# Patient Record
Sex: Female | Born: 1935 | Race: White | Hispanic: No | Marital: Married | Smoking: Never smoker
Health system: Southern US, Community
[De-identification: ages and names within clinical notes are randomized; demographics above are authoritative.]

## PROBLEM LIST (undated history)

## (undated) DIAGNOSIS — Z950 Presence of cardiac pacemaker: Secondary | ICD-10-CM

## (undated) DIAGNOSIS — R011 Cardiac murmur, unspecified: Secondary | ICD-10-CM

## (undated) DIAGNOSIS — I442 Atrioventricular block, complete: Secondary | ICD-10-CM

## (undated) DIAGNOSIS — I10 Essential (primary) hypertension: Secondary | ICD-10-CM

## (undated) DIAGNOSIS — M1990 Unspecified osteoarthritis, unspecified site: Secondary | ICD-10-CM

## (undated) HISTORY — DX: Presence of cardiac pacemaker: Z95.0

## (undated) HISTORY — DX: Atrioventricular block, complete: I44.2

## (undated) HISTORY — PX: ROTATOR CUFF REPAIR: SHX139

## (undated) HISTORY — PX: THYROID SURGERY: SHX805

## (undated) HISTORY — PX: CATARACT EXTRACTION, BILATERAL: SHX1313

## (undated) HISTORY — PX: APPENDECTOMY: SHX54

---

## 2006-10-25 ENCOUNTER — Telehealth: Payer: Self-pay | Admitting: *Deleted

## 2006-10-25 ENCOUNTER — Telehealth: Payer: Self-pay | Admitting: Family Medicine

## 2006-10-31 ENCOUNTER — Ambulatory Visit: Payer: Self-pay | Admitting: Family Medicine

## 2006-10-31 DIAGNOSIS — I10 Essential (primary) hypertension: Secondary | ICD-10-CM | POA: Insufficient documentation

## 2006-10-31 DIAGNOSIS — D126 Benign neoplasm of colon, unspecified: Secondary | ICD-10-CM

## 2006-10-31 DIAGNOSIS — E785 Hyperlipidemia, unspecified: Secondary | ICD-10-CM | POA: Insufficient documentation

## 2006-10-31 LAB — CONVERTED CEMR LAB
ALT: 20 units/L (ref 0–35)
AST: 17 units/L (ref 0–37)
Albumin: 4.4 g/dL (ref 3.5–5.2)
Alkaline Phosphatase: 52 units/L (ref 39–117)
Calcium: 10.2 mg/dL (ref 8.4–10.5)
Creatinine, Ser: 0.95 mg/dL (ref 0.40–1.20)
Glucose, Bld: 101 mg/dL — ABNORMAL HIGH (ref 70–99)
Sodium: 139 meq/L (ref 135–145)

## 2006-11-01 ENCOUNTER — Encounter: Payer: Self-pay | Admitting: Family Medicine

## 2006-11-09 ENCOUNTER — Telehealth: Payer: Self-pay | Admitting: *Deleted

## 2006-11-15 ENCOUNTER — Encounter: Payer: Self-pay | Admitting: Family Medicine

## 2007-01-10 ENCOUNTER — Telehealth: Payer: Self-pay | Admitting: Family Medicine

## 2007-02-15 ENCOUNTER — Encounter: Payer: Self-pay | Admitting: Family Medicine

## 2007-02-15 ENCOUNTER — Ambulatory Visit: Payer: Self-pay | Admitting: Family Medicine

## 2007-02-15 LAB — CONVERTED CEMR LAB
ALT: 17 units/L (ref 0–35)
Alkaline Phosphatase: 54 units/L (ref 39–117)
Cholesterol: 202 mg/dL — ABNORMAL HIGH (ref 0–200)
Total Protein: 7.1 g/dL (ref 6.0–8.3)
Triglycerides: 242 mg/dL — ABNORMAL HIGH (ref <150)

## 2007-02-16 ENCOUNTER — Encounter: Payer: Self-pay | Admitting: Family Medicine

## 2007-03-22 ENCOUNTER — Ambulatory Visit: Payer: Self-pay | Admitting: Family Medicine

## 2007-03-22 DIAGNOSIS — IMO0002 Reserved for concepts with insufficient information to code with codable children: Secondary | ICD-10-CM | POA: Insufficient documentation

## 2007-03-22 DIAGNOSIS — M1710 Unilateral primary osteoarthritis, unspecified knee: Secondary | ICD-10-CM

## 2007-06-21 ENCOUNTER — Ambulatory Visit: Payer: Self-pay | Admitting: Family Medicine

## 2007-06-21 DIAGNOSIS — I6529 Occlusion and stenosis of unspecified carotid artery: Secondary | ICD-10-CM | POA: Insufficient documentation

## 2007-06-21 LAB — CONVERTED CEMR LAB
ALT: 16 units/L (ref 0–35)
AST: 15 units/L (ref 0–37)
Alkaline Phosphatase: 52 units/L (ref 39–117)
CO2: 25 meq/L (ref 19–32)
Chloride: 102 meq/L (ref 96–112)
Creatinine, Ser: 0.91 mg/dL (ref 0.40–1.20)
Total Bilirubin: 0.3 mg/dL (ref 0.3–1.2)

## 2007-06-22 ENCOUNTER — Encounter: Payer: Self-pay | Admitting: Family Medicine

## 2007-06-23 ENCOUNTER — Encounter: Payer: Self-pay | Admitting: Family Medicine

## 2007-06-26 ENCOUNTER — Encounter: Admission: RE | Admit: 2007-06-26 | Discharge: 2007-06-26 | Payer: Self-pay | Admitting: Family Medicine

## 2007-07-04 ENCOUNTER — Encounter: Payer: Self-pay | Admitting: Family Medicine

## 2007-07-20 ENCOUNTER — Telehealth: Payer: Self-pay | Admitting: Family Medicine

## 2007-07-21 ENCOUNTER — Ambulatory Visit: Payer: Self-pay | Admitting: Family Medicine

## 2007-07-21 ENCOUNTER — Encounter: Payer: Self-pay | Admitting: Family Medicine

## 2007-07-24 ENCOUNTER — Telehealth (INDEPENDENT_AMBULATORY_CARE_PROVIDER_SITE_OTHER): Payer: Self-pay | Admitting: Family Medicine

## 2007-08-21 ENCOUNTER — Ambulatory Visit: Payer: Self-pay | Admitting: Family Medicine

## 2007-08-21 ENCOUNTER — Telehealth: Payer: Self-pay | Admitting: *Deleted

## 2007-08-21 DIAGNOSIS — E049 Nontoxic goiter, unspecified: Secondary | ICD-10-CM | POA: Insufficient documentation

## 2007-08-22 ENCOUNTER — Encounter: Admission: RE | Admit: 2007-08-22 | Discharge: 2007-08-22 | Payer: Self-pay | Admitting: Family Medicine

## 2007-10-23 ENCOUNTER — Encounter: Payer: Self-pay | Admitting: Family Medicine

## 2007-10-23 ENCOUNTER — Ambulatory Visit: Payer: Self-pay | Admitting: Family Medicine

## 2007-10-23 LAB — CONVERTED CEMR LAB
ALT: 20 units/L (ref 0–35)
Albumin: 4 g/dL (ref 3.5–5.2)
Bilirubin, Direct: 0.1 mg/dL (ref 0.0–0.3)
Total Bilirubin: 0.4 mg/dL (ref 0.3–1.2)
Total Protein: 6.8 g/dL (ref 6.0–8.3)

## 2007-10-30 ENCOUNTER — Telehealth: Payer: Self-pay | Admitting: Family Medicine

## 2007-10-30 ENCOUNTER — Encounter: Payer: Self-pay | Admitting: Family Medicine

## 2007-12-04 ENCOUNTER — Encounter: Payer: Self-pay | Admitting: Family Medicine

## 2008-02-01 ENCOUNTER — Telehealth: Payer: Self-pay | Admitting: *Deleted

## 2008-02-14 ENCOUNTER — Ambulatory Visit: Payer: Self-pay | Admitting: Family Medicine

## 2008-02-15 ENCOUNTER — Encounter: Payer: Self-pay | Admitting: Family Medicine

## 2008-06-05 ENCOUNTER — Ambulatory Visit: Payer: Self-pay | Admitting: Family Medicine

## 2008-06-05 LAB — CONVERTED CEMR LAB
ALT: 26 units/L (ref 0–35)
AST: 19 units/L (ref 0–37)
CO2: 22 meq/L (ref 19–32)
Chloride: 102 meq/L (ref 96–112)
Creatinine, Ser: 0.89 mg/dL (ref 0.40–1.20)
LDL Cholesterol: 123 mg/dL — ABNORMAL HIGH (ref 0–99)
Potassium: 4.5 meq/L (ref 3.5–5.3)
Sodium: 141 meq/L (ref 135–145)
Total CHOL/HDL Ratio: 4.6
Total Protein: 7.9 g/dL (ref 6.0–8.3)
Triglycerides: 269 mg/dL — ABNORMAL HIGH (ref <150)
VLDL: 54 mg/dL — ABNORMAL HIGH (ref 0–40)

## 2008-06-07 ENCOUNTER — Encounter: Payer: Self-pay | Admitting: Family Medicine

## 2008-06-13 ENCOUNTER — Encounter: Payer: Self-pay | Admitting: Family Medicine

## 2008-06-13 ENCOUNTER — Encounter: Admission: RE | Admit: 2008-06-13 | Discharge: 2008-06-13 | Payer: Self-pay | Admitting: Family Medicine

## 2008-06-18 ENCOUNTER — Encounter: Payer: Self-pay | Admitting: Family Medicine

## 2008-07-09 ENCOUNTER — Other Ambulatory Visit: Admission: RE | Admit: 2008-07-09 | Discharge: 2008-07-09 | Payer: Self-pay | Admitting: Interventional Radiology

## 2008-07-09 ENCOUNTER — Encounter (INDEPENDENT_AMBULATORY_CARE_PROVIDER_SITE_OTHER): Payer: Self-pay | Admitting: Interventional Radiology

## 2008-07-09 ENCOUNTER — Encounter: Admission: RE | Admit: 2008-07-09 | Discharge: 2008-07-09 | Payer: Self-pay | Admitting: Otolaryngology

## 2008-08-09 ENCOUNTER — Telehealth: Payer: Self-pay | Admitting: *Deleted

## 2008-08-12 ENCOUNTER — Ambulatory Visit: Payer: Self-pay | Admitting: Family Medicine

## 2008-10-04 ENCOUNTER — Telehealth: Payer: Self-pay | Admitting: *Deleted

## 2008-10-07 ENCOUNTER — Ambulatory Visit: Payer: Self-pay | Admitting: Family Medicine

## 2008-10-07 ENCOUNTER — Telehealth: Payer: Self-pay | Admitting: Family Medicine

## 2008-10-08 ENCOUNTER — Telehealth: Payer: Self-pay | Admitting: Family Medicine

## 2008-10-14 ENCOUNTER — Encounter: Payer: Self-pay | Admitting: Family Medicine

## 2008-11-01 ENCOUNTER — Encounter: Admission: RE | Admit: 2008-11-01 | Discharge: 2008-11-01 | Payer: Self-pay | Admitting: Family Medicine

## 2008-12-02 ENCOUNTER — Ambulatory Visit: Payer: Self-pay | Admitting: Family Medicine

## 2008-12-24 ENCOUNTER — Telehealth (INDEPENDENT_AMBULATORY_CARE_PROVIDER_SITE_OTHER): Payer: Self-pay | Admitting: *Deleted

## 2008-12-24 ENCOUNTER — Ambulatory Visit: Payer: Self-pay | Admitting: Family Medicine

## 2009-01-10 ENCOUNTER — Telehealth (INDEPENDENT_AMBULATORY_CARE_PROVIDER_SITE_OTHER): Payer: Self-pay | Admitting: *Deleted

## 2009-01-17 ENCOUNTER — Telehealth (INDEPENDENT_AMBULATORY_CARE_PROVIDER_SITE_OTHER): Payer: Self-pay | Admitting: *Deleted

## 2009-01-21 ENCOUNTER — Ambulatory Visit: Payer: Self-pay | Admitting: Family Medicine

## 2009-01-22 ENCOUNTER — Encounter: Payer: Self-pay | Admitting: Family Medicine

## 2009-01-23 ENCOUNTER — Ambulatory Visit: Payer: Self-pay | Admitting: Family Medicine

## 2009-01-24 ENCOUNTER — Encounter: Payer: Self-pay | Admitting: Family Medicine

## 2009-01-24 LAB — CONVERTED CEMR LAB
BUN: 28 mg/dL — ABNORMAL HIGH (ref 6–23)
Potassium: 4 meq/L (ref 3.5–5.3)
Sodium: 140 meq/L (ref 135–145)

## 2009-01-27 ENCOUNTER — Ambulatory Visit (HOSPITAL_COMMUNITY): Admission: RE | Admit: 2009-01-27 | Discharge: 2009-01-27 | Payer: Self-pay | Admitting: Family Medicine

## 2009-01-31 ENCOUNTER — Telehealth (INDEPENDENT_AMBULATORY_CARE_PROVIDER_SITE_OTHER): Payer: Self-pay | Admitting: *Deleted

## 2009-01-31 DIAGNOSIS — N83209 Unspecified ovarian cyst, unspecified side: Secondary | ICD-10-CM | POA: Insufficient documentation

## 2009-02-07 ENCOUNTER — Ambulatory Visit: Payer: Self-pay | Admitting: Family Medicine

## 2009-03-17 ENCOUNTER — Ambulatory Visit: Payer: Self-pay | Admitting: Family Medicine

## 2009-04-23 ENCOUNTER — Ambulatory Visit: Payer: Self-pay | Admitting: Obstetrics & Gynecology

## 2009-05-07 ENCOUNTER — Ambulatory Visit (HOSPITAL_COMMUNITY): Admission: RE | Admit: 2009-05-07 | Discharge: 2009-05-07 | Payer: Self-pay | Admitting: Obstetrics & Gynecology

## 2009-05-14 ENCOUNTER — Ambulatory Visit: Payer: Self-pay | Admitting: Obstetrics and Gynecology

## 2009-05-22 ENCOUNTER — Telehealth: Payer: Self-pay | Admitting: *Deleted

## 2009-05-23 ENCOUNTER — Ambulatory Visit: Payer: Self-pay | Admitting: Sports Medicine

## 2009-06-30 ENCOUNTER — Ambulatory Visit: Payer: Self-pay | Admitting: Family Medicine

## 2009-06-30 ENCOUNTER — Encounter: Payer: Self-pay | Admitting: Family Medicine

## 2009-06-30 LAB — CONVERTED CEMR LAB
BUN: 29 mg/dL — ABNORMAL HIGH (ref 6–23)
CO2: 31 meq/L (ref 19–32)
Cholesterol: 194 mg/dL (ref 0–200)
Creatinine, Ser: 0.89 mg/dL (ref 0.40–1.20)
Glucose, Bld: 76 mg/dL (ref 70–99)
HCT: 38.6 % (ref 36.0–46.0)
LDL Cholesterol: 103 mg/dL — ABNORMAL HIGH (ref 0–99)
MCV: 95.5 fL (ref 78.0–100.0)
Platelets: 460 10*3/uL — ABNORMAL HIGH (ref 150–400)
Total Bilirubin: 0.3 mg/dL (ref 0.3–1.2)
Total CHOL/HDL Ratio: 3.3
Triglycerides: 165 mg/dL — ABNORMAL HIGH (ref <150)
VLDL: 33 mg/dL (ref 0–40)

## 2009-07-03 ENCOUNTER — Encounter: Payer: Self-pay | Admitting: Family Medicine

## 2009-07-16 ENCOUNTER — Ambulatory Visit: Payer: Self-pay | Admitting: Family Medicine

## 2009-07-16 DIAGNOSIS — G4700 Insomnia, unspecified: Secondary | ICD-10-CM

## 2009-08-27 ENCOUNTER — Ambulatory Visit: Payer: Self-pay | Admitting: Family Medicine

## 2009-08-27 ENCOUNTER — Telehealth: Payer: Self-pay | Admitting: *Deleted

## 2009-08-27 IMAGING — US US BIOPSY
1 series · 11 of 11 positions shown · non-contrast
Comparison: none

Clinical Data/Indication: Thyroid nodules

ULTRASOUND-GUIDED BIOPSYDOMINANT RIGHT THYROID NODULE.  FINE NEEDLE
ASPIRATION.
Procedure: The procedure, risks, benefits, and alternatives were
explained to the patient. Questions regarding the procedure were
encouraged and answered. The patient understands and consents to
the procedure.
The right neck was prepped withbetadine in a sterile fasion, and a
sterile drape was applied covering the operative field. A sterile
gown and sterile gloves were used for the procedure.
Under sonographic guidance, three 25 gauge fine needle aspirates of
the dominant right thyroid nodule were obtained. Final imaging was
performed.

[Series 1: us biopsy · 0.08mm/px · 11 acquisitions, 11 frames shown]
[im 1/11]
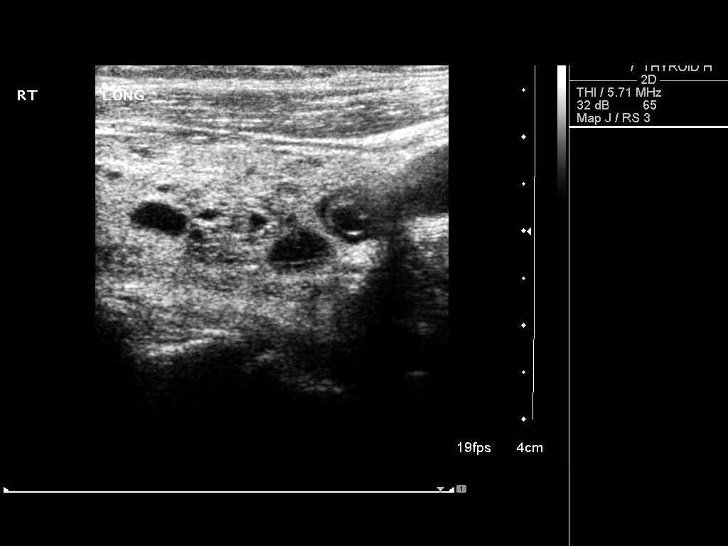
[im 2/11]
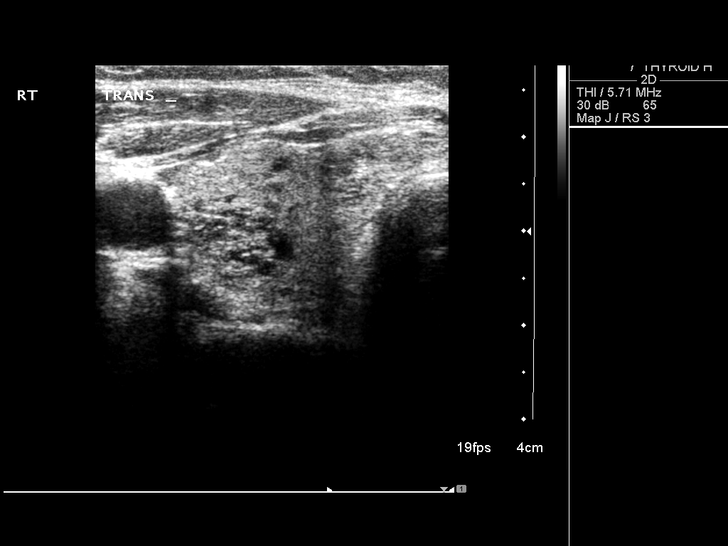
[im 3/11]
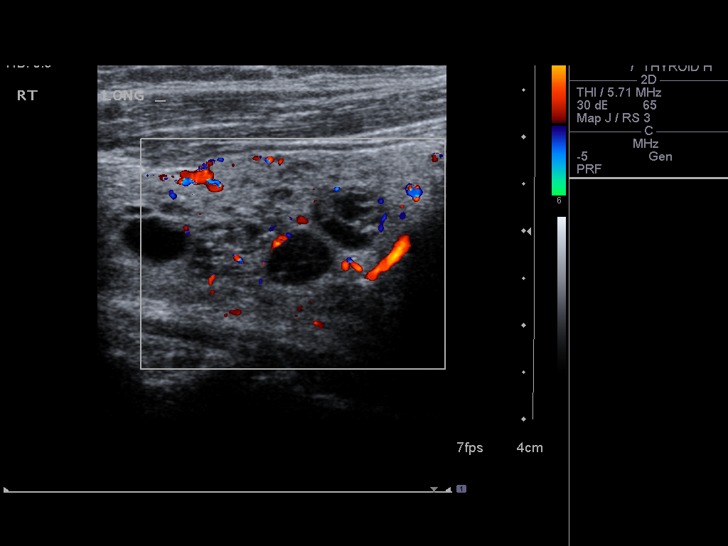
[im 4/11]
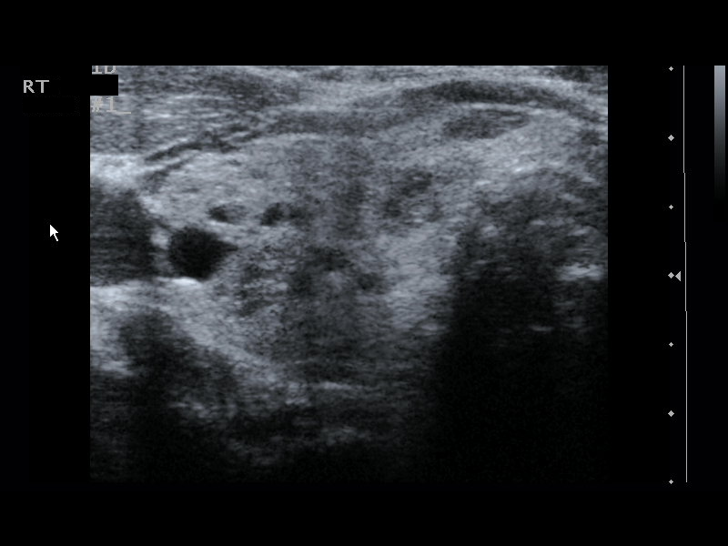
[im 5/11]
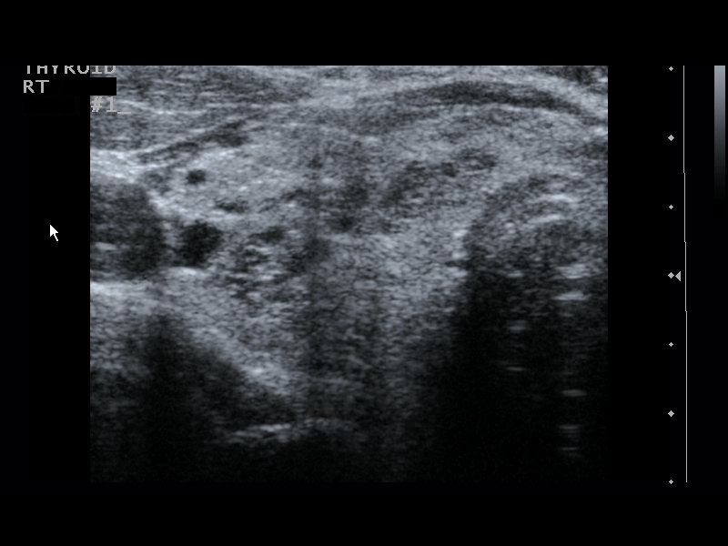
[im 6/11]
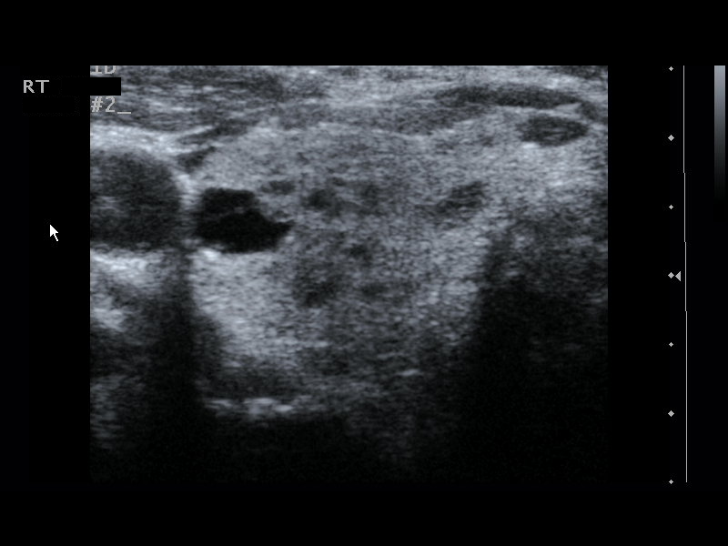
[im 7/11]
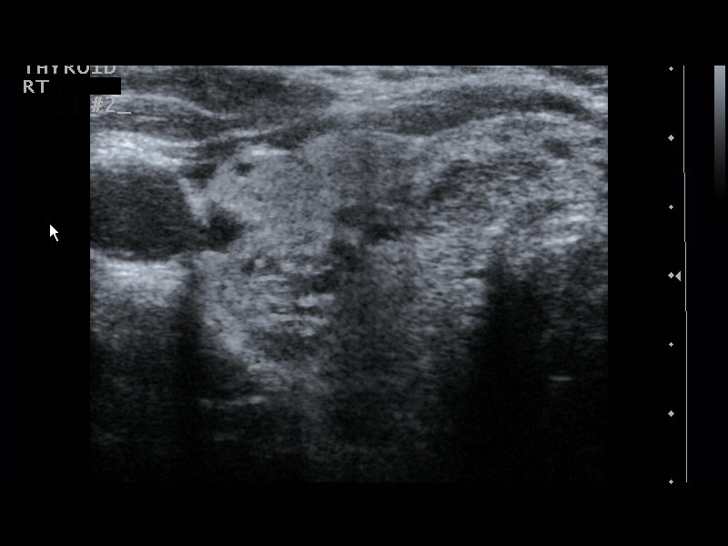
[im 8/11]
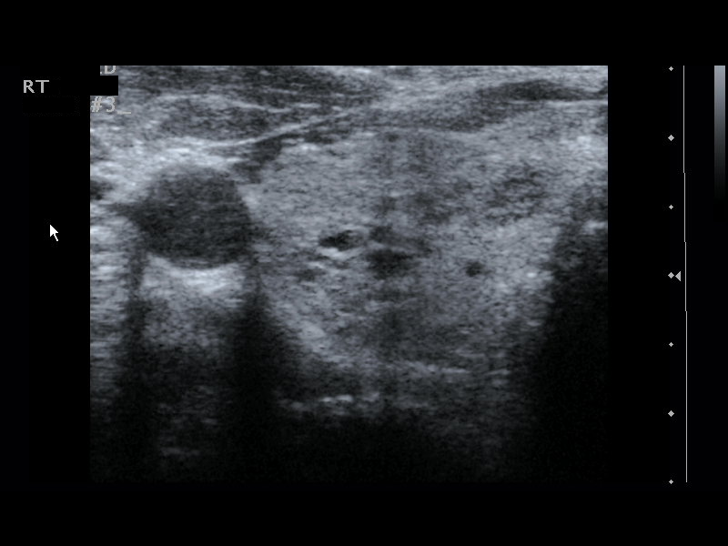
[im 9/11]
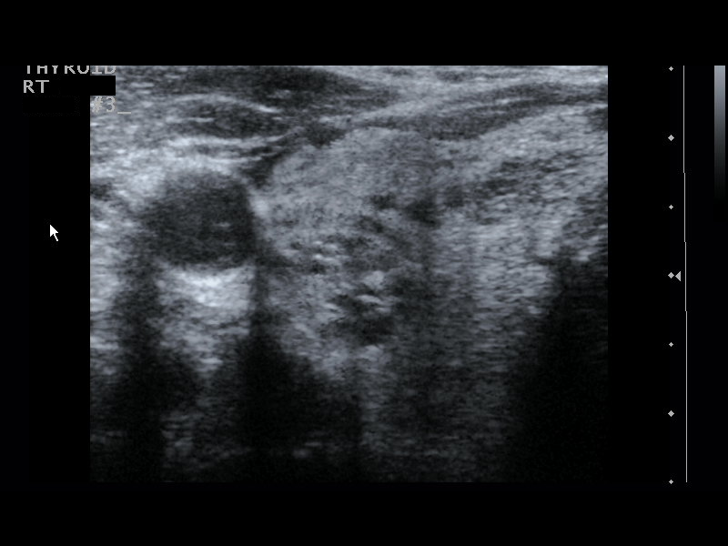
[im 10/11]
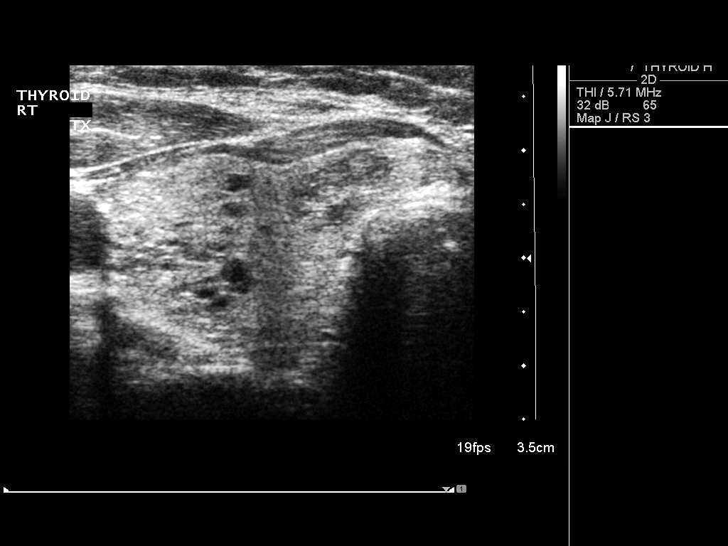
[im 11/11]
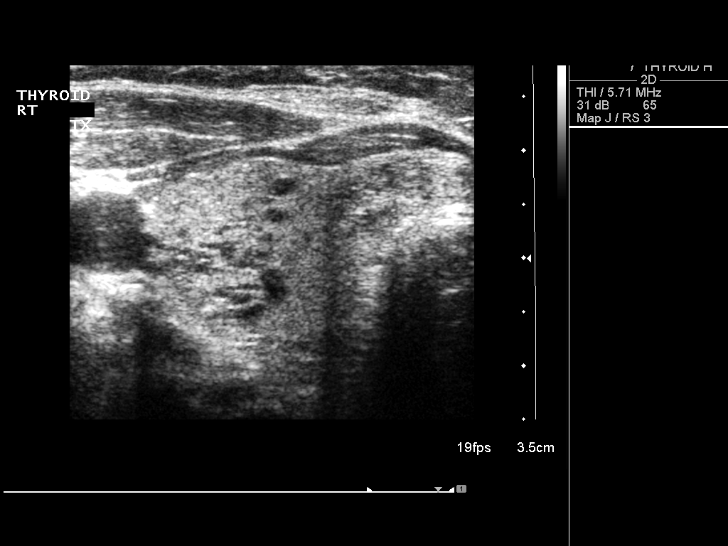

[11 of 11 positions shown; findings below may reference images not displayed]

FINDINGS: The images document guide needle placement within the
dominant right thyroid nodule. Post biopsy images demonstrate no
hemorrhage.
IMPRESSION: Successful ultrasound-guided fine needle aspiration of a dominant
right thyroid nodule.

## 2009-09-15 ENCOUNTER — Ambulatory Visit: Payer: Self-pay | Admitting: Family Medicine

## 2009-09-26 ENCOUNTER — Ambulatory Visit (HOSPITAL_COMMUNITY): Admission: RE | Admit: 2009-09-26 | Discharge: 2009-09-26 | Payer: Self-pay | Admitting: Family Medicine

## 2009-09-30 ENCOUNTER — Encounter: Payer: Self-pay | Admitting: Family Medicine

## 2009-10-06 ENCOUNTER — Telehealth: Payer: Self-pay | Admitting: *Deleted

## 2009-11-17 ENCOUNTER — Ambulatory Visit: Payer: Self-pay | Admitting: Family Medicine

## 2009-12-22 ENCOUNTER — Encounter: Payer: Self-pay | Admitting: Family Medicine

## 2009-12-26 ENCOUNTER — Inpatient Hospital Stay (HOSPITAL_COMMUNITY): Admission: RE | Admit: 2009-12-26 | Discharge: 2009-12-29 | Payer: Self-pay | Admitting: Orthopedic Surgery

## 2010-01-08 ENCOUNTER — Encounter: Payer: Self-pay | Admitting: *Deleted

## 2010-01-09 ENCOUNTER — Telehealth: Payer: Self-pay | Admitting: Family Medicine

## 2010-01-12 ENCOUNTER — Encounter: Payer: Self-pay | Admitting: Family Medicine

## 2010-02-16 ENCOUNTER — Ambulatory Visit: Payer: Self-pay | Admitting: Family Medicine

## 2010-03-04 ENCOUNTER — Telehealth (INDEPENDENT_AMBULATORY_CARE_PROVIDER_SITE_OTHER): Payer: Self-pay | Admitting: *Deleted

## 2010-03-11 ENCOUNTER — Ambulatory Visit: Payer: Self-pay | Admitting: Family Medicine

## 2010-03-11 LAB — CONVERTED CEMR LAB
BUN: 27 mg/dL — ABNORMAL HIGH (ref 6–23)
Basophils Absolute: 0 10*3/uL (ref 0.0–0.1)
CO2: 28 meq/L (ref 19–32)
Calcium: 11 mg/dL — ABNORMAL HIGH (ref 8.4–10.5)
Chloride: 103 meq/L (ref 96–112)
Creatinine, Ser: 0.89 mg/dL (ref 0.40–1.20)
HCT: 39.7 % (ref 36.0–46.0)
Hemoglobin: 11.2 g/dL — ABNORMAL LOW (ref 12.0–15.0)
Lymphocytes Relative: 34 % (ref 12–46)
Lymphs Abs: 3.1 10*3/uL (ref 0.7–4.0)
Monocytes Absolute: 0.9 10*3/uL (ref 0.1–1.0)
Potassium: 4.6 meq/L (ref 3.5–5.3)
RBC: 3.75 M/uL — ABNORMAL LOW (ref 3.87–5.11)
TSH: 2.097 microintl units/mL (ref 0.350–4.500)
WBC: 9 10*3/uL (ref 4.0–10.5)

## 2010-03-18 ENCOUNTER — Encounter: Payer: Self-pay | Admitting: Family Medicine

## 2010-03-19 ENCOUNTER — Encounter: Payer: Self-pay | Admitting: Family Medicine

## 2010-05-27 ENCOUNTER — Encounter
Admission: RE | Admit: 2010-05-27 | Discharge: 2010-05-27 | Payer: Self-pay | Source: Home / Self Care | Attending: Family Medicine | Admitting: Family Medicine

## 2010-06-03 NOTE — Assessment & Plan Note (Signed)
Summary: flu shot,df  Nurse Visit   Vital Signs:  Patient profile:   77 year old female Temp:     12 degrees F  Vitals Entered By: Theresia Lo RN (February 16, 2010 9:25 AM)  Allergies: No Known Drug Allergies  Immunizations Administered:  Influenza Vaccine # 1:    Vaccine Type: Fluvax MCR    Site: right deltoid    Mfr: [employer]    Dose: 0.5 ml    Route: IM    Given by: Theresia Lo RN    Exp. Date: 10/28/2010    Lot #: [id number]    VIS given: 11/25/09 version given February 16, 2010.  Flu Vaccine Consent Questions:    Do you have a history of severe allergic reactions to this vaccine? no    Any prior history of allergic reactions to egg and/or gelatin? no    Do you have a sensitivity to the preservative Thimersol? no    Do you have a past history of Guillan-Barre Syndrome? no    Do you currently have an acute febrile illness? no    Have you ever had a severe reaction to latex? no    Vaccine information given and explained to patient? yes    Are you currently pregnant? no  Orders Added: 1)  Influenza Vaccine MCR [00025] 2)  Administration Flu vaccine - MCR [G0008]

## 2010-06-03 NOTE — Assessment & Plan Note (Signed)
Summary: R/L KNEE COT SHOT,MC   Vital Signs:  Patient profile:   77 year old female Height:      65 inches Weight:      156 pounds BMI:     26.05 Pulse rate:   65 / minute BP sitting:   153 / 74  (left arm)  Vitals Entered By: Terese Door (May 23, 2009 10:34 AM) CC: Bilaterl knee injections Is Patient Diabetic? No Pain Assessment Patient in pain? yes     Location: knee Intensity: 5 right knee/8 left knee   CC:  Bilaterl knee injections.  History of Present Illness: B OA knee pain worse over last 1 week. Did help her grand-daughter move to a new apartmment and was up and down stairs a lot. Last 2 days have been worst--difficulty rising from chair because of excrusiating pain everything else is going well  saw the OBGYN to eval her ovarian cyst and has f/u  in one year.  Habits & Providers  Alcohol-Tobacco-Diet     Tobacco Status: never  Allergies: No Known Drug Allergies  Physical Exam  Msk:  B knees have external changes of OA--and the left seems to have larger amount of synovitis present than before. B medial joint line tenderness. no effusion. Lacks 3-5 degrees of full extension on left, full on right. Additional Exam:  Patient given informed consent for injection. Discussed possible complications of infection, bleeding or skin atrophy at site of injection. Possible side effect of avascular necrosis (focal area of bone death) due to steroid use.Appropriate verbal time out taken Are cleaned and prepped in usual sterile fashion. A ---1- cc kennalog plus --4--cc 1% lidocaine without epinephrine was injected into each knee using the anterior approach---. Patient tolerated procedure well with no complications.    Impression & Recommendations:  Problem # 1:  DEGENERATIVE JOINT DISEASE, KNEE (ICD-715.96) Assessment Deteriorated  B injection therapy today. I think she should probably take it easy on them next couple of days--her synovium seems pretty inflamed esp on  left. She agrrees  Orders: Joint Aspirate / Injection, Large (20610)  Problem # 2:  OVARIAN CYSTIC MASS (ICD-620.2) Assessment: Unchanged OBGYN said just repeat US one year  Complete Medication List: 1)  Pravachol 40 Mg Tabs (Pravastatin sodium) .Marland Kitchen.. 1 by mouth once daily 2)  Vicodin 5-500 Mg Tabs (Hydrocodone-acetaminophen) .... 2 by mouth two times a day as needed hip pain or colon spasm 3)  Anacin 81 Mg Tbec (Aspirin) .... Once daily 4)  Hydrochlorothiazide 25 Mg Tabs (Hydrochlorothiazide) .... Once daily 5)  Naprosyn 500 Mg Tabs (Naproxen) .Marland Kitchen.. 1 two times a day pc prn 6)  Altace 10 Mg Tabs (Ramipril) .... Take one tab two times a day 7)  Flexeril 10 Mg Tabs (Cyclobenzaprine hcl) .... Take one  half to one whole tablet twice as needed a day  for back pain

## 2010-06-03 NOTE — Letter (Signed)
Summary: LAB Letter  [facility]  [address]   [place], [place] [address]   Phone: [phone]  Fax: [phone]    07/03/2009  [facility] [address] [place], [place]  [address]  Dear Ms. Royce,   YOur LDL cholesterol is 103 which is PERFECT. All of the  other labs including blood sugar, kidney function, liver function, electrolytes and thyroid were normal. GREAT!       Sincerely,   Denny Levy MD  Appended Document: LAB Letter mailed.

## 2010-06-03 NOTE — Letter (Signed)
Summary: [facility]  [address]   [place], [place] [address]   Phone: [phone]  Fax: [phone]    09/30/2009  [facility] [address] [place], [place]  [address]  Dear Ms. Mccormack,  Your knee XRAYS show what we expected---very severe osteoarthritis. the left knee is much worse.   I think we do need to have you seen by the orthopedist at your convenience. I cannot remember if you were going to make that appointment or we were. If indeed it was something I  was going to do--please call and leave me that message. Or--if you have problems making the appointment---sometimes they need referral from your regular doctor. Let me know.  Hope you are well.         Sincerely,   Denny Levy MD  Appended Document: Jillyn Hidden Letter mailed

## 2010-06-03 NOTE — Assessment & Plan Note (Signed)
Summary: bp check/eo  Nurse Visit   patient in office for BP check today because she has been getting higher than usual readings off and on. she generally doesn't pay attention but her daughter was concerned today about readings she got at home earlier today , 180 systolic. she denies any symptoms , states she feels fine. BP checked manually using .regular adult cuff. LA 168/70  RA 172/68 pulse 76.   will notify   Dr. Jennette Kettle. Theresia Lo RN  August 27, 2009 11:45 AM [Vital Signs-4-CCC]   consulted with Dr. Jennette Kettle and she advises as long as no symptoms will continue on current meds. BP reading today similar to reading at last visit. follow up as previously planned. Theresia Lo RN  August 27, 2009 11:52 AM   Allergies: No Known Drug Allergies  Orders Added: 1)  No Charge Patient Arrived (NCPA0) [NCPA0]

## 2010-06-03 NOTE — Assessment & Plan Note (Signed)
Summary: persistant nightime HA/[place]/Zackarie Chason(to see Dr. Jennette Kettle @ 3:00)/bmc   Vital Signs:  Patient profile:   77 year old female Height:      65 inches Weight:      155.4 pounds BMI:     25.95 Temp:     98.2 degrees F oral Pulse rate:   75 / minute BP sitting:   169 / 69  (left arm) Cuff size:   regular  Vitals Entered By: Gladstone Pih (July 16, 2009 3:04 PM)  Serial Vital Signs/Assessments:  Time      Position  BP       Pulse  Resp  Temp     By                     145/82                         Denny Levy MD  CC: HA Is Patient Diabetic? No   CC:  HA.  History of Present Illness: insomnia: long hx of difficulty falling asleep and maintaining sleep. Much worse recently--says she just cannot shut off her mind.  New stressor in that her 59 yo grandson lived w them briefly--his Mom kicked him out--ultimately she had to ask him to leave as well--discipline issues. She feels some guilt for this even tho he appears to be doing oK. Said she would never have had to "give up" on one of herr own 6 kids.  Denies difficulty concentrating, no overt depressive thoughts, not anxious, activity and appetite normal. enjouys her life incl 5 yo grandson.  needs some refills on eiother her BP or cholesterol meds--she cannot remember which. BP up some today. she has felt well, no SOB, no chest pain no dizziness. Has had some headaches, worse on nights w insomnia. no visual changes  Habits & Providers  Alcohol-Tobacco-Diet     Tobacco Status: never  Allergies: No Known Drug Allergies  Review of Systems  The patient denies anorexia, weight loss, weight gain, and abdominal pain.         see hpi  Physical Exam  General:  alert, well-developed, and well-nourished.   Lungs:  normal breath sounds.   Heart:  normal rate and regular rhythm.   Psych:  Oriented X3, memory intact for recent and remote, normally interactive, good eye contact, not anxious appearing, not depressed appearing, and not  agitated.     Impression & Recommendations:  Problem # 1:  INSOMNIA (ICD-780.52) Assessment Deteriorated  I suspect this is related to recent stressors. Disussed options--ambien dod not work well for her--do not want trazodone as it has constipating effects in some and her hx of bowel problems, will try low dose seroquel. she will f/u by phone in 1 week  Orders: [facility]- Est  Level 4 ([address]40)  Problem # 2:  ESSENTIAL HYPERTENSION (ICD-401.9)  Her updated medication list for this problem includes:    Hydrochlorothiazide 25 Mg Tabs (Hydrochlorothiazide) ..... Once daily    Altace 10 Mg Tabs (Ramipril) .Marland Kitchen... Take one tab two times a day  Orders: [facility]- Est  Level 4 (98119) initially elevated today I think secondary to stress, some resolution on recheck no med changes  Problem # 3:  HYPERLIPIDEMIA (ICD-272.4)  Her updated medication list for this problem includes:    Pravachol 40 Mg Tabs (Pravastatin sodium) .Marland Kitchen... 1 by mouth once daily  Orders: [facility]- Est  Level 4 (99214) no med hcanges. reviewed recent labs  Complete Medication List: 1)  Pravachol 40 Mg Tabs (Pravastatin sodium) .Marland Kitchen.. 1 by mouth once daily 2)  Vicodin 5-500 Mg Tabs (Hydrocodone-acetaminophen) .... 2 by mouth two times a day as needed hip pain or colon spasm 3)  Anacin 81 Mg Tbec (Aspirin) .... Once daily 4)  Hydrochlorothiazide 25 Mg Tabs (Hydrochlorothiazide) .... Once daily 5)  Naprosyn 500 Mg Tabs (Naproxen) .Marland Kitchen.. 1 two times a day pc prn 6)  Altace 10 Mg Tabs (Ramipril) .... Take one tab two times a day 7)  Flexeril 10 Mg Tabs (Cyclobenzaprine hcl) .... Take one  half to one whole tablet twice as needed a day  for back pain 8)  Seroquel 25 Mg Tabs (Quetiapine fumarate) .... 1/2- 1 tab by mouth qhs Prescriptions: SEROQUEL 25 MG TABS (QUETIAPINE FUMARATE) 1/2- 1 tab by mouth qhs  #30 x 1   Entered and Authorized by:   Denny Levy MD   Signed by:   Denny Levy MD on 07/16/2009   Method used:   Print then Give to  Patient   RxID:   [id number] ALTACE 10 MG TABS (RAMIPRIL) take one tab two times a day  #180 Each x 3   Entered and Authorized by:   Denny Levy MD   Signed by:   Denny Levy MD on 07/16/2009   Method used:   Print then Give to Patient   RxID:   [id number] HYDROCHLOROTHIAZIDE 25 MG  TABS (HYDROCHLOROTHIAZIDE) once daily  #90 x 3   Entered and Authorized by:   Denny Levy MD   Signed by:   Denny Levy MD on 07/16/2009   Method used:   Print then Give to Patient   RxID:   [id number] PRAVACHOL 40 MG  TABS (PRAVASTATIN SODIUM) 1 by mouth once daily  #90 x 3   Entered and Authorized by:   Denny Levy MD   Signed by:   Denny Levy MD on 07/16/2009   Method used:   Print then Give to Patient   RxID:   [id number] HYDROCHLOROTHIAZIDE 25 MG  TABS (HYDROCHLOROTHIAZIDE) once daily  #90 x 3   Entered and Authorized by:   Denny Levy MD   Signed by:   Denny Levy MD on 07/16/2009   Method used:   Electronically to        [facility] [address] (retail)       [address] [place], [place]  [address]       Ph: [phone] or [phone]       Fax: [phone]   RxID:   [id number] ALTACE 10 MG TABS (RAMIPRIL) take one tab two times a day  #180 Each x 3   Entered and Authorized by:   Denny Levy MD   Signed by:   Denny Levy MD on 07/16/2009   Method used:   Electronically to        [facility] [address] (retail)       [address] [place], [place]  [address]       Ph: [phone] or [phone]       Fax: [phone]   RxID:   [id number] PRAVACHOL 40 MG  TABS (PRAVASTATIN SODIUM) 1 by mouth once daily  #90 x 3   Entered and Authorized by:   Denny Levy MD   Signed  by:   Denny Levy MD on 07/16/2009   Method used:   Electronically to        [facility] [address] (retail)       [address] [place], [place]  [address]       Ph: [phone] or [phone]       Fax: [phone]   RxID:    [id number] SEROQUEL 25 MG TABS (QUETIAPINE FUMARATE) 1/2- 1 tab by mouth qhs  #30 x 1   Entered and Authorized by:   Denny Levy MD   Signed by:   Denny Levy MD on 07/16/2009   Method used:   Electronically to        [facility] [address] (retail)       [address] [place], [place]  [address]       Ph: [phone] or [phone]       Fax: [phone]   RxID:   [id number]

## 2010-06-03 NOTE — Miscellaneous (Signed)
Summary: insommnia  Clinical Lists Changes spouse called about her insommnia. states she has been taking otc meds w/o relief. appt today at 3:30 with Dr. Lelon Perla. husband has 3pm appt with [facility].Golden Circle RN  July 16, 2009 10:32 AM

## 2010-06-03 NOTE — Consult Note (Signed)
Summary: [facility]  [facility]   Imported By: Clydell Hakim 01/21/2010 11:49:27  _____________________________________________________________________  External Attachment:    Type:   Image     Comment:   External Document

## 2010-06-03 NOTE — Assessment & Plan Note (Signed)
Summary: body aches,df   Vital Signs:  Patient profile:   77 year old female Weight:      148.7 pounds Temp:     98 degrees F oral Pulse rate:   87 / minute Pulse rhythm:   regular BP sitting:   163 / 81  (left arm) Cuff size:   regular  Vitals Entered By: Loralee Pacas CMA (March 11, 2010 8:58 AM) CC: follow-up visit Comments pt has been having body aches and has lost her appetite   CC:  follow-up visit.  History of Present Illness: 1) multiple areas of body ache---saysthe only thing that does NOT hurt is her new left knee. that is doing well.  HAs been using her vicodin which helps some.  2) appetite way down---food does not sound good or taste good. This seemed to get significantly worse after her surgery  3) insomnia is better iwth the seroquel and she is less "moody" too.  Habits & Providers  Alcohol-Tobacco-Diet     Tobacco Status: never  Exercise-Depression-Behavior     Does Patient Exercise: no     Exercise Counseling: to improve exercise regimen     Have you felt down or hopeless? no     Have you felt little pleasure in things? no     Depression Counseling: not indicated; screening negative for depression     Seat Belt Use: always  Current Medications (verified): 1)  Vicodin 5-500 Mg  Tabs (Hydrocodone-Acetaminophen) .... 2 By Mouth Three Times A Day As Needed Hip Pain or Colon Spasm 2)  Anacin 81 Mg  Tbec (Aspirin) .... Once Daily 3)  Hydrochlorothiazide 25 Mg  Tabs (Hydrochlorothiazide) .... Once Daily 4)  Naprosyn 500 Mg Tabs (Naproxen) .Marland Kitchen.. 1 Two Times A Day Pc Prn 5)  Altace 10 Mg Tabs (Ramipril) .... Take One Tab Two Times A Day 6)  Flexeril 10 Mg Tabs (Cyclobenzaprine Hcl) .... Take One  Half To One Whole Tablet Twice As Needed A Day  For Back Pain 7)  Zofran 4 Mg Tabs (Ondansetron Hcl) .Marland Kitchen.. 1 By Mouth Q 6 Hrs As Needed Nausea 8)  Remeron 15 Mg Tabs (Mirtazapine) .Marland Kitchen.. 1 By Mouth Qhs  Allergies: No Known Drug Allergies  Social History: Does  Patient Exercise:  no Seat Belt Use:  always  Physical Exam  General:  alert, well-developed, well-nourished, and well-hydrated.   Eyes:  pupils equal and pupils round.  conjunctiva nonicteric Mouth:  pharynx pink and moist.   Neck:  supple, full ROM, no masses, and no thyromegaly.   Lungs:  normal respiratory effort and normal breath sounds.   Heart:  normal rate, regular rhythm, and no murmur.   Abdomen:  soft, non-tender, and normal bowel sounds.   Msk:  normal ROM.  Left TKR site looks great Neurologic:  alert & oriented X3 and sensation intact to light touch exceopt latearl lleft kneealert & oriented X3, sensation intact to light touch, and DTRs symmetrical and normal.   Psych:  Oriented X3, memory intact for recent and remote, normally interactive, good eye contact, not anxious appearing, and not depressed appearing.     Impression & Recommendations:  Problem # 1:  WEIGHT LOSS, ABNORMAL (ICD-783.21)  given her anorexia as well, I suspect this is from not eating as she should. She did just have a major surgery--could also be her increased use of pain meds  Unclear where the myalgias are coming from--will stop her pravastatin for now. rtc 25m. I will keeop her at  current pain med dose. Did have ovarian mass of undetermined sugnificance so will just check ca 125.  Orders: [facility]- Est  Level 4 (81191)  Problem # 2:  INSOMNIA (ICD-780.52)  as she is sleeping some better I think continueing her on a psychotropic med may be useful---she is no having nearlythe problems now she was a few months ago--will stop the seroquel--try remeron--hopefulyy get some mood stabilization and appetite help as well as help her insiomnia. rtc 1 m  Orders: [facility]- Est  Level 4 (47829)  Problem # 3:  OVARIAN CYSTIC MASS (ICD-620.2)  check ca 125  Orders: [facility]- Est  Level 4 (56213)  Problem # 4:  GOITER, UNSPECIFIED (ICD-240.9)  Orders: Comp Met-[facility] ([address]657-84696) CBC w/Diff-[facility] (29528) TSH-[facility]  ([address]24-401[address]) [facility]- Est  Level 4 (72536)  Complete Medication List: 1)  Vicodin 5-500 Mg Tabs (Hydrocodone-acetaminophen) .... 2 by mouth three times a day as needed hip pain or colon spasm 2)  Anacin 81 Mg Tbec (Aspirin) .... Once daily 3)  Hydrochlorothiazide 25 Mg Tabs (Hydrochlorothiazide) .... Once daily 4)  Naprosyn 500 Mg Tabs (Naproxen) .Marland Kitchen.. 1 two times a day pc prn 5)  Altace 10 Mg Tabs (Ramipril) .... Take one tab two times a day 6)  Flexeril 10 Mg Tabs (Cyclobenzaprine hcl) .... Take one  half to one whole tablet twice as needed a day  for back pain 7)  Zofran 4 Mg Tabs (Ondansetron hcl) .Marland Kitchen.. 1 by mouth q 6 hrs as needed nausea 8)  Remeron 15 Mg Tabs (Mirtazapine) .Marland Kitchen.. 1 by mouth qhs  Other Orders: Miscellaneous Lab Charge-[facility] (64403) Prescriptions: VICODIN 5-500 MG  TABS (HYDROCODONE-ACETAMINOPHEN) 2 by mouth three times a day as needed hip pain or colon spasm  #180 x 5   Entered and Authorized by:   Denny Levy MD   Signed by:   Denny Levy MD on 03/11/2010   Method used:   Telephoned to ...       [facility] [address] (retail)       [address]       [place], [place]  [address]       Ph: [phone]       Fax: [phone]   RxID:   [id number] REMERON 15 MG TABS (MIRTAZAPINE) 1 by mouth qhs  #30 x 3   Entered and Authorized by:   Denny Levy MD   Signed by:   Denny Levy MD on 03/11/2010   Method used:   Electronically to        [facility] [address] (retail)       [address]       [place], [place]  [address]       Ph: [phone]       Fax: [phone]   RxID:   [id number]    Orders Added: 1)  Comp Met-[facility] [27035-00938] 2)  CBC w/Diff-[facility] [85025] 3)  TSH-[facility] [18299-37169] 4)  Miscellaneous Lab Charge-[facility] [99999] 5)  [facility]- Est  Level 4 [67893]    Prevention & Chronic Care Immunizations   Influenza vaccine: Fluvax MCR  (02/16/2010)   Influenza vaccine due: 02/13/2009    Tetanus booster: 05/05/1999: given   Tetanus booster due: 05/04/2009    Pneumococcal vaccine: given   (05/05/1999)   Pneumococcal vaccine due: None    H. zoster vaccine: Not documented  Colorectal Screening   Hemoccult: gets colonoscopy  (06/05/2008)   Hemoccult due: Not Indicated    Colonoscopy: due in 2011  (11/03/2004)   Colonoscopy due: 11/03/2009  Other  Screening   Pap smear: not indicated due to age  (06/05/2008)   Pap smear due: Not Indicated    Mammogram: ASSESSMENT: Negative - BI-RADS 1^MM DIGITAL SCREENING  (11/01/2008)   Mammogram due: 08/2008    DXA bone density scan: Not documented   Smoking status: never  (03/11/2010)  Lipids   Total Cholesterol: 194  (06/30/2009)   LDL: 103  (06/30/2009)   LDL Direct: 114  (06/21/2007)   HDL: 58  (06/30/2009)   Triglycerides: 165  (06/30/2009)    SGOT (AST): 15  (06/30/2009)   SGPT (ALT): 19  (06/30/2009) CMP ordered    Alkaline phosphatase: 39  (06/30/2009)   Total bilirubin: 0.3  (06/30/2009)  Hypertension   Last Blood Pressure: 163 / 81  (03/11/2010)   Serum creatinine: 0.89  (06/30/2009)   Serum potassium 4.4  (06/30/2009) CMP ordered   Self-Management Support :    Hypertension self-management support: Not documented    Lipid self-management support: Not documented

## 2010-06-03 NOTE — Miscellaneous (Signed)
Summary: call from [facility] rn  Clinical Lists Changes Shelley Black, a RN with [facility] called & asked that we call her back about this pt. [phone]. I called & LM asking her to call back.Golden Circle RN  January 08, 2010 11:21 AM  L  total knee 12/26/09. today she was lightheaded & weak. SOB. went back to bed. bp 146/56 sitting 120/50 p. 104 O287%. standing 90/50 p114 . she repeated orthostatics & got the same numbers. .vomited Tuesday. c/o nausea off & on. taking percocet for pain. Not eating well. told her I will send this to pcp & call her with response. Golden Circle RN  January 08, 2010 12:05 PM  Kennon Rounds I can call in somew Zofran--if she is not geting better and able to eat--we need ot see her. PUSH fluids--I think the percocette maybe makiingher nauseated some too. Donnetta Hail  Medications: Added new medication of ZOFRAN 4 MG TABS (ONDANSETRON HCL) 1 by mouth q 6 hrs as needed nausea - Signed Rx of ZOFRAN 4 MG TABS (ONDANSETRON HCL) 1 by mouth q 6 hrs as needed nausea;  #30 x 1;  Signed;  Entered by: Golden Circle RN;  Authorized by: Denny Levy MD;  Method used: Electronically to [id number] [address], [place], [place]  [address], Ph: [phone], Fax: [phone]  spoke with Dr. Jennette Kettle & called pt's home. she is alert & moving around home well using her walker. states nothing tastes good & her mouth is dry. explained how pain meds will make her mouth dry . urged her to take sips of water between bites. she drinks 4 lg glasses of milk per day & 4 of water. no other beverages. told her to try to eat. states she could eat some rice pudding. spouse will go get it. told her md had sent rx for zofran which should help.  told her if unable to eat or drink, will need to be seen here. states she cannot get out & she will try to eat. she has vicodin at home & is going to try that instead of the percocet. c/o dizziness & feeling like she was going to faint when standing. told her food & lots of fluids will help that.  to make sure to use walker & have spouse with her when standing or walking.  reviewed constipation issues. she will get colace or store brand of this & take daily. already taking miralax daily. to call back if any further concerns.Golden Circle RN  January 08, 2010 2:10 PM     Prescriptions: ZOFRAN 4 MG TABS (ONDANSETRON HCL) 1 by mouth q 6 hrs as needed nausea  #30 x 1   Entered by:   Golden Circle RN   Authorized by:   Denny Levy MD   Signed by:   Golden Circle RN on 01/08/2010   Method used:   Electronically to        [id number] (retail)       [address]       [place], [place]  [address]       Ph: [phone]       Fax: [phone]   RxID:   [id number]

## 2010-06-03 NOTE — Letter (Signed)
Summary: LAB Letter  [facility]  [address]   [place], [place] [address]   Phone: [phone]  Fax: [phone]    03/18/2010  [facility] [address] [place], [place]  [address]  Dear Shelley Black,  All of your labs were pretty normal. I even did a weird test for tumor since you had histroy of an ovarian mass / cyst in the past.... normal too.   Nothing here to explain your weight loss. Let's see what the remeron does. I do know that some patients lose their appetite when they have a major surgery and use pain meds for a while; something about the pain med knocks out appetite.  Will plan to see you back as we discussed. Remember to tell your husband that if he (or you) need to be seen, just call and leave me that message. We can work something out with scheduleing---especially around [employer] schedule as I know he is hopping!  I am out of town until Monday 21st.         Sincerely,   Denny Levy MD  Appended Document: LAB Letter mailed

## 2010-06-03 NOTE — Progress Notes (Signed)
Summary: high bp  Phone Note Call from Patient Call back at [facility] Phone [phone]   Caller: Patient Summary of Call: BP is still running high and is 180/? Initial call taken by: De Nurse,  August 27, 2009 9:31 AM  Follow-up for Phone Call        she checked it at a store the other night. denies symptoms. had just taken bp meds less than 1 hr before. told her bps vary & store machine may not be well calibrated. asked her to come in to get her pressure checked here Follow-up by: Golden Circle RN,  August 27, 2009 9:44 AM     TRIAGE--agree w BP check here Thanks!  Denny Levy MD  August 27, 2009 11:17 AM

## 2010-06-03 NOTE — Assessment & Plan Note (Signed)
Summary: KNEE PAIN/INJECTION/MJD   Vital Signs:  Patient profile:   77 year old female BP sitting:   147 / 78  Vitals Entered By: Lillia Pauls CMA (November 17, 2009 4:01 PM)  History of Present Illness: f/u B L>R knee pain. Says she "waited too long" this time to come in for a shot. Pain is substantial (7-8/10)--constant. Both knees, left worse. Climbing stairs is the most problematic activity but pain with walkiing and some night pain as well. using pain ipills at night--moderate relief--does not like having to take them--does not really want anything stronger.  Saw Dr Luiz Blare and has been considering the shots (viscosupplementation) vs TKR. Said she has just about decided on TKR as her understanding of "shots" would be at best 18 months of improvement and then she would be a year and a half older and looking at TKR--and " I am not a spring chicken!"    Current Medications (verified): 1)  Pravachol 40 Mg  Tabs (Pravastatin Sodium) .Marland Kitchen.. 1 By Mouth Once Daily 2)  Vicodin 5-500 Mg  Tabs (Hydrocodone-Acetaminophen) .... 2 By Mouth Two Times A Day As Needed Hip Pain or Colon Spasm 3)  Anacin 81 Mg  Tbec (Aspirin) .... Once Daily 4)  Hydrochlorothiazide 25 Mg  Tabs (Hydrochlorothiazide) .... Once Daily 5)  Naprosyn 500 Mg Tabs (Naproxen) .Marland Kitchen.. 1 Two Times A Day Pc Prn 6)  Altace 10 Mg Tabs (Ramipril) .... Take One Tab Two Times A Day 7)  Flexeril 10 Mg Tabs (Cyclobenzaprine Hcl) .... Take One  Half To One Whole Tablet Twice As Needed A Day  For Back Pain 8)  Seroquel 25 Mg Tabs (Quetiapine Fumarate) .... 1/2- 1 Tab By Mouth Qhs  Allergies: No Known Drug Allergies  Review of Systems       Please see HPI for additional ROS.   Physical Exam  General:  alert, well-developed, well-nourished, and well-hydrated.   Msk:  B knees external changes of OA with boggy deformed synovium. Medial and lateral jointline tenderness left, medial JLT on right.   Lacks full extension by 5 degrees on right,  10 -15 on left  Claves are soft, nontender and distally she is neurovascularly intact.    SKIN: over knees is not warm or erythematous, no lesions. Additional Exam:  Patient given informed consent for injection. Discussed possible complications of infection, bleeding or skin atrophy at site of injection. Possible side effect of avascular necrosis (focal area of bone death) due to steroid use  .Appropriate verbal time out taken for each knee. Are cleaned and prepped in usual sterile fashion. A --1-- cc kennalog plus --4--cc 1% lidocaine without epinephrine was injected into each knee using the anterior approach.---. Patient tolerated procedure well with no complications.    Impression & Recommendations:  Problem # 1:  DEGENERATIVE JOINT DISEASE, KNEE (ICD-715.96)  B knee injections Discussed her surgical vs other options. I think she is going to go ahead with TKR and she has appt with Orthopedist (Dr Luiz Blare) In August---she is trying to work around some issues wit her daughter's health needs. rtc prn  Orders: Joint Aspirate / Injection, Large (20610)  Complete Medication List: 1)  Pravachol 40 Mg Tabs (Pravastatin sodium) .Marland Kitchen.. 1 by mouth once daily 2)  Vicodin 5-500 Mg Tabs (Hydrocodone-acetaminophen) .... 2 by mouth two times a day as needed hip pain or colon spasm 3)  Anacin 81 Mg Tbec (Aspirin) .... Once daily 4)  Hydrochlorothiazide 25 Mg Tabs (Hydrochlorothiazide) .... Once daily 5)  Naprosyn 500 Mg Tabs (Naproxen) .Marland Kitchen.. 1 two times a day pc prn 6)  Altace 10 Mg Tabs (Ramipril) .... Take one tab two times a day 7)  Flexeril 10 Mg Tabs (Cyclobenzaprine hcl) .... Take one  half to one whole tablet twice as needed a day  for back pain 8)  Seroquel 25 Mg Tabs (Quetiapine fumarate) .... 1/2- 1 tab by mouth qhs

## 2010-06-03 NOTE — Progress Notes (Signed)
Summary: refill  Phone Note Refill Request Call back at [phone] Message from:  Konrad Dolores  Refills Requested: Medication #1:  VICODIN 5-500 MG  TABS 2 by mouth two times a day as needed hip pain or colon spasm Initial call taken by: De Nurse,  March 04, 2010 10:12 AM  Follow-up for Phone Call        RX called to pharmacy. Follow-up by: Theresia Lo RN,  March 04, 2010 2:51 PM    Prescriptions: VICODIN 5-500 MG  TABS (HYDROCODONE-ACETAMINOPHEN) 2 by mouth two times a day as needed hip pain or colon spasm  #120 x 5   Entered and Authorized by:   Denny Levy MD   Signed by:   Denny Levy MD on 03/04/2010   Method used:   Telephoned to ...       [id number] (retail)       [address]       [place], [place]  [address]       Ph: [phone] or [phone]       Fax: [phone]   RxID:   [id number]   DEAR WHITE TEAM or triage please call in as above Thanks!  Denny Levy MD  March 04, 2010 2:02 PM

## 2010-06-03 NOTE — Progress Notes (Signed)
  Phone Note Outgoing Call   Summary of Call: Kennon Rounds when u have time can u call and see if she is better? Thanks!  Denny Levy MD  January 09, 2010 9:55 AM   Follow-up for Phone Call        spoke with spouse. states her knee is still bothering her. ate peaches & rice pudding & 2 slices of meatloaf yesterday. drinking more. did not c/o dizziness yesterday. home PT is coming out at 10:30 today. she has been doing the home exercises & is c/o voiding every half hour!   overall he thinks she is better. told him we are closed this pm & to call our md on call for any questions or problems  Follow-up by: Golden Circle RN,  January 09, 2010 9:59 AM  Additional Follow-up for Phone Call Additional follow up Details #1::        Thanks!  Denny Levy MD  January 09, 2010 10:06 AM

## 2010-06-03 NOTE — Assessment & Plan Note (Signed)
Summary: 3:00-INJECTION IN [facility]   Vital Signs:  Patient profile:   77 year old female BP sitting:   165 / 67  Vitals Entered By: Lillia Pauls CMA (Sep 15, 2009 3:06 PM)  History of Present Illness: B knee pain, left  much worse than right she has questions about how long she can recieve shots, when she should start really considering surgery, what she can do additionally  for pain as the two times a day vicodin is no longer helping enough. Pain worse with any movement, greatly hindering her activities, worse with rainy days. No locking or giving way  Has noticed more swelling of left knee.  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    Physical Exam  General:  alert and well-developed.   Msk:  left knee has a lot of synovial swelling especially media;lly, TTP medial and lateral joint line. No effusion no erythema. ligamentously intact. fuyll extension and flexion B  knees. Right knee is TTP medial joint line, no erythema, no obvious swelling but some chronic external changes of OA. calves are soft B. distally NV intact Rises from table /chair with some difficulty secondary to knee pain and stiffness Additional Exam:  Patient given informed consent for injection. Discussed possible complications of infection, bleeding or skin atrophy at site of injection. Possible side effect of avascular necrosis (focal area of bone death) due to steroid use.Appropriate verbal time out taken Are  cleaned and prepped in usual sterile fashion. A ---1- cc kennalog plus 4----cc 1% lidocaine without epinephrine was injected into each knee using anterior approach---. Patient tolerated procedure well with no complications.    Impression & Recommendations:  Problem # 1:  DEGENERATIVE JOINT DISEASE, KNEE (ICD-715.96)  Orders: Radiology other (Radiology Other) B knee injections x ray to eval where we are with knees--I think she probably does need to consider TKR--she has been adamantly against it in past---will increase her narcotic coverage, gave her permanenet hadicap sticker. Will call her after x ray and probably rec ortho for eval  Complete Medication List: 1)  Pravachol 40 Mg Tabs (Pravastatin sodium) .Marland Kitchen.. 1 by mouth once daily 2)  Vicodin 5-500 Mg Tabs (Hydrocodone-acetaminophen) .... 2 by mouth two times a day as needed hip pain or colon spasm 3)  Anacin 81 Mg Tbec (Aspirin) .... Once daily 4)  Hydrochlorothiazide 25 Mg Tabs (Hydrochlorothiazide) .... Once daily 5)  Naprosyn 500 Mg Tabs (Naproxen) .Marland Kitchen.. 1 two times a day pc prn 6)  Altace 10 Mg Tabs (Ramipril) .... Take one tab two times a day 7)  Flexeril 10 Mg Tabs (Cyclobenzaprine hcl) .... Take one  half to one whole tablet twice as needed a day  for back pain 8)  Seroquel 25 Mg Tabs (Quetiapine fumarate) .... 1/2- 1 tab by mouth qhs  Other Orders: Joint Aspirate / Injection, Large (16109) Kenalog 10 mg inj (U0454)

## 2010-06-03 NOTE — Consult Note (Signed)
Summary: [facility]   Imported By: De Nurse 01/13/2010 12:02:21  _____________________________________________________________________  External Attachment:    Type:   Image     Comment:   External Document

## 2010-06-03 NOTE — Progress Notes (Signed)
Summary: Referral  Phone Note Call from Patient Call back at [phone]   Reason for Call: Referral Summary of Call: pt is requesting an orthopaedic surgeon referral Initial call taken by: Knox Royalty,  October 06, 2009 9:24 AM  Follow-up for Phone Call        pt notified of appt Follow-up by: Loralee Pacas CMA,  October 07, 2009 3:36 PM    forward to pcp.Loralee Pacas CMA  October 06, 2009 10:58 AM   Complete Medication List: 1)  Pravachol 40 Mg Tabs (Pravastatin sodium) .Marland Kitchen.. 1 by mouth once daily 2)  Vicodin 5-500 Mg Tabs (Hydrocodone-acetaminophen) .... 2 by mouth two times a day as needed hip pain or colon spasm 3)  Anacin 81 Mg Tbec (Aspirin) .... Once daily 4)  Hydrochlorothiazide 25 Mg Tabs (Hydrochlorothiazide) .... Once daily 5)  Naprosyn 500 Mg Tabs (Naproxen) .Marland Kitchen.. 1 two times a day pc prn 6)  Altace 10 Mg Tabs (Ramipril) .... Take one tab two times a day 7)  Flexeril 10 Mg Tabs (Cyclobenzaprine hcl) .... Take one  half to one whole tablet twice as needed a day  for back pain 8)  Seroquel 25 Mg Tabs (Quetiapine fumarate) .... 1/2- 1 tab by mouth qhs

## 2010-06-03 NOTE — Progress Notes (Signed)
Summary: Rx Req  Phone Note Refill Request Call back at Home Phone [phone] Message from:  Patient  Refills Requested: Medication #1:  VICODIN 5-500 MG  TABS 2 by mouth two times a day as needed hip pain or colon spasm WOULD LIKE TO PICK UP TOMORROW HAS APPT TOMORROW FOR BLOOD TEST.    Initial call taken by: Clydell Hakim,  May 22, 2009 10:33 AM  Follow-up for Phone Call        will forward to MD. Follow-up by: Theresia Lo RN,  May 22, 2009 11:07 AM  Additional Follow-up for Phone Call Additional follow up Details #1::        Called and informed pt that Rx will be at the front desk for her to pick up she has an appt to have labs so she will get it in the morning. Additional Follow-up by: Loralee Pacas CMA,  May 22, 2009 12:31 PM    Prescriptions: VICODIN 5-500 MG  TABS (HYDROCODONE-ACETAMINOPHEN) 2 by mouth two times a day as needed hip pain or colon spasm  #120 x 5   Entered and Authorized by:   Denny Levy MD   Signed by:   Denny Levy MD on 05/22/2009   Method used:   Print then Give to Patient   RxID:   [id number]  ready for pick up gave to white team

## 2010-06-04 NOTE — Consult Note (Signed)
Summary: [facility]   Imported By: Knox Royalty 04/15/2010 10:31:31  _____________________________________________________________________  External Attachment:    Type:   Image     Comment:   External Document

## 2010-07-04 ENCOUNTER — Other Ambulatory Visit: Payer: Self-pay | Admitting: Family Medicine

## 2010-07-06 NOTE — Telephone Encounter (Signed)
Refill request

## 2010-07-08 ENCOUNTER — Other Ambulatory Visit: Payer: Self-pay | Admitting: Family Medicine

## 2010-07-08 MED ORDER — HYDROCHLOROTHIAZIDE 25 MG PO TABS: 25.0000 mg | ORAL_TABLET | Freq: Every day | ORAL | Status: DC

## 2010-07-14 ENCOUNTER — Encounter: Payer: Self-pay | Admitting: Home Health Services

## 2010-07-17 LAB — BASIC METABOLIC PANEL
BUN: 3 mg/dL — ABNORMAL LOW (ref 6–23)
BUN: 5 mg/dL — ABNORMAL LOW (ref 6–23)
CO2: 29 mEq/L (ref 19–32)
Calcium: 7.9 mg/dL — ABNORMAL LOW (ref 8.4–10.5)
Calcium: 8.1 mg/dL — ABNORMAL LOW (ref 8.4–10.5)
Creatinine, Ser: 0.59 mg/dL (ref 0.4–1.2)
Creatinine, Ser: 0.81 mg/dL (ref 0.4–1.2)
GFR calc Af Amer: >60 mL/min (ref >60)
GFR calc non Af Amer: >60 mL/min (ref >60)
GFR calc non Af Amer: >60 mL/min (ref >60)
Glucose, Bld: 140 mg/dL — ABNORMAL HIGH (ref 70–99)

## 2010-07-17 LAB — CBC
Hemoglobin: 13.1 g/dL (ref 12.0–15.0)
MCH: 31.3 pg (ref 26.0–34.0)
MCH: 32.4 pg (ref 26.0–34.0)
MCHC: 33.4 g/dL (ref 30.0–36.0)
MCHC: 34.3 g/dL (ref 30.0–36.0)
MCV: 90.4 fL (ref 78.0–100.0)
MCV: 94.8 fL (ref 78.0–100.0)
Platelets: 339 10*3/uL (ref 150–400)
Platelets: 340 10*3/uL (ref 150–400)
Platelets: 341 10*3/uL (ref 150–400)
RBC: 3.19 MIL/uL — ABNORMAL LOW (ref 3.87–5.11)
RDW: 13.5 % (ref 11.5–15.5)
RDW: 13.6 % (ref 11.5–15.5)
WBC: 9.8 10*3/uL (ref 4.0–10.5)

## 2010-07-17 LAB — COMPREHENSIVE METABOLIC PANEL
AST: 21 U/L (ref 0–37)
BUN: 27 mg/dL — ABNORMAL HIGH (ref 6–23)
CO2: 32 mEq/L (ref 19–32)
Calcium: 10.3 mg/dL (ref 8.4–10.5)
Chloride: 100 mEq/L (ref 96–112)
Creatinine, Ser: 0.88 mg/dL (ref 0.4–1.2)
GFR calc Af Amer: >60 mL/min (ref >60)
GFR calc non Af Amer: >60 mL/min (ref >60)
Glucose, Bld: 84 mg/dL (ref 70–99)
Total Bilirubin: 0.3 mg/dL (ref 0.3–1.2)

## 2010-07-17 LAB — TYPE AND SCREEN: Antibody Screen: NEGATIVE

## 2010-07-17 LAB — URINALYSIS, ROUTINE W REFLEX MICROSCOPIC
Glucose, UA: NEGATIVE mg/dL
Protein, ur: NEGATIVE mg/dL
Specific Gravity, Urine: 1.02 (ref 1.005–1.030)
pH: 6 (ref 5.0–8.0)

## 2010-07-17 LAB — DIFFERENTIAL
Eosinophils Absolute: 0.2 10*3/uL (ref 0.0–0.7)
Lymphs Abs: 4.1 10*3/uL — ABNORMAL HIGH (ref 0.7–4.0)
Monocytes Relative: 8 % (ref 3–12)
Neutro Abs: 5 10*3/uL (ref 1.7–7.7)
Neutrophils Relative %: 49 % (ref 43–77)

## 2010-07-17 LAB — PROTIME-INR
INR: 1.41 (ref 0.00–1.49)
INR: 1.92 — ABNORMAL HIGH (ref 0.00–1.49)
Prothrombin Time: 12.7 seconds (ref 11.6–15.2)
Prothrombin Time: 13.9 seconds (ref 11.6–15.2)
Prothrombin Time: 17.5 seconds — ABNORMAL HIGH (ref 11.6–15.2)

## 2010-07-17 LAB — ABO/RH: ABO/RH(D): A POS

## 2010-07-17 LAB — SURGICAL PCR SCREEN: Staphylococcus aureus: POSITIVE — AB

## 2010-07-29 ENCOUNTER — Other Ambulatory Visit: Payer: Self-pay | Admitting: Family Medicine

## 2010-07-29 MED ORDER — HYDROCODONE-ACETAMINOPHEN 5-500 MG PO TABS: 1.0000 | ORAL_TABLET | Freq: Three times a day (TID) | ORAL | Status: DC | PRN

## 2010-08-03 ENCOUNTER — Other Ambulatory Visit: Payer: Self-pay | Admitting: Family Medicine

## 2010-08-21 ENCOUNTER — Ambulatory Visit (INDEPENDENT_AMBULATORY_CARE_PROVIDER_SITE_OTHER): Payer: Medicare Other | Admitting: Family Medicine

## 2010-08-21 ENCOUNTER — Other Ambulatory Visit: Payer: PRIVATE HEALTH INSURANCE

## 2010-08-21 DIAGNOSIS — M1710 Unilateral primary osteoarthritis, unspecified knee: Secondary | ICD-10-CM

## 2010-08-21 DIAGNOSIS — E785 Hyperlipidemia, unspecified: Secondary | ICD-10-CM

## 2010-08-21 LAB — COMPREHENSIVE METABOLIC PANEL
Alkaline Phosphatase: 62 U/L (ref 39–117)
Glucose, Bld: 77 mg/dL (ref 70–99)
Sodium: 141 mEq/L (ref 135–145)
Total Bilirubin: 0.2 mg/dL — ABNORMAL LOW (ref 0.3–1.2)
Total Protein: 7 g/dL (ref 6.0–8.3)

## 2010-08-21 LAB — LIPID PANEL
LDL Cholesterol: 67 mg/dL (ref 0–99)
Total CHOL/HDL Ratio: 3.8 Ratio
Triglycerides: 296 mg/dL — ABNORMAL HIGH (ref <150)
VLDL: 59 mg/dL — ABNORMAL HIGH (ref 0–40)

## 2010-08-21 NOTE — Progress Notes (Signed)
  Subjective:    Patient ID: Shelley Black, female    DOB: 1930-10-04, 77 y.o.   MRN: [id number]  HPI  Right knee pain. She has decided that sometime later this summer she will probably see the orthopedist about getting this knee replaced. The left knee replacement has done extremely well. She plans to go to [place] in a few weeks and wants to have decreased pain nausea stare. She decided that she would like steroid injection in the knee.  Review of Systems  no fever no weight loss. The knee has not been warm or red  . Objective:   Physical Exam    general so well developed female no acute distress Right knee external changes of osteoarthritis. No erythema or warmth. Mild crepitus. She lacks full extension by about 5 and full flexion also 5 Distally she is neurovascularly intact. INJECTION: Patient was given informed consent, signed copy in the chart. Appropriate time out was taken. Area prepped and draped in usual sterile fashion. 1 cc of kenalog plus  4 cc of lidocaine was injected into the right knee using a(n)  anterior approach. The patient tolerated the procedure well. There were no complications. Post procedure instructions were given.     Assessment & Plan:

## 2010-08-21 NOTE — Assessment & Plan Note (Signed)
Corticosteroid injection into the right knee. She plans to get TKR done later this summer. Followup when necessary.

## 2010-08-21 NOTE — Progress Notes (Signed)
CMP AND FLP DONE TODAY Shelley Black 

## 2010-08-31 ENCOUNTER — Encounter: Payer: Self-pay | Admitting: Family Medicine

## 2010-12-08 ENCOUNTER — Ambulatory Visit (INDEPENDENT_AMBULATORY_CARE_PROVIDER_SITE_OTHER): Payer: Medicare Other | Admitting: Family Medicine

## 2010-12-08 VITALS — BP 130/61 | HR 82 | Temp 98.5°F

## 2010-12-08 DIAGNOSIS — J020 Streptococcal pharyngitis: Secondary | ICD-10-CM

## 2010-12-08 DIAGNOSIS — J029 Acute pharyngitis, unspecified: Secondary | ICD-10-CM

## 2010-12-08 MED ORDER — PENICILLIN G BENZATHINE 1200000 UNIT/2ML IM SUSP
1.2000 10*6.[IU] | Freq: Once | INTRAMUSCULAR | Status: AC
  Administered 2010-12-08: 1.2 10*6.[IU] via INTRAMUSCULAR

## 2010-12-08 NOTE — Patient Instructions (Signed)
Strep Throat, Adult Strep throat is an infection of the throat caused by a germ (bacteria). A bacteria is a type of tiny living thing that may cause disease. It is most common in late winter and early spring but can happen any time of the year. For patients who have not had their tonsils removed before, this germ can cause Strep tonsillitis. If someone has had the tonsils removed, they can still get Strep throat. Strep throat is contagious and can spread through coughing or sneezing and other close contact with someone who has this problem. SYMPTOMS Common symptoms may include:  Fever.   Painful, red tonsils and/or throat.   White or yellow spots on tonsils and/or throat.   Swollen, tender lymph nodes or "glands" of the neck and/or under the jaw.   Red rash all over the body (uncommon).  DIAGNOSIS In most cases, a "rapid strep test" can help your caregiver make the diagnosis in a few minutes. If this test is not available, a light swab of infected area can be used for a throat culture to see if Strep bacteria are present. The results of a throat culture take about 2 days. TREATMENT Strep throat is generally treated with antibiotic medicine. HOME CARE INSTRUCTIONS  Gargle with 1 teaspoon of salt in 1 cup of warm water, 3 to 4 times per day or as necessary for comfort.   Family members with a sore throat or fever should have a medical examination or throat culture. If there has been a positive throat culture in the family, your caregiver may treat the rest of the family without seeing them. This depends upon his knowledge of their condition and his familiarity with you and your family.   You may return to work when you feel able.   Only take over-the-counter or prescription medicines for pain, discomfort or fever as directed by your caregiver.  SEEK MEDICAL CARE IF:  You have an oral temperature above 100.5.   You develop large glands in your neck.   You develop a rash, cough or  earache.   You cough up green, yellow-brown or bloody sputum.   You have pain or discomfort not controlled by medications or if problems seem to be getting worse rather than better.  SEEK IMMEDIATE MEDICAL CARE IF:  You develop any new symptoms such as vomiting, severe headache, stiff or painful neck, chest pain, shortness of breath, trouble breathing or swallowing.   You develop severe throat pain, drooling or changes in voice.   You develop swelling of the neck, or the skin on the neck becomes red and tender.   You have an oral temperature above 100.5, not controlled by medicine.  Document Released: 04/16/2000 Document Re-Released: 07/14/2009 Exit[employer] Patient Information 2011 [employer]. 

## 2010-12-08 NOTE — Progress Notes (Signed)
Addended by: Jennette Bill on: 12/08/2010 10:29 AM   Modules accepted: Orders

## 2010-12-08 NOTE — Progress Notes (Signed)
  Subjective:    Patient ID: Shelley Black, female    DOB: [birth year], 77 y.o.   MRN: [id number]  HPI Sore throat: + strep throat exposure in grandson who was diagnosed with this last week. Had tmax 101.8 over the week.  + pharyngitis, cervical LAD. No cough, rhinorrhea, increased WOB.    Review of Systems See HPI     Objective:   Physical Exam Gen: in bed, NAD HEENT: + L tonsillar exudate, + cervical LAD.        Assessment & Plan:

## 2010-12-08 NOTE — Assessment & Plan Note (Signed)
Centor criteria >3. Rapid strep positive. Will treat with bicillin 1.2 million units. Discussed red flags for return.

## 2011-02-04 ENCOUNTER — Telehealth: Payer: Self-pay | Admitting: *Deleted

## 2011-02-04 NOTE — Telephone Encounter (Signed)
Husband in office checking on refill for pain medication. RX for wife was sent to [facility] on [address] in [place]. Should have been sent to [address] [facility].  they have picked up rx sent in 10/02 but want to make sure next time is sent to [facility] in [place].

## 2011-02-09 ENCOUNTER — Other Ambulatory Visit: Payer: Self-pay | Admitting: Family Medicine

## 2011-02-15 ENCOUNTER — Other Ambulatory Visit: Payer: Self-pay | Admitting: Family Medicine

## 2011-02-15 DIAGNOSIS — M1990 Unspecified osteoarthritis, unspecified site: Secondary | ICD-10-CM

## 2011-02-23 ENCOUNTER — Encounter: Payer: Self-pay | Admitting: *Deleted

## 2011-02-23 NOTE — Telephone Encounter (Signed)
This encounter was created in error - please disregard.

## 2011-03-09 ENCOUNTER — Encounter: Payer: Self-pay | Admitting: Family Medicine

## 2011-03-09 ENCOUNTER — Ambulatory Visit (INDEPENDENT_AMBULATORY_CARE_PROVIDER_SITE_OTHER): Payer: Medicare Other | Admitting: Family Medicine

## 2011-03-09 VITALS — BP 138/62 | HR 66 | Temp 97.9°F | Wt 153.4 lb

## 2011-03-09 DIAGNOSIS — M1710 Unilateral primary osteoarthritis, unspecified knee: Secondary | ICD-10-CM

## 2011-03-11 NOTE — Progress Notes (Signed)
  Subjective:    Patient ID: Shelley Black, female    DOB: 01/05/1931, 77 y.o.   MRN: [id number]  HPI  Here  With her husband and wants to know if she can get L knee injection [place] and she will be doing more walking. Her right knee is great s/p tkr. Her left knee is improved for about 4-6 weeks after a shot but improvement is 50% or so. She is thinkkng about going ahead and getting TKR on teh left after the holidays, PERTINENT  PMH / PSH: Roght TKR last year with good result Review of Systems Pertinent review of systems: negative for fever or unusual weight change. No redness or swelling of knee (left)    Objective:   Physical Exam   Vital signs reviewed. GENERAL: Well developed, well nourished, no acute distress Left knee no effusion but obvious external changes c/w synovial swelling and IA deformity. Lacks full extension by 10 degrees. INJECTION: Patient was given informed consent, signed copy in the chart. Appropriate time out was taken. Area prepped and draped in usual sterile fashion. 1 cc of methylprednisolone 40 mg/ml plus4 cc of 1% lidocaine without epinephrine was injected into the left knee  using a(n) anterior approach. The patient tolerated the procedure well. There were no complications. Post procedure instructions were given.      Assessment & Plan:  OA left knee CSI today She will f/u prn We discussed probable TKR in a few months.

## 2011-04-22 ENCOUNTER — Other Ambulatory Visit: Payer: Self-pay | Admitting: Family Medicine

## 2011-04-22 MED ORDER — PRAVASTATIN SODIUM 40 MG PO TABS: 40.0000 mg | ORAL_TABLET | Freq: Every day | ORAL | Status: DC

## 2011-05-18 DIAGNOSIS — Z961 Presence of intraocular lens: Secondary | ICD-10-CM | POA: Diagnosis not present

## 2011-05-18 DIAGNOSIS — H5200 Hypermetropia, unspecified eye: Secondary | ICD-10-CM | POA: Diagnosis not present

## 2011-05-18 DIAGNOSIS — H35379 Puckering of macula, unspecified eye: Secondary | ICD-10-CM | POA: Diagnosis not present

## 2011-05-18 DIAGNOSIS — Z9849 Cataract extraction status, unspecified eye: Secondary | ICD-10-CM | POA: Diagnosis not present

## 2011-06-01 DIAGNOSIS — H5200 Hypermetropia, unspecified eye: Secondary | ICD-10-CM | POA: Diagnosis not present

## 2011-06-01 DIAGNOSIS — H353190 Nonexudative age-related macular degeneration, unspecified eye, stage unspecified: Secondary | ICD-10-CM | POA: Diagnosis not present

## 2011-06-01 DIAGNOSIS — H35379 Puckering of macula, unspecified eye: Secondary | ICD-10-CM | POA: Diagnosis not present

## 2011-06-01 DIAGNOSIS — Z961 Presence of intraocular lens: Secondary | ICD-10-CM | POA: Diagnosis not present

## 2011-06-16 DIAGNOSIS — H35379 Puckering of macula, unspecified eye: Secondary | ICD-10-CM | POA: Diagnosis not present

## 2011-06-16 DIAGNOSIS — H43819 Vitreous degeneration, unspecified eye: Secondary | ICD-10-CM | POA: Diagnosis not present

## 2011-06-16 DIAGNOSIS — H3581 Retinal edema: Secondary | ICD-10-CM | POA: Diagnosis not present

## 2011-06-16 DIAGNOSIS — H4310 Vitreous hemorrhage, unspecified eye: Secondary | ICD-10-CM | POA: Diagnosis not present

## 2011-06-22 ENCOUNTER — Other Ambulatory Visit: Payer: Self-pay | Admitting: Family Medicine

## 2011-06-22 DIAGNOSIS — Z1231 Encounter for screening mammogram for malignant neoplasm of breast: Secondary | ICD-10-CM

## 2011-07-14 ENCOUNTER — Ambulatory Visit (INDEPENDENT_AMBULATORY_CARE_PROVIDER_SITE_OTHER): Payer: Medicare Other | Admitting: Family Medicine

## 2011-07-14 ENCOUNTER — Encounter: Payer: Self-pay | Admitting: Family Medicine

## 2011-07-14 VITALS — BP 128/78 | HR 72 | Temp 97.7°F | Ht 66.0 in | Wt 152.0 lb

## 2011-07-14 DIAGNOSIS — M1990 Unspecified osteoarthritis, unspecified site: Secondary | ICD-10-CM | POA: Diagnosis not present

## 2011-07-14 DIAGNOSIS — R5381 Other malaise: Secondary | ICD-10-CM

## 2011-07-14 DIAGNOSIS — K140 Glossitis: Secondary | ICD-10-CM | POA: Diagnosis not present

## 2011-07-14 DIAGNOSIS — R5383 Other fatigue: Secondary | ICD-10-CM

## 2011-07-14 LAB — CBC WITH DIFFERENTIAL/PLATELET
Basophils Absolute: 0 10*3/uL (ref 0.0–0.1)
Basophils Relative: 1 % (ref 0–1)
Eosinophils Absolute: 0.3 10*3/uL (ref 0.0–0.7)
Eosinophils Relative: 3 % (ref 0–5)
HCT: 39.4 % (ref 36.0–46.0)
MCH: 31.1 pg (ref 26.0–34.0)
MCHC: 32.5 g/dL (ref 30.0–36.0)
MCV: 95.6 fL (ref 78.0–100.0)
Monocytes Absolute: 0.8 10*3/uL (ref 0.1–1.0)
Neutro Abs: 4.5 10*3/uL (ref 1.7–7.7)
RDW: 12.9 % (ref 11.5–15.5)

## 2011-07-14 LAB — COMPLETE METABOLIC PANEL WITH GFR
AST: 19 U/L (ref 0–37)
Alkaline Phosphatase: 51 U/L (ref 39–117)
BUN: 30 mg/dL — ABNORMAL HIGH (ref 6–23)
Creat: 0.89 mg/dL (ref 0.50–1.10)
GFR, Est Non African American: 62 mL/min
Glucose, Bld: 83 mg/dL (ref 70–99)

## 2011-07-14 LAB — TSH: TSH: 2.298 u[IU]/mL (ref 0.350–4.500)

## 2011-07-14 LAB — VITAMIN B12: Vitamin B-12: 870 pg/mL (ref 211–911)

## 2011-07-14 MED ORDER — HYDROCODONE-ACETAMINOPHEN 5-500 MG PO TABS
ORAL_TABLET | ORAL | Status: DC
Start: 2011-07-14 — End: 2011-12-09

## 2011-07-16 NOTE — Progress Notes (Signed)
  Subjective:    Patient ID: Shelley Black, female    DOB: 01/31/1931, 78 y.o.   MRN: [id number]  HPI Right knee pain. Corticosteroid injection. She is trying to put the total knee replacement off until after a family wedding in July. Right now the pain in the right knee is 10 out of 10 by mid day almost every day. Is also keeping her from sleeping. #2. Decreased appetite but no change in weight. Energy levels also been down a little bit. Feels like she sleeps well most nights but admits some awakening secondary to knee pain. Plantar cell rate to take a nap during the day which is a little unusual for her. This has been going on for the last year but seems a little worse in the last couple of months.   Review of Systems Denies fever, sweats, chills.    Objective:   Physical Exam  Vital signs reviewed. GENERAL: Well developed, well nourished, no acute distress KNEE: Right. External changes synovitis consistent with severe osteoarthritis. Lacks full extension 5. Calf is soft. Medial joint line tenderness. No effusion. NECK: No thyromegaly, no thyroid bruits, no nodules. Cardiovascular: Regular rate and rhythm without murmur gallop or rub LUNGS: Clear to auscultation bilaterally ABDOMEN: Soft positive bowel sounds. HEENT: op clear, tongue a little dry and pale. No LAD in neck. PERRLA nonicteric conjucntiva INJECTION: Patient was given informed consent, signed copy in the chart. Appropriate time out was taken. Area prepped and draped in usual sterile fashion. One cc of methylprednisolone 40 mg/ml plus  4 cc of 1% lidocaine without epinephrine was injected into the right knee using a(n) anterior approach. The patient tolerated the procedure well. There were no complications. Post procedure instructions were given.       Assessment & Plan:  #1. Severe osteoarthritis right knee. Corticosteroid injection today. We'll see if we can get her through July which he plans to pursue TKR of that  knee. #2. Decreased appetite without significant weight loss but with some decreased energy level. At that time nothing on physical. We'll check some lab work. Reviewing her history the only other thing I think we could potentially do is repeat pelvic ultrasound because at one point she did have an ovarian mass.

## 2011-07-20 ENCOUNTER — Encounter: Payer: Self-pay | Admitting: Family Medicine

## 2011-07-20 ENCOUNTER — Telehealth: Payer: Self-pay | Admitting: Family Medicine

## 2011-07-20 NOTE — Telephone Encounter (Signed)
Left message on her mobile that her lab work was essentially normal. One to see how she was feeling. If she still having the lack of energy I think we probably should go ahead and do a bit more workup. The only thing in her history that worries me assist history of an ovarian mass. We should probably repeat a CT scan of her pelvis. Also want to talk to her about her husband is in the hospital right now. I spoke with him today and told him I would update her by phone. I'll try to call her back later

## 2011-07-21 ENCOUNTER — Ambulatory Visit: Payer: Medicare Other

## 2011-08-03 ENCOUNTER — Other Ambulatory Visit: Payer: Self-pay | Admitting: Family Medicine

## 2011-08-03 MED ORDER — RAMIPRIL 10 MG PO CAPS: ORAL_CAPSULE | ORAL | Status: DC

## 2011-08-27 ENCOUNTER — Other Ambulatory Visit: Payer: Medicare Other

## 2011-08-27 DIAGNOSIS — E785 Hyperlipidemia, unspecified: Secondary | ICD-10-CM | POA: Diagnosis not present

## 2011-08-27 DIAGNOSIS — I10 Essential (primary) hypertension: Secondary | ICD-10-CM

## 2011-08-27 NOTE — Progress Notes (Signed)
Drew FLP   BAJORDAN, MLS 

## 2011-08-28 LAB — LIPID PANEL
Cholesterol: 198 mg/dL (ref 0–200)
HDL: 51 mg/dL (ref >39)
Total CHOL/HDL Ratio: 3.9 Ratio

## 2011-08-30 ENCOUNTER — Encounter: Payer: Self-pay | Admitting: Family Medicine

## 2011-09-15 ENCOUNTER — Ambulatory Visit (INDEPENDENT_AMBULATORY_CARE_PROVIDER_SITE_OTHER): Payer: Medicare Other | Admitting: Family Medicine

## 2011-09-15 ENCOUNTER — Encounter: Payer: Self-pay | Admitting: Family Medicine

## 2011-09-15 VITALS — BP 142/78 | HR 73 | Temp 97.4°F | Ht 65.0 in | Wt 152.0 lb

## 2011-09-15 DIAGNOSIS — IMO0002 Reserved for concepts with insufficient information to code with codable children: Secondary | ICD-10-CM | POA: Diagnosis not present

## 2011-09-15 DIAGNOSIS — M1710 Unilateral primary osteoarthritis, unspecified knee: Secondary | ICD-10-CM | POA: Diagnosis not present

## 2011-09-15 NOTE — Progress Notes (Signed)
  Subjective:    Patient ID: Shelley Black, female    DOB: August 06, 1930, 78 y.o.   MRN: [id number]  HPI  Right knee pain. She has plans to get this knee replaced after a family wedding in July. She was hoping to put off more injections until after that time but the pain is just terrible. She had an injection a month or 2 ago progressed to that her husband got sick and she was doing a lot more going up and down stairs. She blames this increased pain on that. Pain is constant 7-8/10. No knee giving way but it is quite stiff.  Review of Systems No unusual weight change. No fevers, sweats, chills.    Objective:   Physical Exam  GENERAL: Well-developed female no acute distress KNEES: Right small effusion. Lacks full extension by about 8. She has full flexion. External changes consistent with osteoarthritis. Distally she is neurovascularly intact. Calf is soft.  INJECTION: Patient was given informed consent, signed copy in the chart. Appropriate time out was taken. Area prepped and draped in usual sterile fashion. One cc of methylprednisolone 40 mg/ml plus  4 cc of 1% lidocainewithout epinephrine  +2 cc of bupivacaine without epinephrine was injected into the right knee joint using a(n) anterior medial approach. The patient tolerated the procedure well. There were no complications. Post procedure instructions were given.     Assessment & Plan:  Severe osteo-arthritis right knee. She plans still knee replacement after family wedding in July. We'll inject her to a corticosteroid. She may actually have another injection if she wishes prior to her trip to knee work for the family wedding. Otherwise to return for regular followup.

## 2011-10-27 ENCOUNTER — Encounter: Payer: Self-pay | Admitting: Family Medicine

## 2011-10-27 ENCOUNTER — Ambulatory Visit (INDEPENDENT_AMBULATORY_CARE_PROVIDER_SITE_OTHER): Payer: Medicare Other | Admitting: Family Medicine

## 2011-10-27 VITALS — BP 132/62 | HR 72 | Temp 97.8°F | Wt 154.0 lb

## 2011-10-27 DIAGNOSIS — M1710 Unilateral primary osteoarthritis, unspecified knee: Secondary | ICD-10-CM | POA: Diagnosis not present

## 2011-10-27 DIAGNOSIS — IMO0002 Reserved for concepts with insufficient information to code with codable children: Secondary | ICD-10-CM | POA: Diagnosis not present

## 2011-10-29 NOTE — Progress Notes (Signed)
  Subjective:    Patient ID: Shelley Black, female    DOB: 1930/05/31, 78 y.o.   MRN: [id number]  HPI  Right knee pain. She is desperately trying to get through July and her family vacation to [place] to see family for she gets her right knee replaced. The last injection helped about 2 weeks. She like another one today. Knee is painful all the time 7-8/10. It is not giving out but feels very stiff. Pain is occasionally keeping her awake at night  Review of Systems Denies fever, sweats, chills.    Objective:   Physical Exam   GENERAL: Well-developed female no acute distress. Vital signs are reviewed. KNEES: Right knee has external changes of osteoarthritis consistent with synovitis. Lacks full extension by about 5. Has full flexion. Tenderness along the medial and lateral joint lines. /\INJECTION: Patient was given informed consent, signed copy in the chart. Appropriate time out was taken. Area prepped and draped in usual sterile fashion. One cc of methylprednisolone 40 mg/ml plus  3 cc of 1% lidocaine without epinephrine was injected into the right knee using a(n) anterior medial approach. The patient tolerated the procedure well. There were no complications. Post procedure instructions were given.      Assessment & Plan:  Chronic severe Oster arthritis right knee. We'll inject her today. I agree with her total knee replacement plans.

## 2011-11-08 ENCOUNTER — Ambulatory Visit (INDEPENDENT_AMBULATORY_CARE_PROVIDER_SITE_OTHER): Payer: Medicare Other | Admitting: Family Medicine

## 2011-11-08 ENCOUNTER — Other Ambulatory Visit: Payer: Self-pay | Admitting: *Deleted

## 2011-11-08 VITALS — BP 146/64

## 2011-11-08 DIAGNOSIS — IMO0002 Reserved for concepts with insufficient information to code with codable children: Secondary | ICD-10-CM | POA: Diagnosis not present

## 2011-11-08 DIAGNOSIS — M1710 Unilateral primary osteoarthritis, unspecified knee: Secondary | ICD-10-CM | POA: Diagnosis not present

## 2011-11-08 MED ORDER — QUETIAPINE FUMARATE 25 MG PO TABS: 25.0000 mg | ORAL_TABLET | Freq: Every day | ORAL | Status: DC

## 2011-11-09 NOTE — Progress Notes (Signed)
  Subjective:    Patient ID: Shelley Black, female    DOB: 1931-01-28, 78 y.o.   MRN: [id number]  HPI  Right knee pain. She still trying to limp through her upcoming family wedding. We gave her injection 2 weeks ago and it helped for about a week. She leaves for the wedding Saturday wants to try one final shot. No new swelling or redness or warmth of the joint. Pain most of the time is 6-8/10. The first couple days after the shot it decreased to 4-5/10.  Review of Systems Denies fever, sweats, chills.    Objective:   Physical Exam Vital signs reviewed. GENERAL: Well developed, well nourished, no acute distress KNEE: Right significant synovitis. Tender to palpation medial greater than lateral joint line. Lacks full extension by 10. Full flexion maintained. Distally neurovascularly intact. INJECTION: Patient was given informed consent, signed copy in the chart. Appropriate time out was taken. Area prepped and draped in usual sterile fashion. One cc of methylprednisolone 40 mg/ml plus  4 cc of 1% lidocaine without epinephrine was injected into the right knee using a(n) anterior medial approach. The patient tolerated the procedure well. There were no complications. Post procedure instructions were given.         Assessment & Plan:  Severe DJD right knee. Last corticosteroid injection. She plans to followup with orthopedics for TKR upon her return from family trip.

## 2011-11-25 DIAGNOSIS — R29898 Other symptoms and signs involving the musculoskeletal system: Secondary | ICD-10-CM | POA: Diagnosis not present

## 2011-12-02 ENCOUNTER — Other Ambulatory Visit: Payer: Self-pay | Admitting: Orthopedic Surgery

## 2011-12-09 ENCOUNTER — Encounter (HOSPITAL_COMMUNITY): Payer: Self-pay | Admitting: Pharmacy Technician

## 2011-12-16 DIAGNOSIS — M545 Low back pain: Secondary | ICD-10-CM | POA: Diagnosis not present

## 2011-12-16 DIAGNOSIS — M1710 Unilateral primary osteoarthritis, unspecified knee: Secondary | ICD-10-CM | POA: Diagnosis not present

## 2011-12-20 ENCOUNTER — Encounter (HOSPITAL_COMMUNITY): Payer: Self-pay

## 2011-12-20 ENCOUNTER — Encounter (HOSPITAL_COMMUNITY)
Admission: RE | Admit: 2011-12-20 | Discharge: 2011-12-20 | Disposition: A | Discharge status: Home / Self Care | Payer: Medicare Other | Source: Ambulatory Visit | Attending: Orthopedic Surgery | Admitting: Orthopedic Surgery

## 2011-12-20 DIAGNOSIS — Z01811 Encounter for preprocedural respiratory examination: Secondary | ICD-10-CM | POA: Diagnosis not present

## 2011-12-20 DIAGNOSIS — M1710 Unilateral primary osteoarthritis, unspecified knee: Secondary | ICD-10-CM | POA: Diagnosis not present

## 2011-12-20 DIAGNOSIS — R918 Other nonspecific abnormal finding of lung field: Secondary | ICD-10-CM | POA: Diagnosis not present

## 2011-12-20 DIAGNOSIS — J438 Other emphysema: Secondary | ICD-10-CM | POA: Diagnosis not present

## 2011-12-20 DIAGNOSIS — I10 Essential (primary) hypertension: Secondary | ICD-10-CM | POA: Diagnosis not present

## 2011-12-20 DIAGNOSIS — D62 Acute posthemorrhagic anemia: Secondary | ICD-10-CM | POA: Diagnosis not present

## 2011-12-20 DIAGNOSIS — E871 Hypo-osmolality and hyponatremia: Secondary | ICD-10-CM | POA: Diagnosis not present

## 2011-12-20 HISTORY — DX: Unspecified osteoarthritis, unspecified site: M19.90

## 2011-12-20 HISTORY — DX: Essential (primary) hypertension: I10

## 2011-12-20 HISTORY — DX: Cardiac murmur, unspecified: R01.1

## 2011-12-20 LAB — COMPREHENSIVE METABOLIC PANEL
ALT: 17 U/L (ref 0–35)
AST: 18 U/L (ref 0–37)
Albumin: 3.5 g/dL (ref 3.5–5.2)
Alkaline Phosphatase: 50 U/L (ref 39–117)
BUN: 28 mg/dL — ABNORMAL HIGH (ref 6–23)
Chloride: 98 mEq/L (ref 96–112)
Potassium: 3.9 mEq/L (ref 3.5–5.1)
Sodium: 137 mEq/L (ref 135–145)
Total Bilirubin: 0.2 mg/dL — ABNORMAL LOW (ref 0.3–1.2)

## 2011-12-20 LAB — URINALYSIS, ROUTINE W REFLEX MICROSCOPIC
Nitrite: NEGATIVE
Specific Gravity, Urine: 1.02 (ref 1.005–1.030)
Urobilinogen, UA: 0.2 mg/dL (ref 0.0–1.0)
pH: 7 (ref 5.0–8.0)

## 2011-12-20 LAB — TYPE AND SCREEN

## 2011-12-20 LAB — SURGICAL PCR SCREEN: Staphylococcus aureus: NEGATIVE

## 2011-12-20 LAB — CBC WITH DIFFERENTIAL/PLATELET
Basophils Relative: 0 % (ref 0–1)
Hemoglobin: 12.3 g/dL (ref 12.0–15.0)
MCHC: 33.4 g/dL (ref 30.0–36.0)
Monocytes Relative: 10 % (ref 3–12)
Neutro Abs: 6 10*3/uL (ref 1.7–7.7)
Neutrophils Relative %: 56 % (ref 43–77)
Platelets: 403 10*3/uL — ABNORMAL HIGH (ref 150–400)
RBC: 3.92 MIL/uL (ref 3.87–5.11)

## 2011-12-20 LAB — PROTIME-INR: Prothrombin Time: 12.6 seconds (ref 11.6–15.2)

## 2011-12-20 LAB — URINE MICROSCOPIC-ADD ON

## 2011-12-20 LAB — APTT: aPTT: 30 seconds (ref 24–37)

## 2011-12-20 NOTE — Progress Notes (Signed)
Pt has Hx of HTN, denies any chest pain or any other cardiac history.  Pt does not see a cardiologist and has not had a 2D echo.  Dr Noreene Larsson notified, no new oreders.

## 2011-12-20 NOTE — Pre-Procedure Instructions (Addendum)
20 Shelley Black  12/20/2011   Your procedure is scheduled on: Friday, August 24th.  Report to [facility] at 5:30 AM.  Call this number if you have problems the morning of surgery: [phone]   Remember:   Do not eat food or anything to drink:After Midnight.  May have clear liquids:until Midnight .  Clear liquids include soda, tea, black coffee, apple or grape juice, broth.  Take these medicines the morning of surgery with A SIP OF WATER: Vicodin if needed.  Stop taking Aspirin, Fish Oil and any Herbal medication.   Do not wear jewelry, make-up or nail polish.  Do not wear lotions, powders, or perfumes. You may wear deodorant.  Do not shave 48 hours prior to surgery. Men may shave face and neck.  Do not bring valuables to the hospital.  Contacts, dentures or bridgework may not be worn into surgery.  Leave suitcase in the car. After surgery it may be brought to your room.  For patients admitted to the hospital, checkout time is 11:00 AM the day of discharge.   Patients discharged the day of surgery will not be allowed to drive home.  Name and phone number of your driver: NA  Special Instructions: CHG Shower Use Special Wash: 1/2 bottle night before surgery and 1/2 bottle morning of surgery.   Please read over the following fact sheets that you were given: Pain Booklet, Coughing and Deep Breathing, Blood Transfusion Information and MRSA Information

## 2011-12-23 MED ORDER — CEFAZOLIN SODIUM-DEXTROSE 2-3 GM-% IV SOLR
2.0000 g | INTRAVENOUS | Status: AC
  Administered 2011-12-24: 2 g via INTRAVENOUS
  Filled 2011-12-23: qty 50

## 2011-12-24 ENCOUNTER — Ambulatory Visit (HOSPITAL_COMMUNITY): Payer: Medicare Other | Admitting: Anesthesiology

## 2011-12-24 ENCOUNTER — Encounter (HOSPITAL_COMMUNITY): Payer: Self-pay | Admitting: Anesthesiology

## 2011-12-24 ENCOUNTER — Encounter (HOSPITAL_COMMUNITY)
Admission: RE | Disposition: A | Discharge status: Home Health | Payer: Self-pay | Source: Ambulatory Visit | Attending: Orthopedic Surgery

## 2011-12-24 ENCOUNTER — Inpatient Hospital Stay (HOSPITAL_COMMUNITY)
Admission: RE | Admit: 2011-12-24 | Discharge: 2011-12-28 | DRG: 470 | Disposition: A | Discharge status: Home Health | Payer: Medicare Other | Source: Ambulatory Visit | Attending: Orthopedic Surgery | Admitting: Orthopedic Surgery

## 2011-12-24 DIAGNOSIS — G8918 Other acute postprocedural pain: Secondary | ICD-10-CM | POA: Diagnosis not present

## 2011-12-24 DIAGNOSIS — Z01812 Encounter for preprocedural laboratory examination: Secondary | ICD-10-CM

## 2011-12-24 DIAGNOSIS — E876 Hypokalemia: Secondary | ICD-10-CM | POA: Diagnosis not present

## 2011-12-24 DIAGNOSIS — Z96659 Presence of unspecified artificial knee joint: Secondary | ICD-10-CM | POA: Diagnosis not present

## 2011-12-24 DIAGNOSIS — I10 Essential (primary) hypertension: Secondary | ICD-10-CM | POA: Diagnosis not present

## 2011-12-24 DIAGNOSIS — M25569 Pain in unspecified knee: Secondary | ICD-10-CM | POA: Diagnosis not present

## 2011-12-24 DIAGNOSIS — E871 Hypo-osmolality and hyponatremia: Secondary | ICD-10-CM | POA: Diagnosis not present

## 2011-12-24 DIAGNOSIS — M1710 Unilateral primary osteoarthritis, unspecified knee: Secondary | ICD-10-CM | POA: Diagnosis not present

## 2011-12-24 DIAGNOSIS — M1711 Unilateral primary osteoarthritis, right knee: Secondary | ICD-10-CM

## 2011-12-24 DIAGNOSIS — D62 Acute posthemorrhagic anemia: Secondary | ICD-10-CM | POA: Diagnosis not present

## 2011-12-24 HISTORY — PX: TOTAL KNEE ARTHROPLASTY: SHX125

## 2011-12-24 SURGERY — ARTHROPLASTY, KNEE, TOTAL
Anesthesia: General | Site: Knee | Laterality: Right | Wound class: Clean

## 2011-12-24 MED ORDER — VITAMIN D3 25 MCG (1000 UNIT) PO TABS
2000.0000 [IU] | ORAL_TABLET | Freq: Every day | ORAL | Status: DC
  Administered 2011-12-24 – 2011-12-28 (×5): 2000 [IU] via ORAL
  Filled 2011-12-24 (×5): qty 2

## 2011-12-24 MED ORDER — HYDROMORPHONE HCL PF 1 MG/ML IJ SOLN
0.2500 mg | INTRAMUSCULAR | Status: DC | PRN
  Administered 2011-12-24 (×4): 0.5 mg via INTRAVENOUS

## 2011-12-24 MED ORDER — QUETIAPINE FUMARATE 25 MG PO TABS
25.0000 mg | ORAL_TABLET | Freq: Every day | ORAL | Status: DC
  Administered 2011-12-24 – 2011-12-27 (×4): 25 mg via ORAL
  Filled 2011-12-24 (×5): qty 1

## 2011-12-24 MED ORDER — ACETAMINOPHEN 10 MG/ML IV SOLN
1000.0000 mg | Freq: Four times a day (QID) | INTRAVENOUS | Status: AC
  Administered 2011-12-24 – 2011-12-25 (×4): 1000 mg via INTRAVENOUS
  Filled 2011-12-24 (×4): qty 100

## 2011-12-24 MED ORDER — HYDROMORPHONE HCL PF 1 MG/ML IJ SOLN
1.0000 mg | INTRAMUSCULAR | Status: DC | PRN
  Administered 2011-12-24 – 2011-12-28 (×15): 1 mg via INTRAVENOUS
  Filled 2011-12-24 (×16): qty 1

## 2011-12-24 MED ORDER — EPHEDRINE SULFATE 50 MG/ML IJ SOLN
INTRAMUSCULAR | Status: DC | PRN
  Administered 2011-12-24: 10 mg via INTRAVENOUS

## 2011-12-24 MED ORDER — POVIDONE-IODINE 7.5 % EX SOLN: Freq: Once | CUTANEOUS | Status: DC

## 2011-12-24 MED ORDER — PROPOFOL 10 MG/ML IV EMUL
INTRAVENOUS | Status: DC | PRN
  Administered 2011-12-24: 200 mg via INTRAVENOUS

## 2011-12-24 MED ORDER — METHOCARBAMOL 100 MG/ML IJ SOLN
500.0000 mg | Freq: Four times a day (QID) | INTRAMUSCULAR | Status: DC | PRN
  Administered 2011-12-24: 500 mg via INTRAVENOUS
  Filled 2011-12-24: qty 5

## 2011-12-24 MED ORDER — HYDROMORPHONE HCL PF 1 MG/ML IJ SOLN
INTRAMUSCULAR | Status: AC
  Filled 2011-12-24: qty 2

## 2011-12-24 MED ORDER — WARFARIN SODIUM 2.5 MG PO TABS
2.5000 mg | ORAL_TABLET | Freq: Once | ORAL | Status: AC
  Administered 2011-12-24: 2.5 mg via ORAL
  Filled 2011-12-24: qty 1

## 2011-12-24 MED ORDER — COUMADIN BOOK
Freq: Once | Status: DC
  Filled 2011-12-24: qty 1

## 2011-12-24 MED ORDER — FENTANYL CITRATE 0.05 MG/ML IJ SOLN
INTRAMUSCULAR | Status: DC | PRN
  Administered 2011-12-24 (×3): 50 ug via INTRAVENOUS
  Administered 2011-12-24: 100 ug via INTRAVENOUS
  Administered 2011-12-24 (×3): 50 ug via INTRAVENOUS

## 2011-12-24 MED ORDER — POLYETHYLENE GLYCOL 3350 17 G PO PACK: 17.0000 g | PACK | Freq: Every day | ORAL | Status: DC | PRN

## 2011-12-24 MED ORDER — KETOROLAC TROMETHAMINE 15 MG/ML IJ SOLN
7.5000 mg | Freq: Four times a day (QID) | INTRAMUSCULAR | Status: AC
  Administered 2011-12-24 – 2011-12-25 (×4): 7.5 mg via INTRAVENOUS
  Filled 2011-12-24 (×4): qty 1

## 2011-12-24 MED ORDER — WARFARIN VIDEO: Freq: Once | Status: DC

## 2011-12-24 MED ORDER — ONDANSETRON HCL 4 MG/2ML IJ SOLN
INTRAMUSCULAR | Status: DC | PRN
  Administered 2011-12-24: 4 mg via INTRAVENOUS

## 2011-12-24 MED ORDER — DEXTROSE-NACL 5-0.45 % IV SOLN
INTRAVENOUS | Status: DC
  Administered 2011-12-24 – 2011-12-25 (×3): via INTRAVENOUS

## 2011-12-24 MED ORDER — CEFUROXIME SODIUM 1.5 G IJ SOLR
INTRAMUSCULAR | Status: AC
  Filled 2011-12-24: qty 1.5

## 2011-12-24 MED ORDER — WARFARIN - PHARMACIST DOSING INPATIENT: Freq: Every day | Status: DC

## 2011-12-24 MED ORDER — MEPERIDINE HCL 25 MG/ML IJ SOLN: 6.2500 mg | INTRAMUSCULAR | Status: DC | PRN

## 2011-12-24 MED ORDER — SIMVASTATIN 20 MG PO TABS
20.0000 mg | ORAL_TABLET | Freq: Every day | ORAL | Status: DC
  Administered 2011-12-24 – 2011-12-27 (×4): 20 mg via ORAL
  Filled 2011-12-24 (×5): qty 1

## 2011-12-24 MED ORDER — OXYCODONE HCL 5 MG PO TABS
5.0000 mg | ORAL_TABLET | ORAL | Status: DC | PRN
  Administered 2011-12-24 – 2011-12-28 (×16): 10 mg via ORAL
  Filled 2011-12-24 (×16): qty 2

## 2011-12-24 MED ORDER — ONDANSETRON HCL 4 MG PO TABS
4.0000 mg | ORAL_TABLET | Freq: Four times a day (QID) | ORAL | Status: DC | PRN
  Filled 2011-12-24: qty 1

## 2011-12-24 MED ORDER — ACETAMINOPHEN 10 MG/ML IV SOLN
INTRAVENOUS | Status: DC | PRN
  Administered 2011-12-24: 1000 mg via INTRAVENOUS

## 2011-12-24 MED ORDER — HYDROCHLOROTHIAZIDE 25 MG PO TABS
25.0000 mg | ORAL_TABLET | Freq: Every day | ORAL | Status: DC
  Administered 2011-12-24 – 2011-12-28 (×5): 25 mg via ORAL
  Filled 2011-12-24 (×5): qty 1

## 2011-12-24 MED ORDER — MIDAZOLAM HCL 5 MG/5ML IJ SOLN
INTRAMUSCULAR | Status: DC | PRN
  Administered 2011-12-24: 1 mg via INTRAVENOUS

## 2011-12-24 MED ORDER — PROMETHAZINE HCL 25 MG/ML IJ SOLN: 6.2500 mg | INTRAMUSCULAR | Status: DC | PRN

## 2011-12-24 MED ORDER — CEFAZOLIN SODIUM-DEXTROSE 2-3 GM-% IV SOLR
2.0000 g | Freq: Four times a day (QID) | INTRAVENOUS | Status: AC
  Administered 2011-12-24 (×2): 2 g via INTRAVENOUS
  Filled 2011-12-24 (×2): qty 50

## 2011-12-24 MED ORDER — DOCUSATE SODIUM 100 MG PO CAPS
100.0000 mg | ORAL_CAPSULE | Freq: Two times a day (BID) | ORAL | Status: DC
  Administered 2011-12-24 – 2011-12-28 (×9): 100 mg via ORAL
  Filled 2011-12-24 (×10): qty 1

## 2011-12-24 MED ORDER — ACETAMINOPHEN 10 MG/ML IV SOLN
INTRAVENOUS | Status: AC
  Filled 2011-12-24: qty 100

## 2011-12-24 MED ORDER — ALUM & MAG HYDROXIDE-SIMETH 200-200-20 MG/5ML PO SUSP
30.0000 mL | ORAL | Status: DC | PRN
  Administered 2011-12-25: 30 mL via ORAL
  Filled 2011-12-24: qty 30

## 2011-12-24 MED ORDER — FERROUS SULFATE 325 (65 FE) MG PO TABS
325.0000 mg | ORAL_TABLET | Freq: Two times a day (BID) | ORAL | Status: DC
  Administered 2011-12-24 – 2011-12-28 (×8): 325 mg via ORAL
  Filled 2011-12-24 (×10): qty 1

## 2011-12-24 MED ORDER — SODIUM CHLORIDE 0.9 % IR SOLN
Status: DC | PRN
  Administered 2011-12-24: 3000 mL
  Administered 2011-12-24: 1000 mL

## 2011-12-24 MED ORDER — RAMIPRIL 10 MG PO CAPS
10.0000 mg | ORAL_CAPSULE | Freq: Two times a day (BID) | ORAL | Status: DC
  Administered 2011-12-24 – 2011-12-28 (×8): 10 mg via ORAL
  Filled 2011-12-24 (×10): qty 1

## 2011-12-24 MED ORDER — BUPIVACAINE-EPINEPHRINE PF 0.5-1:200000 % IJ SOLN
INTRAMUSCULAR | Status: DC | PRN
  Administered 2011-12-24: 30 mL

## 2011-12-24 MED ORDER — LACTATED RINGERS IV SOLN
INTRAVENOUS | Status: DC | PRN
  Administered 2011-12-24 (×2): via INTRAVENOUS

## 2011-12-24 MED ORDER — CEFUROXIME SODIUM 1.5 G IJ SOLR
INTRAMUSCULAR | Status: DC | PRN
  Administered 2011-12-24: 1.5 g

## 2011-12-24 MED ORDER — METHOCARBAMOL 500 MG PO TABS
500.0000 mg | ORAL_TABLET | Freq: Four times a day (QID) | ORAL | Status: DC | PRN
  Administered 2011-12-24 – 2011-12-28 (×10): 500 mg via ORAL
  Filled 2011-12-24 (×11): qty 1

## 2011-12-24 MED ORDER — LIDOCAINE HCL (CARDIAC) 20 MG/ML IV SOLN
INTRAVENOUS | Status: DC | PRN
  Administered 2011-12-24: 80 mg via INTRAVENOUS

## 2011-12-24 MED ORDER — VITAMIN D3 50 MCG (2000 UT) PO TABS: 1.0000 | ORAL_TABLET | Freq: Every day | ORAL | Status: DC

## 2011-12-24 MED ORDER — ONDANSETRON HCL 4 MG/2ML IJ SOLN
4.0000 mg | Freq: Four times a day (QID) | INTRAMUSCULAR | Status: DC | PRN
  Administered 2011-12-25: 4 mg via INTRAVENOUS
  Filled 2011-12-24 (×2): qty 2

## 2011-12-24 SURGICAL SUPPLY — 67 items
BANDAGE ESMARK 6X9 LF (GAUZE/BANDAGES/DRESSINGS) ×1 IMPLANT
BENZOIN TINCTURE PRP APPL 2/3 (GAUZE/BANDAGES/DRESSINGS) ×2 IMPLANT
BLADE SAGITTAL 25.0X1.19X90 (BLADE) ×2 IMPLANT
BLADE SAW SAG 90X13X1.27 (BLADE) ×2 IMPLANT
BNDG ESMARK 6X9 LF (GAUZE/BANDAGES/DRESSINGS) ×2
BOWL SMART MIX CTS (DISPOSABLE) ×2 IMPLANT
CEMENT HV SMART SET (Cement) ×4 IMPLANT
CLOTH BEACON ORANGE TIMEOUT ST (SAFETY) ×2 IMPLANT
COVER BACK TABLE 24X17X13 BIG (DRAPES) IMPLANT
COVER SURGICAL LIGHT HANDLE (MISCELLANEOUS) ×2 IMPLANT
CUFF TOURNIQUET SINGLE 34IN LL (TOURNIQUET CUFF) ×2 IMPLANT
CUFF TOURNIQUET SINGLE 44IN (TOURNIQUET CUFF) IMPLANT
DRAPE EXTREMITY T 121X128X90 (DRAPE) ×2 IMPLANT
DRAPE U-SHAPE 47X51 STRL (DRAPES) ×2 IMPLANT
DRSG ADAPTIC 3X8 NADH LF (GAUZE/BANDAGES/DRESSINGS) ×2 IMPLANT
DRSG PAD ABDOMINAL 8X10 ST (GAUZE/BANDAGES/DRESSINGS) ×2 IMPLANT
DURAPREP 26ML APPLICATOR (WOUND CARE) ×2 IMPLANT
ELECT REM PT RETURN 9FT ADLT (ELECTROSURGICAL) ×2
ELECTRODE REM PT RTRN 9FT ADLT (ELECTROSURGICAL) ×1 IMPLANT
EVACUATOR 1/8 PVC DRAIN (DRAIN) ×2 IMPLANT
FACESHIELD LNG OPTICON STERILE (SAFETY) ×2 IMPLANT
GAUZE XEROFORM 5X9 LF (GAUZE/BANDAGES/DRESSINGS) ×2 IMPLANT
GLOVE BIOGEL PI IND STRL 6.5 (GLOVE) ×1 IMPLANT
GLOVE BIOGEL PI IND STRL 8 (GLOVE) ×2 IMPLANT
GLOVE BIOGEL PI INDICATOR 6.5 (GLOVE) ×1
GLOVE BIOGEL PI INDICATOR 8 (GLOVE) ×2
GLOVE ECLIPSE 6.5 STRL STRAW (GLOVE) ×2 IMPLANT
GLOVE ECLIPSE 7.5 STRL STRAW (GLOVE) ×4 IMPLANT
GOWN PREVENTION PLUS LG XLONG (DISPOSABLE) IMPLANT
GOWN STRL NON-REIN LRG LVL3 (GOWN DISPOSABLE) ×4 IMPLANT
GOWN STRL REIN XL XLG (GOWN DISPOSABLE) ×4 IMPLANT
HANDPIECE INTERPULSE COAX TIP (DISPOSABLE) ×1
HOOD PEEL AWAY FACE SHEILD DIS (HOOD) ×6 IMPLANT
IMMOBILIZER KNEE 20 (SOFTGOODS)
IMMOBILIZER KNEE 20 THIGH 36 (SOFTGOODS) IMPLANT
IMMOBILIZER KNEE 22 UNIV (SOFTGOODS) ×2 IMPLANT
IMMOBILIZER KNEE 24 THIGH 36 (MISCELLANEOUS) IMPLANT
IMMOBILIZER KNEE 24 UNIV (MISCELLANEOUS)
KIT BASIN OR (CUSTOM PROCEDURE TRAY) ×2 IMPLANT
KIT ROOM TURNOVER OR (KITS) ×2 IMPLANT
MANIFOLD NEPTUNE II (INSTRUMENTS) ×2 IMPLANT
NEEDLE HYPO 25GX1X1/2 BEV (NEEDLE) IMPLANT
NS IRRIG 1000ML POUR BTL (IV SOLUTION) ×2 IMPLANT
PACK TOTAL JOINT (CUSTOM PROCEDURE TRAY) ×2 IMPLANT
PAD ARMBOARD 7.5X6 YLW CONV (MISCELLANEOUS) ×4 IMPLANT
PAD CAST 4YDX4 CTTN HI CHSV (CAST SUPPLIES) ×1 IMPLANT
PADDING CAST ABS 4INX4YD NS (CAST SUPPLIES) ×1
PADDING CAST ABS 6INX4YD NS (CAST SUPPLIES) ×1
PADDING CAST ABS COTTON 4X4 ST (CAST SUPPLIES) ×1 IMPLANT
PADDING CAST ABS COTTON 6X4 NS (CAST SUPPLIES) ×1 IMPLANT
PADDING CAST COTTON 4X4 STRL (CAST SUPPLIES) ×1
SET HNDPC FAN SPRY TIP SCT (DISPOSABLE) ×1 IMPLANT
SPONGE GAUZE 4X4 12PLY (GAUZE/BANDAGES/DRESSINGS) ×2 IMPLANT
STAPLER VISISTAT 35W (STAPLE) ×2 IMPLANT
STRIP CLOSURE SKIN 1/2X4 (GAUZE/BANDAGES/DRESSINGS) ×2 IMPLANT
SUCTION FRAZIER TIP 10 FR DISP (SUCTIONS) ×2 IMPLANT
SUT MON AB 3-0 SH 27 (SUTURE)
SUT MON AB 3-0 SH27 (SUTURE) IMPLANT
SUT VIC AB 0 CTB1 27 (SUTURE) ×4 IMPLANT
SUT VIC AB 1 CT1 27 (SUTURE) ×2
SUT VIC AB 1 CT1 27XBRD ANBCTR (SUTURE) ×2 IMPLANT
SUT VIC AB 2-0 CTB1 (SUTURE) ×4 IMPLANT
SYR CONTROL 10ML LL (SYRINGE) IMPLANT
TOWEL OR 17X24 6PK STRL BLUE (TOWEL DISPOSABLE) ×2 IMPLANT
TOWEL OR 17X26 10 PK STRL BLUE (TOWEL DISPOSABLE) ×2 IMPLANT
TRAY FOLEY CATH 14FR (SET/KITS/TRAYS/PACK) ×2 IMPLANT
WATER STERILE IRR 1000ML POUR (IV SOLUTION) ×2 IMPLANT

## 2011-12-24 NOTE — Plan of Care (Signed)
Problem: Consults Goal: Diagnosis- Total Joint Replacement Primary Total Knee     

## 2011-12-24 NOTE — Anesthesia Preprocedure Evaluation (Addendum)
Anesthesia Evaluation  Patient identified by MRN, date of birth, ID band Patient awake    Reviewed: Allergy & Precautions, H&P , NPO status , Patient's Chart, lab work & pertinent test results  History of Anesthesia Complications Negative for: history of anesthetic complications  Airway Mallampati: II TM Distance: >3 FB Neck ROM: Full    Dental  (+) Teeth Intact and Dental Advisory Given   Pulmonary neg pulmonary ROS,  breath sounds clear to auscultation        Cardiovascular hypertension, Pt. on medications Rhythm:Regular Rate:Normal     Neuro/Psych    GI/Hepatic   Endo/Other    Renal/GU      Musculoskeletal   Abdominal   Peds  Hematology   Anesthesia Other Findings   Reproductive/Obstetrics                           Anesthesia Physical Anesthesia Plan  ASA: II  Anesthesia Plan: General   Post-op Pain Management:    Induction: Intravenous  Airway Management Planned: LMA  Additional Equipment:   Intra-op Plan:   Post-operative Plan: Extubation in OR  Informed Consent:   Dental advisory given  Plan Discussed with: CRNA and Surgeon  Anesthesia Plan Comments:         Anesthesia Quick Evaluation

## 2011-12-24 NOTE — Anesthesia Procedure Notes (Addendum)
Anesthesia Regional Block:  Femoral nerve block  Pre-Anesthetic Checklist: ,, timeout performed, Correct Patient, Correct Site, Correct Laterality, Correct Procedure, Correct Position, site marked, Risks and benefits discussed, at surgeon's request and post-op pain management  Laterality: Right  Prep: Betadine       Needles:  Injection technique: Single-shot  Needle Type: Stimulator Needle - 80      Needle Gauge: 22 and 22 G  Needle insertion depth: 6 cm   Additional Needles:  Procedures: nerve stimulator Femoral nerve block  Nerve Stimulator or Paresthesia:  Response: Twitch elicited, 0.8 mA,   Additional Responses:   Narrative:  Start time: 12/24/2011 7:30 AM End time: 12/24/2011 7:44 AM Injection made incrementally with aspirations every 5 mL.  Performed by: Personally  Anesthesiologist: Alma Friendly, MD  Additional Notes: BP cuff, EKG monitors applied. Sedation begun. Femoral artery palpated for location of nerve. After nerve location anesthetic injected incrementally, slowly , and after neg aspirations. Tolerated well.  Femoral nerve block Procedure Name: LMA Insertion Date/Time: 12/24/2011 8:54 AM Performed by: Romie Minus K Pre-anesthesia Checklist: Patient identified, Emergency Drugs available, Suction available, Patient being monitored and Timeout performed Patient Re-evaluated:Patient Re-evaluated prior to inductionOxygen Delivery Method: Circle system utilized Preoxygenation: Pre-oxygenation with 100% oxygen Intubation Type: IV induction Ventilation: Mask ventilation without difficulty LMA: LMA inserted LMA Size: 4.0 Number of attempts: 1 Placement Confirmation: ETT inserted through vocal cords under direct vision,  positive ETCO2 and breath sounds checked- equal and bilateral Tube secured with: Tape Dental Injury: Teeth and Oropharynx as per pre-operative assessment

## 2011-12-24 NOTE — Preoperative (Signed)
Beta Blockers   Reason not to administer Beta Blockers:Not Applicable 

## 2011-12-24 NOTE — H&P (Signed)
PREOPERATIVE H&P  Chief Complaint: r. Knee pain  HPI: Shelley Black is a 78 y.o. female who presents for evaluation of r. Knee pain. It has been present for greater than 6 mon and has been worsening. She has failed conservative measures. Pain is rated as moderate.  Past Medical History  Diagnosis Date  . Heart murmur     not seen by cardiologist  . Hypertension   . Arthritis    Past Surgical History  Procedure Date  . Joint replacement     Left knee  . Thyroid surgery   . Appendectomy   . Eye surgery     cataract bil   History   Social History  . Marital Status: Married    Spouse Name: N/A    Number of Children: N/A  . Years of Education: N/A   Social History Main Topics  . Smoking status: Never Smoker   . Smokeless tobacco: Not on file  . Alcohol Use: No  . Drug Use: No  . Sexually Active: Not on file   Other Topics Concern  . Not on file   Social History Narrative  . No narrative on file   No family history on file. No Known Allergies Prior to Admission medications   Medication Sig Start Date End Date Taking? Authorizing Provider  aspirin EC 325 MG tablet Take 325 mg by mouth daily.   Yes Historical Provider, MD  Cholecalciferol (VITAMIN D3) 2000 UNITS TABS Take 1 tablet by mouth daily.   Yes Historical Provider, MD  CINNAMON PO Take 2,000 mg by mouth daily.   Yes Historical Provider, MD  hydrochlorothiazide (HYDRODIURIL) 25 MG tablet Take 25 mg by mouth daily.   Yes Historical Provider, MD  LUTEIN PO Take 1 tablet by mouth daily.   Yes Historical Provider, MD  Multiple Vitamin (MULTIVITAMIN WITH MINERALS) TABS Take 1 tablet by mouth daily.   Yes Historical Provider, MD  Omega-3 Fatty Acids (FISH OIL TRIPLE STRENGTH PO) Take 1 capsule by mouth daily.   Yes Historical Provider, MD  pravastatin (PRAVACHOL) 40 MG tablet Take 40 mg by mouth daily.   Yes Historical Provider, MD  QUEtiapine (SEROQUEL) 25 MG tablet Take 25 mg by mouth at bedtime.   Yes  Historical Provider, MD  ramipril (ALTACE) 10 MG capsule Take 10 mg by mouth 2 (two) times daily.   Yes Historical Provider, MD     Positive ROS: none  All other systems have been reviewed and were otherwise negative with the exception of those mentioned in the HPI and as above.  Physical Exam: Filed Vitals:   12/24/11 0619  BP: 162/89  Pulse: 84  Temp: 97.9 F (36.6 C)  Resp: 20    General: Alert, no acute distress Cardiovascular: No pedal edema Respiratory: No cyanosis, no use of accessory musculature GI: No organomegaly, abdomen is soft and non-tender Skin: No lesions in the area of chief complaint Neurologic: Sensation intact distally Psychiatric: Patient is competent for consent with normal mood and affect Lymphatic: No axillary or cervical lymphadenopathy  MUSCULOSKELETAL: r. Knee : +ttp over med side ROM 5-110 deg// no instability  Assessment/Plan: degenerative joint disease Plan for Procedure(s): TOTAL KNEE ARTHROPLASTY  The risks benefits and alternatives were discussed with the patient including but not limited to the risks of nonoperative treatment, versus surgical intervention including infection, bleeding, nerve injury, malunion, nonunion, hardware prominence, hardware failure, need for hardware removal, blood clots, cardiopulmonary complications, morbidity, mortality, among others, and they were willing to proceed.  Predicted outcome is good, although there will be at least a six to nine month expected recovery.  Lexii Walsh L, MD 12/24/2011 7:32 AM

## 2011-12-24 NOTE — Anesthesia Postprocedure Evaluation (Signed)
  Anesthesia Post-op Note  Patient: Shelley Black  Procedure(s) Performed: Procedure(s) (LRB): TOTAL KNEE ARTHROPLASTY (Right)  Patient Location: PACU  Anesthesia Type: GA combined with regional for post-op pain  Level of Consciousness: awake  Airway and Oxygen Therapy: Patient Spontanous Breathing  Post-op Pain: mild  Post-op Assessment: Post-op Vital signs reviewed  Post-op Vital Signs: stable  Complications: No apparent anesthesia complications

## 2011-12-24 NOTE — Transfer of Care (Signed)
Immediate Anesthesia Transfer of Care Note  Patient: Shelley Black  Procedure(s) Performed: Procedure(s) (LRB): TOTAL KNEE ARTHROPLASTY (Right)  Patient Location: PACU  Anesthesia Type: General  Level of Consciousness: awake, oriented and patient cooperative  Airway & Oxygen Therapy: Patient Spontanous Breathing and Patient connected to nasal cannula oxygen  Post-op Assessment: Report given to PACU RN and Post -op Vital signs reviewed and stable  Post vital signs: Reviewed  Complications: No apparent anesthesia complications

## 2011-12-24 NOTE — Progress Notes (Signed)
UR COMPLETED  

## 2011-12-24 NOTE — Progress Notes (Signed)
ANTICOAGULATION CONSULT NOTE - Initial Consult  Pharmacy Consult for coumadin Indication: VTE prophylaxis  No Known Allergies  Patient Measurements:   Heparin Dosing Weight:   Vital Signs: Temp: 97 F (36.1 C) (08/23 1056) Temp src: Oral (08/23 0619) BP: 162/89 mmHg (08/23 0619) Pulse Rate: 68  (08/23 1100)  Labs: No results found for this basename: HGB:2,HCT:3,PLT:3,APTT:3,LABPROT:3,INR:3,HEPARINUNFRC:3,CREATININE:3,CKTOTAL:3,CKMB:3,TROPONINI:3 in the last 72 hours  The CrCl is unknown because both a height and weight (above a minimum accepted value) are required for this calculation.   Medical History: Past Medical History  Diagnosis Date  . Heart murmur     not seen by cardiologist  . Hypertension   . Arthritis     Medications:  Scheduled:    .  ceFAZolin (ANCEF) IV  2 g Intravenous 60 min Pre-Op  . HYDROmorphone      . DISCONTD: povidone-iodine   Topical Once   Infusions:    . dextrose 5 % and 0.45% NaCl      Assessment: 78 yo female s/p TKA will be started on coumadin for VTE prophylaxis.  INR on 08/19 was 0.92.  Coumadin score = 2   Goal of Therapy:  INR 1.5-2 Monitor platelets by anticoagulation protocol: Yes   Plan:  1) Coumadin 2.5mg  po x1 2) Daily PT/INR 3) Coumadin booklet and video. 4) Rec to d/c ketorolac while on coumadin.  Geraline Halberstadt, Tsz-Yin 12/24/2011,11:11 AM

## 2011-12-24 NOTE — Brief Op Note (Signed)
12/24/2011  10:40 AM  PATIENT:  Shelley Black  78 y.o. female  PRE-OPERATIVE DIAGNOSIS:  degenerative joint disease  POST-OPERATIVE DIAGNOSIS:  degenerative joint disease  PROCEDURE:  Procedure(s) (LRB): TOTAL KNEE ARTHROPLASTY (Right)  SURGEON:  Surgeon(s) and Role:    * Harvie Junior, MD - Primary  PHYSICIAN ASSISTANT:   ASSISTANTS: bethune   ANESTHESIA:   general  EBL:  Total I/O In: 1500 [I.V.:1500] Out: 250 [Urine:150; Blood:100]  BLOOD ADMINISTERED:none  DRAINS: (1) Hemovact drain(s) in the r knee with  Suction Open   LOCAL MEDICATIONS USED:  NONE  SPECIMEN:  No Specimen  DISPOSITION OF SPECIMEN:  N/A  COUNTS:  YES  TOURNIQUET:   Total Tourniquet Time Documented: Thigh (Right) - 63 minutes  DICTATION: .Other Dictation: Dictation Number [id number]  PLAN OF CARE: Admit to inpatient   PATIENT DISPOSITION:  PACU - hemodynamically stable.   Delay start of Pharmacological VTE agent (>24hrs) due to surgical blood loss or risk of bleeding:no

## 2011-12-24 NOTE — Progress Notes (Signed)
Receiving report from [facility] as primary rn. No previous assessments documents--Phillip aware.

## 2011-12-24 NOTE — Progress Notes (Signed)
Orthopedic Tech Progress Note Patient Details:  Shelley Black 1931-04-28 [id number] CPM applied to Right LE, OHF with trapeze placed on patient bed. CPM Right Knee CPM Right Knee: On Right Knee Flexion (Degrees): 60  Right Knee Extension (Degrees): 0    Asia R Thompson 12/24/2011, 10:34 AM

## 2011-12-25 DIAGNOSIS — E876 Hypokalemia: Secondary | ICD-10-CM | POA: Diagnosis not present

## 2011-12-25 DIAGNOSIS — M1710 Unilateral primary osteoarthritis, unspecified knee: Secondary | ICD-10-CM | POA: Diagnosis not present

## 2011-12-25 LAB — BASIC METABOLIC PANEL
BUN: 14 mg/dL (ref 6–23)
Chloride: 96 mEq/L (ref 96–112)
Creatinine, Ser: 0.86 mg/dL (ref 0.50–1.10)
GFR calc Af Amer: 73 mL/min — ABNORMAL LOW (ref >90)
Glucose, Bld: 118 mg/dL — ABNORMAL HIGH (ref 70–99)
Potassium: 3.2 mEq/L — ABNORMAL LOW (ref 3.5–5.1)

## 2011-12-25 LAB — CBC
HCT: 28.1 % — ABNORMAL LOW (ref 36.0–46.0)
Hemoglobin: 9.6 g/dL — ABNORMAL LOW (ref 12.0–15.0)
MCHC: 34.2 g/dL (ref 30.0–36.0)
MCV: 93.4 fL (ref 78.0–100.0)
RDW: 13.4 % (ref 11.5–15.5)
WBC: 8.9 10*3/uL (ref 4.0–10.5)

## 2011-12-25 MED ORDER — POTASSIUM CHLORIDE CRYS ER 20 MEQ PO TBCR
20.0000 meq | EXTENDED_RELEASE_TABLET | Freq: Two times a day (BID) | ORAL | Status: DC
  Administered 2011-12-25 – 2011-12-28 (×7): 20 meq via ORAL
  Filled 2011-12-25 (×11): qty 1

## 2011-12-25 MED ORDER — WARFARIN SODIUM 2.5 MG PO TABS
2.5000 mg | ORAL_TABLET | Freq: Once | ORAL | Status: AC
  Administered 2011-12-25: 2.5 mg via ORAL
  Filled 2011-12-25: qty 1

## 2011-12-25 MED ORDER — WHITE PETROLATUM GEL
Status: AC
  Administered 2011-12-25: 18:00:00
  Filled 2011-12-25: qty 5

## 2011-12-25 NOTE — Progress Notes (Signed)
Subjective: 1 Day Post-Op Procedure(s) (LRB): TOTAL KNEE ARTHROPLASTY (Right) Patient reports pain as 2 on 0-10 scale.   Taking po ok. Foley out this am. No dizziness or light headedness.  Objective: Vital signs in last 24 hours: Temp:  [97 F (36.1 C)-99.4 F (37.4 C)] 98.3 F (36.8 C) (08/24 0530) Pulse Rate:  [67-108] 71  (08/24 0530) Resp:  [7-16] 16  (08/24 0530) BP: (115-162)/(55-83) 115/55 mmHg (08/24 0530) SpO2:  [91 %-100 %] 96 % (08/24 0530) Weight:  [68.04 kg (150 lb)] 68.04 kg (150 lb) (08/23 1230)  Intake/Output from previous day: 08/23 0701 - 08/24 0700 In: 2805 [I.V.:2555; IV Piggyback:250] Out: 2250 [Urine:1700; Drains:450; Blood:100] Intake/Output this shift:     [facility] 12/25/11 0620  HGB 9.6*    Basename 12/25/11 0620  WBC 8.9  RBC 3.01*  HCT 28.1*  PLT 299    Basename 12/25/11 0620  NA 133*  K 3.2*  CL 96  CO2 26  BUN 14  CREATININE 0.86  GLUCOSE 118*  CALCIUM 8.4    Basename 12/25/11 0620  LABPT --  INR 1.18  Right knee exam:  Neurovascular intact Sensation intact distally Intact pulses distally Dorsiflexion/Plantar flexion intact Incision: dressing C/D/I Compartment soft  Assessment/Plan:  1 Day Post-Op Procedure(s) (LRB): TOTAL KNEE ARTHROPLASTY (Right) Hypokalemia Acute blood loss anemia  Asymptomatic Plan: Up with therapy Add Potassium orally and check BMET in AM Cont oral iron and check CBC in am. Coumadin for VTE prophylaxis Home in 1-2 days.  Rondel Episcopo G 12/25/2011, 8:27 AM

## 2011-12-25 NOTE — Progress Notes (Signed)
ANTICOAGULATION CONSULT NOTE - Follow Up Consult  Pharmacy Consult for warfarin Indication: VTE prophylaxis  No Known Allergies  Patient Measurements: Height: 5\' 5"  (165.1 cm) Weight: 150 lb (68.04 kg) IBW/kg (Calculated) : 57   Vital Signs: Temp: 98.3 F (36.8 C) (08/24 0530) BP: 115/55 mmHg (08/24 0530) Pulse Rate: 71  (08/24 0530)  Labs:  Basename 12/25/11 0620  HGB 9.6*  HCT 28.1*  PLT 299  APTT --  LABPROT 15.2  INR 1.18  HEPARINUNFRC --  CREATININE 0.86  CKTOTAL --  CKMB --  TROPONINI --    Estimated Creatinine Clearance: 47.7 ml/min (by C-G formula based on Cr of 0.86).   Medications:  Prescriptions prior to admission  Medication Sig Dispense Refill  . aspirin EC 325 MG tablet Take 325 mg by mouth daily.      . Cholecalciferol (VITAMIN D3) 2000 UNITS TABS Take 1 tablet by mouth daily.      Marland Kitchen CINNAMON PO Take 2,000 mg by mouth daily.      . hydrochlorothiazide (HYDRODIURIL) 25 MG tablet Take 25 mg by mouth daily.      . LUTEIN PO Take 1 tablet by mouth daily.      . Multiple Vitamin (MULTIVITAMIN WITH MINERALS) TABS Take 1 tablet by mouth daily.      . Omega-3 Fatty Acids (FISH OIL TRIPLE STRENGTH PO) Take 1 capsule by mouth daily.      . pravastatin (PRAVACHOL) 40 MG tablet Take 40 mg by mouth daily.      . QUEtiapine (SEROQUEL) 25 MG tablet Take 25 mg by mouth at bedtime.      . ramipril (ALTACE) 10 MG capsule Take 10 mg by mouth 2 (two) times daily.        Assessment: 78 y/o female on Coumadin for VTE prophylaxis s/p TKR on 8/23.  Coumadin score is 2. INR today is 1.18 after one 2.5 mg dose last night.    Goal of Therapy:  INR goal 1.4-2.0, per MD Monitor platelets by anticoagulation protocol: Yes   Plan:  1. Coumadin 2.5 mg po x 1 tonight 2. Daily PT/INR   Doris Cheadle, PharmD Clinical Pharmacist Pager: [phone] Phone: [phone] 12/25/2011 11:17 AM

## 2011-12-25 NOTE — Op Note (Signed)
NAMEVERNIECE, Black           ACCOUNT NO.:  [id number]  MEDICAL RECORD NO.:  [id number]  LOCATION:  [id number]                        FACILITY:  [facility]  PHYSICIAN:  Harvie Junior, M.D.   DATE OF BIRTH:  1930/06/03  DATE OF PROCEDURE:  12/24/2011 DATE OF DISCHARGE:                              OPERATIVE REPORT   PREOPERATIVE DIAGNOSIS:  End-stage degenerative joint disease, right knee.  POSTOPERATIVE DIAGNOSIS:  End-stage degenerative joint disease, right knee.  PRINCIPAL PROCEDURE:  Right total knee replacement with a Sigma system, size 3 femur, size 3 tibia, 10-mm bridging bearing, and a 35-mm all- polyethylene patella.  SURGEON:  Harvie Junior, MD  ASSISTANT:  Marshia Ly, PA  ANESTHESIA:  General.  BRIEF HISTORY:  Shelley Black is a 78 year old female with a long history of having severe degenerative joint disease of the right knee.  She has had a left total knee replacement and had done well with that, and because of continued complaints of pain in the right knee, she was ultimately taken to the operating room for right total knee replacement. Preoperative x-ray showed severe bone-on-bone changes with medial subluxation.  She was brought to the operating room for this procedure.  PROCEDURE:  The patient was brought to the operating room and after adequate level of anesthesia was obtained with general anesthetic, the patient was placed in supine on the operating table.  The right leg was then prepped and draped in usual sterile fashion.  Following this, the leg was exsanguinated, blood pressure tourniquet was inflated to 350 mmHg.  Following this, a midline incision was made to the subcutaneous down to the level of the extensor mechanism.  Then, medial parapatellar arthrotomy was undertaken.  Once this was done, attention was turned towards the knee where the medial and lateral meniscus were removed, retropatellar fat pad was removed, synovium in the anterior aspect  of the femur was removed, and attention was then turned distally.  Once this was completed, the tibia was exposed and the tibia was cut perpendicular to its long axis with an extramedullary alignment guide. Once this was done, a central hole was made in the femur and this was elevated and then guide rod was put up the distal femur, 10 mm cut off for the distal femur and 5 degrees of valgus alignment.  Once this was done, a spacer block was put in place and gained easy full extension. Once this was completed, attention was turned back to the femur, which was sized to a 3, and then with a spacer block, the rotational alignment was achieved to rotate the femur at 90 degrees to the tibial cut, which was then pinned in place.  Once this was done, an angel-wing was used and the #3 guide block was placed and then screwed into place.  Anterior and posterior cuts were made, chamfer cuts, and then box, and the attention was then turned to the tibia, which was sized to a 3, was drilled and keeled and then, then final trial was put in place, size 3 of the tibia, size 3 on the femur, and a 10-mm bridging bearing trial, which came into easy full extension.  Once that was completed, the attention was  turned towards the patella, which was cut down to the level of 13 mm and then a 35 paddle was chosen and lugs were drilled. Once that was completed, attention was turned towards range of motion of the trials, which was excellent range of motion, patellar tracking was midline.  All trial components were removed.  The knee was then copiously and thoroughly lavaged and then suctioned dry.  The final components were then cemented into place, size 3 tibia, size 3 femur, and a 10-mm bridging bearing trial was placed.  The 35 poly patella was placed with a clamp and then all the cement was allowed to harden.  Once the cement was completely hardened, all excess bone cement had been removed.  We trial a 12.5 briefly  too tight, went back with a 10 and the 10 was opened and a 10 trial was placed.  All bleedings were controlled with electrocautery prior to placement of the final poly and then tourniquet had been let down.  Total tourniquet time was 63 minutes. Once this was done, then the medium Hemovac drain was placed, the medial parapatellar arthrotomy was closed with 1 Vicryl running, skin with 0 and 2-0 Vicryl and staples.  Sterile compressive dressing was applied. The patient was taken to the recovery room and she was noted to be in satisfactory condition.  Estimated blood loss for this procedure was none.     Harvie Junior, M.D.     Ranae Plumber  D:  12/24/2011  T:  12/25/2011  Job:  [id number]

## 2011-12-25 NOTE — Evaluation (Signed)
Physical Therapy Evaluation Patient Details Name: Shelley Black MRN: [id number] DOB: [birth year] Today's Date: 12/25/2011 Time: 8295-6213 PT Time Calculation (min): 32 min  PT Assessment / Plan / Recommendation Clinical Impression  patient s/p R TKR.  Patient limited today by Right LE buckling during gait, required additional guarding and support.  Encouraged patient to perform quad set throughout today to increase strength Right knee; however, this may delay d/c.  Feel patient will make steady progress and will benefit from PT to increase mobility and independence.                       PT Assessment  Patient needs continued PT services    Follow Up Recommendations  Home health PT    Barriers to Discharge        Equipment Recommendations  3 in 1 bedside comode;None recommended by SLP    Recommendations for Other Services     Frequency 7X/week    Precautions / Restrictions Precautions Precautions: Knee Required Braces or Orthoses: Knee Immobilizer - Right Knee Immobilizer - Right: On except when in CPM (until discontinued) Restrictions RLE Weight Bearing: Weight bearing as tolerated   Pertinent Vitals/Pain No significant pain      Mobility  Bed Mobility Bed Mobility: Supine to Sit Supine to Sit: 4: Min assist Details for Bed Mobility Assistance: min assist for RLE support, trunk support Transfers Transfers: Sit to Stand;Stand to Sit Sit to Stand: 1: +2 Total assist;With upper extremity assist Sit to Stand: Patient Percentage: 70% Stand to Sit: 3: Mod assist;With upper extremity assist Details for Transfer Assistance: patient c/o knee buckling, +2 assist for safety Ambulation/Gait Ambulation/Gait Assistance: 1: +2 Total assist Ambulation/Gait: Patient Percentage: 60% Ambulation Distance (Feet): 10 Feet Assistive device: Rolling walker Ambulation/Gait Assistance Details: +2 assist for safety, patient c/o knee buckling Gait Pattern: Step-to pattern    Exercises  Total Joint Exercises Ankle Circles/Pumps: AROM;Both;10 reps;Seated;Strengthening Quad Sets: AROM;Both;10 reps;Seated;Strengthening Gluteal Sets: AROM;Both;Strengthening;10 reps;Seated Long Arc Quad: AAROM;Strengthening;Right;10 reps;Seated Knee Flexion: AAROM;Right;10 reps;Seated   PT Diagnosis: Difficulty walking  PT Problem List: Decreased strength;Decreased range of motion;Decreased activity tolerance;Decreased mobility;Decreased knowledge of use of DME PT Treatment Interventions: DME instruction;Gait training;Stair training;Functional mobility training;Therapeutic activities;Therapeutic exercise;Patient/family education   PT Goals Acute Rehab PT Goals PT Goal Formulation: With patient Time For Goal Achievement: 01/01/12 Potential to Achieve Goals: Good Pt will go Supine/Side to Sit: with modified independence;with rail;with HOB 0 degrees PT Goal: Supine/Side to Sit - Progress: Goal set today Pt will go Sit to Supine/Side: with modified independence;with rail;with HOB 0 degrees PT Goal: Sit to Supine/Side - Progress: Goal set today Pt will go Sit to Stand: with modified independence;with upper extremity assist PT Goal: Sit to Stand - Progress: Goal set today Pt will go Stand to Sit: with modified independence;with upper extremity assist PT Goal: Stand to Sit - Progress: Goal set today Pt will Ambulate: 51 - 150 feet;with modified independence;with rolling walker PT Goal: Ambulate - Progress: Goal set today Pt will Go Up / Down Stairs: Flight;with min assist;with rail(s);with least restrictive assistive device PT Goal: Up/Down Stairs - Progress: Goal set today Pt will Perform Home Exercise Program: Independently PT Goal: Perform Home Exercise Program - Progress: Goal set today  Visit Information  Last PT Received On: 12/25/11 Assistance Needed: +2    Subjective Data  Subjective: patient worried that her right leg is buckling Patient Stated Goal: go home tomorrow   Prior  Functioning  Home Living Lives With:  Spouse Available Help at Discharge: Family Type of Home: House Home Access: Stairs to enter Secretary/administrator of Steps: 1 Entrance Stairs-Rails: None Home Layout: Multi-level Alternate Level Stairs-Number of Steps: flight between stories (15 steps) Alternate Level Stairs-Rails: Right Home Adaptive Equipment: Walker - rolling;Straight cane Prior Function Level of Independence: Independent Able to Take Stairs?: Yes Driving: Yes Communication Communication: No difficulties    Cognition  Overall Cognitive Status: Appears within functional limits for tasks assessed/performed Arousal/Alertness: Awake/alert Orientation Level: Appears intact for tasks assessed Behavior During Session: [facility] for tasks performed    Extremity/Trunk Assessment Right Upper Extremity Assessment RUE ROM/Strength/Tone: Within functional levels Left Upper Extremity Assessment LUE ROM/Strength/Tone: Within functional levels Right Lower Extremity Assessment RLE ROM/Strength/Tone: Deficits RLE ROM/Strength/Tone Deficits: ankle WFL; knee extension - 1/5; limited ROM due to surgery/bandage Left Lower Extremity Assessment LLE ROM/Strength/Tone: Within functional levels Trunk Assessment Trunk Assessment: Normal   Balance    End of Session PT - End of Session Equipment Utilized During Treatment: Gait belt Activity Tolerance: Patient tolerated treatment well Patient left: in chair;with call bell/phone within reach;with family/visitor present Nurse Communication: Mobility status CPM Right Knee CPM Right Knee: Off Right Knee Flexion (Degrees): 60  Right Knee Extension (Degrees): 0  Additional Comments: removed CPM for PT session  GP     Olivia Canter, Singer [phone] 12/25/2011, 12:01 PM

## 2011-12-25 NOTE — Progress Notes (Signed)
Physical Therapy Treatment Patient Details Name: Shelley Black MRN: [id number] DOB: 14-Jun-1930 Today's Date: 12/25/2011 Time: 1500-1520 PT Time Calculation (min): 20 min  PT Assessment / Plan / Recommendation Comments on Treatment Session  patient did much better this afternoon.  No obvious knee buckling and quad appears stronger than am.  patient able to increase ambulation distance and required less assistance.  Patient would like to go home on Sunday - unsure if this is feasible.    Follow Up Recommendations  Home health PT    Barriers to Discharge        Equipment Recommendations  3 in 1 bedside comode    Recommendations for Other Services    Frequency 7X/week   Plan Discharge plan remains appropriate;Frequency remains appropriate    Precautions / Restrictions Precautions Precautions: Knee Required Braces or Orthoses: Knee Immobilizer - Right Knee Immobilizer - Right: On except when in CPM (until discontinued) Restrictions RLE Weight Bearing: Weight bearing as tolerated   Pertinent Vitals/Pain n/a    Mobility  Bed Mobility Bed Mobility: Supine to Sit Supine to Sit: 4: Min guard Details for Bed Mobility Assistance: required guarding assist for shoulders moving to edge of bed Transfers Transfers: Sit to Stand;Stand to Sit Sit to Stand: 4: Min assist Sit to Stand: Patient Percentage: 70% Stand to Sit: 4: Min assist Details for Transfer Assistance: min assist to control ascent/descent Ambulation/Gait Ambulation/Gait Assistance: 4: Min guard Ambulation/Gait: Patient Percentage: 60% Ambulation Distance (Feet): 30 Feet Assistive device: Rolling walker Ambulation/Gait Assistance Details: min guard for safety as knee was buckling this am Gait Pattern: Step-to pattern    Exercises Total Joint Exercises Ankle Circles/Pumps: AROM;Both;10 reps;Supine Quad Sets: AROM;Both;10 reps;Supine;Strengthening Gluteal Sets: AROM;Strengthening;Both;10 reps;Supine Short Arc Quad:  AAROM;Strengthening;Right;10 reps;Supine Heel Slides: AAROM;Strengthening;Right;10 reps;Supine Hip ABduction/ADduction: AROM;Right;10 reps;Supine Straight Leg Raises: AAROM;Strengthening;Right;10 reps;Supine Long Arc Quad: AAROM;Strengthening;Right;10 reps;Seated Knee Flexion: AAROM;Right;10 reps;Seated   PT Diagnosis: Difficulty walking  PT Problem List: Decreased strength;Decreased range of motion;Decreased activity tolerance;Decreased mobility;Decreased knowledge of use of DME PT Treatment Interventions: DME instruction;Gait training;Stair training;Functional mobility training;Therapeutic activities;Therapeutic exercise;Patient/family education   PT Goals Acute Rehab PT Goals PT Goal Formulation: With patient Time For Goal Achievement: 01/01/12 Potential to Achieve Goals: Good Pt will go Supine/Side to Sit: with modified independence;with rail;with HOB 0 degrees PT Goal: Supine/Side to Sit - Progress: Progressing toward goal Pt will go Sit to Supine/Side: with modified independence;with rail;with HOB 0 degrees PT Goal: Sit to Supine/Side - Progress: Goal set today Pt will go Sit to Stand: with modified independence;with upper extremity assist PT Goal: Sit to Stand - Progress: Progressing toward goal Pt will go Stand to Sit: with modified independence;with upper extremity assist PT Goal: Stand to Sit - Progress: Progressing toward goal Pt will Ambulate: 51 - 150 feet;with modified independence;with rolling walker PT Goal: Ambulate - Progress: Progressing toward goal Pt will Go Up / Down Stairs: Flight;with min assist;with rail(s);with least restrictive assistive device PT Goal: Up/Down Stairs - Progress: Goal set today Pt will Perform Home Exercise Program: Independently PT Goal: Perform Home Exercise Program - Progress: Progressing toward goal  Visit Information  Last PT Received On: 12/25/11 Assistance Needed: +1    Subjective Data  Subjective: states her leg is feeling  stronger this pm Patient Stated Goal: go home tomorrow   Cognition  Overall Cognitive Status: Appears within functional limits for tasks assessed/performed Arousal/Alertness: Awake/alert Orientation Level: Appears intact for tasks assessed Behavior During Session: [facility] for tasks performed    Balance  End of Session PT - End of Session Equipment Utilized During Treatment: Gait belt Activity Tolerance: Patient tolerated treatment well Patient left: in chair;with family/visitor present Nurse Communication: Mobility status CPM Right Knee CPM Right Knee: Off Additional Comments: removed CPM for PT session   GP     Shelley Black, Shelley Black [phone] 12/25/2011, 3:31 PM

## 2011-12-26 ENCOUNTER — Encounter (HOSPITAL_COMMUNITY): Payer: Self-pay | Admitting: Orthopedic Surgery

## 2011-12-26 DIAGNOSIS — M1710 Unilateral primary osteoarthritis, unspecified knee: Secondary | ICD-10-CM | POA: Diagnosis not present

## 2011-12-26 DIAGNOSIS — E876 Hypokalemia: Secondary | ICD-10-CM | POA: Diagnosis not present

## 2011-12-26 LAB — BASIC METABOLIC PANEL
Chloride: 93 mEq/L — ABNORMAL LOW (ref 96–112)
GFR calc Af Amer: 90 mL/min — ABNORMAL LOW (ref >90)
Potassium: 3.8 mEq/L (ref 3.5–5.1)

## 2011-12-26 LAB — CBC
Platelets: 322 10*3/uL (ref 150–400)
RBC: 3.05 MIL/uL — ABNORMAL LOW (ref 3.87–5.11)
RDW: 13.3 % (ref 11.5–15.5)
WBC: 12.7 10*3/uL — ABNORMAL HIGH (ref 4.0–10.5)

## 2011-12-26 LAB — PROTIME-INR: INR: 1.34 (ref 0.00–1.49)

## 2011-12-26 MED ORDER — WARFARIN SODIUM 2.5 MG PO TABS
2.5000 mg | ORAL_TABLET | Freq: Once | ORAL | Status: AC
  Administered 2011-12-26: 2.5 mg via ORAL
  Filled 2011-12-26: qty 1

## 2011-12-26 MED ORDER — WARFARIN SODIUM 2.5 MG PO TABS: ORAL_TABLET | ORAL | Status: DC

## 2011-12-26 MED ORDER — METHOCARBAMOL 500 MG PO TABS: 500.0000 mg | ORAL_TABLET | Freq: Three times a day (TID) | ORAL | Status: AC | PRN

## 2011-12-26 MED ORDER — OXYCODONE-ACETAMINOPHEN 5-325 MG PO TABS: 1.0000 | ORAL_TABLET | Freq: Four times a day (QID) | ORAL | Status: AC | PRN

## 2011-12-26 NOTE — Progress Notes (Signed)
ANTICOAGULATION CONSULT NOTE - Follow Up Consult  Pharmacy Consult for warfarin Indication: VTE prophylaxis  No Known Allergies  Patient Measurements: Height: 5\' 5"  (165.1 cm) Weight: 150 lb (68.04 kg) IBW/kg (Calculated) : 57   Vital Signs: Temp: 98.9 F (37.2 C) (08/25 0545) Temp src: Oral (08/25 0545) BP: 133/55 mmHg (08/25 0545) Pulse Rate: 94  (08/25 0545)  Labs:  Basename 12/26/11 0500 12/25/11 0620  HGB 9.7* 9.6*  HCT 28.4* 28.1*  PLT 322 299  APTT -- --  LABPROT 16.8* 15.2  INR 1.34 1.18  HEPARINUNFRC -- --  CREATININE 0.78 0.86  CKTOTAL -- --  CKMB -- --  TROPONINI -- --    Estimated Creatinine Clearance: 51.3 ml/min (by C-G formula based on Cr of 0.78).   Medications:  Prescriptions prior to admission  Medication Sig Dispense Refill  . aspirin EC 325 MG tablet Take 325 mg by mouth daily.      . Cholecalciferol (VITAMIN D3) 2000 UNITS TABS Take 1 tablet by mouth daily.      Marland Kitchen CINNAMON PO Take 2,000 mg by mouth daily.      . hydrochlorothiazide (HYDRODIURIL) 25 MG tablet Take 25 mg by mouth daily.      . LUTEIN PO Take 1 tablet by mouth daily.      . Multiple Vitamin (MULTIVITAMIN WITH MINERALS) TABS Take 1 tablet by mouth daily.      . Omega-3 Fatty Acids (FISH OIL TRIPLE STRENGTH PO) Take 1 capsule by mouth daily.      . pravastatin (PRAVACHOL) 40 MG tablet Take 40 mg by mouth daily.      . QUEtiapine (SEROQUEL) 25 MG tablet Take 25 mg by mouth at bedtime.      . ramipril (ALTACE) 10 MG capsule Take 10 mg by mouth 2 (two) times daily.        Assessment: 78 y/o female on Coumadin for VTE prophylaxis s/p TKR on 8/23.  Coumadin score is 2. INR today remains subtherapeutic at 1.34.  Goal of Therapy:  INR goal 1.5-2.0, per MD Monitor platelets by anticoagulation protocol: Yes   Plan:  1. Coumadin 2.5 mg po x 1 tonight 2. Daily PT/INR    Doris Cheadle, PharmD Clinical Pharmacist Pager: [phone] Phone: [phone] 12/26/2011 9:15 AM

## 2011-12-26 NOTE — Progress Notes (Signed)
Physical Therapy Treatment Patient Details Name: Shelley Black MRN: [id number] DOB: 01/01/1931 Today's Date: 12/26/2011 Time: 4098-1191 PT Time Calculation (min): 40 min  PT Assessment / Plan / Recommendation Comments on Treatment Session  Increased activity tolerance this pm with increased gait distance.     Follow Up Recommendations  Home health PT       Equipment Recommendations  3 in 1 bedside comode       Frequency 7X/week   Plan Discharge plan remains appropriate;Frequency remains appropriate    Precautions / Restrictions Precautions Precautions: Knee Required Braces or Orthoses: Knee Immobilizer - Right Knee Immobilizer - Right: On except when in CPM Restrictions RLE Weight Bearing: Weight bearing as tolerated    Pertinent Vitals/Pain Reports 8-9/10 right knee pain before and after session. RN notified and will give pain meds when due again.    Mobility  Bed Mobility Bed Mobility: Sit to Supine;Scooting to HOB Sit to Supine: 4: Min assist;HOB flat Scooting to HOB: 4: Min assist;With rail Details for Bed Mobility Assistance: assistance to raise right LE up onto bed with lying down. with cues pt able to use bil rails and left LE to push self up in bed toward head of bed. required assistance with right let. Transfers Sit to Stand: 5: Supervision;From toilet;With upper extremity assist Stand to Sit: 5: Supervision;To bed;With upper extremity assist Details for Transfer Assistance: cues for hand placement with both transfers and for right leg placement with sitting down. Ambulation/Gait Ambulation/Gait Assistance: 4: Min guard Ambulation Distance (Feet): 40 Feet Assistive device: Rolling walker Ambulation/Gait Assistance Details: cus for upright posture- look fwd where she was going, for walker postion/placement with gait and to decrease UE reliance with gait. Gait Pattern: Step-to pattern;Decreased step length - left;Decreased stance time - right;Antalgic Gait  velocity: Decreased gait speed.    Exercises Total Joint Exercises Ankle Circles/Pumps: AROM;Both;10 reps;Supine Quad Sets: AROM;Strengthening;Right;10 reps;Supine Heel Slides: AAROM;Strengthening;Right;10 reps;Supine Hip ABduction/ADduction: AAROM;Strengthening;Right;10 reps;Supine Straight Leg Raises: AAROM;Strengthening;Right;10 reps;Supine    PT Goals Acute Rehab PT Goals PT Goal: Sit to Supine/Side - Progress: Progressing toward goal PT Goal: Sit to Stand - Progress: Progressing toward goal PT Goal: Stand to Sit - Progress: Progressing toward goal PT Goal: Ambulate - Progress: Progressing toward goal PT Goal: Perform Home Exercise Program - Progress: Progressing toward goal  Visit Information  Last PT Received On: 12/26/11 Assistance Needed: +1    Subjective Data  Subjective: No new complaints, still with increased right knee pain today vs yesterday.   Cognition  Overall Cognitive Status: Appears within functional limits for tasks assessed/performed Arousal/Alertness: Awake/alert Orientation Level: Appears intact for tasks assessed Behavior During Session: [facility] for tasks performed       End of Session PT - End of Session Equipment Utilized During Treatment: Gait belt;Right knee immobilizer Activity Tolerance: Patient tolerated treatment well;Patient limited by pain Patient left: in bed;with call bell/phone within reach;with family/visitor present Nurse Communication: Patient requests pain meds;Mobility status CPM Right Knee CPM Right Knee: On Right Knee Flexion (Degrees): 60  Right Knee Extension (Degrees): 0  Additional Comments: applied CPM after PT session   GP     Sallyanne Kuster 12/26/2011, 3:21 PM  Sallyanne Kuster, PTA Office- [phone]

## 2011-12-26 NOTE — Progress Notes (Addendum)
Subjective: 2 Days Post-Op Procedure(s) (LRB): TOTAL KNEE ARTHROPLASTY (Right) Patient reports pain as 4 on 0-10 scale.   Slow progress with PT. Taking po/voiding ok.  Objective: Vital signs in last 24 hours: Temp:  [98.4 F (36.9 C)-98.9 F (37.2 C)] 98.9 F (37.2 C) (08/25 0545) Pulse Rate:  [80-96] 94  (08/25 0545) Resp:  [18-20] 20  (08/25 0800) BP: (133-166)/(45-55) 133/55 mmHg (08/25 0545) SpO2:  [91 %-99 %] 99 % (08/25 0800)  Intake/Output from previous day: 08/24 0701 - 08/25 0700 In: 1470 [P.O.:480; I.V.:990] Out: 50 [Drains:50] Intake/Output this shift: Total I/O In: 240 [P.O.:240] Out: -    Basename 12/26/11 0500 12/25/11 0620  HGB 9.7* 9.6*    Basename 12/26/11 0500 12/25/11 0620  WBC 12.7* 8.9  RBC 3.05* 3.01*  HCT 28.4* 28.1*  PLT 322 299    Basename 12/26/11 0500 12/25/11 0620  NA 131* 133*  K 3.8 3.2*  CL 93* 96  CO2 30 26  BUN 11 14  CREATININE 0.78 0.86  GLUCOSE 130* 118*  CALCIUM 8.5 8.4    Basename 12/26/11 0500 12/25/11 0620  LABPT -- --  INR 1.34 1.18  Right knee exam:  Neurovascular intact Sensation intact distally Intact pulses distally Dorsiflexion/Plantar flexion intact Incision: no drainage Compartment soft  Assessment/Plan: 2 Days Post-Op Procedure(s) (LRB): TOTAL KNEE ARTHROPLASTY (Right) Hypokalemia  Resolved. Hyponatremia resolving with plan for observation Plan: Plan for discharge tomorrow Cont PT  BETHUNE,JAMES G 12/26/2011, 11:11 AM

## 2011-12-26 NOTE — Progress Notes (Signed)
Physical Therapy Treatment Patient Details Name: Shelley Black MRN: [id number] DOB: [birth year] Today's Date: 12/26/2011 Time: 7829-5621 PT Time Calculation (min): 25 min  PT Assessment / Plan / Recommendation Comments on Treatment Session  Decreased tolerance to activity today due to increased right knee pain. Pt now planning to go home tomorrow vs today.    Follow Up Recommendations  Home health PT       Equipment Recommendations  3 in 1 bedside comode       Frequency 7X/week   Plan Discharge plan remains appropriate    Precautions / Restrictions Precautions Precautions: Knee Required Braces or Orthoses: Knee Immobilizer - Right Knee Immobilizer - Right: On except when in CPM Restrictions RLE Weight Bearing: Weight bearing as tolerated    Pertinent Vitals/Pain Reports 9-10/10 right knee pain. RN notified and IV pain meds given prior to out of bed.    Mobility  Bed Mobility Supine to Sit: 4: Min assist;HOB flat Details for Bed Mobility Assistance: assist needed with advancing right LE off bed.  Transfers Sit to Stand: 4: Min assist;From bed;From elevated surface;With upper extremity assist Sit to Stand: Patient Percentage: 80% Stand to Sit: 4: Min assist;To chair/3-in-1;With upper extremity assist;With armrests Details for Transfer Assistance: cues for hand placement and assistance to control descent with sitting down into chair. bed elevated up to assit with standing. Ambulation/Gait Ambulation/Gait Assistance: 4: Min guard Ambulation Distance (Feet): 10 Feet Assistive device: Rolling walker Ambulation/Gait Assistance Details: cues for posture, sequence with gait and for walker postion with gait.  Gait Pattern: Step-to pattern;Decreased step length - left;Decreased stance time - right;Antalgic    Exercises Total Joint Exercises Ankle Circles/Pumps: AROM;Both;10 reps;Supine Quad Sets: AROM;Strengthening;Right;10 reps;Supine Heel Slides:  AAROM;Strengthening;Right;10 reps;Supine Hip ABduction/ADduction: AAROM;Strengthening;Right;10 reps;Supine Straight Leg Raises: AAROM;Strengthening;10 reps;Right;Supine    PT Goals Acute Rehab PT Goals PT Goal: Supine/Side to Sit - Progress: Progressing toward goal PT Goal: Sit to Stand - Progress: Progressing toward goal PT Goal: Stand to Sit - Progress: Progressing toward goal PT Goal: Ambulate - Progress: Progressing toward goal PT Goal: Perform Home Exercise Program - Progress: Progressing toward goal  Visit Information  Last PT Received On: 12/26/11 Assistance Needed: +1    Subjective Data  Subjective: "My knee hurts so much more today".   Cognition  Overall Cognitive Status: Appears within functional limits for tasks assessed/performed Arousal/Alertness: Awake/alert Orientation Level: Appears intact for tasks assessed Behavior During Session: [facility] for tasks performed       End of Session PT - End of Session Equipment Utilized During Treatment: Gait belt;Right knee immobilizer Activity Tolerance: Patient tolerated treatment well;Patient limited by pain Patient left: in chair;with call bell/phone within reach;with family/visitor present;with nursing in room Nurse Communication: Mobility status;Patient requests pain meds   GP     Sallyanne Kuster 12/26/2011, 12:54 PM  Sallyanne Kuster, PTA Office- [phone]

## 2011-12-27 DIAGNOSIS — E876 Hypokalemia: Secondary | ICD-10-CM | POA: Diagnosis not present

## 2011-12-27 DIAGNOSIS — E871 Hypo-osmolality and hyponatremia: Secondary | ICD-10-CM | POA: Diagnosis not present

## 2011-12-27 LAB — BASIC METABOLIC PANEL
CO2: 29 mEq/L (ref 19–32)
Calcium: 8.6 mg/dL (ref 8.4–10.5)
Chloride: 93 mEq/L — ABNORMAL LOW (ref 96–112)
Potassium: 4 mEq/L (ref 3.5–5.1)
Sodium: 129 mEq/L — ABNORMAL LOW (ref 135–145)

## 2011-12-27 LAB — PROTIME-INR
INR: 1.47 (ref 0.00–1.49)
Prothrombin Time: 18.1 seconds — ABNORMAL HIGH (ref 11.6–15.2)

## 2011-12-27 LAB — CBC
Platelets: 302 10*3/uL (ref 150–400)
RBC: 2.78 MIL/uL — ABNORMAL LOW (ref 3.87–5.11)
WBC: 9.3 10*3/uL (ref 4.0–10.5)

## 2011-12-27 MED ORDER — METHOCARBAMOL 500 MG PO TABS: 500.0000 mg | ORAL_TABLET | Freq: Four times a day (QID) | ORAL | Status: AC

## 2011-12-27 MED ORDER — WARFARIN SODIUM 2.5 MG PO TABS: 2.5000 mg | ORAL_TABLET | Freq: Every day | ORAL | Status: DC

## 2011-12-27 MED ORDER — WARFARIN SODIUM 2.5 MG PO TABS
2.5000 mg | ORAL_TABLET | Freq: Once | ORAL | Status: AC
  Administered 2011-12-27: 2.5 mg via ORAL
  Filled 2011-12-27 (×2): qty 1

## 2011-12-27 MED ORDER — OXYCODONE-ACETAMINOPHEN 5-325 MG PO TABS: 1.0000 | ORAL_TABLET | ORAL | Status: AC | PRN

## 2011-12-27 NOTE — Progress Notes (Signed)
CARE MANAGEMENT NOTE 12/27/2011  Patient:  Shelley Black, Shelley Black   Account Number:  [id number]  Date Initiated:  12/24/2011  Documentation initiated by:  Anette Guarneri  Subjective/Objective Assessment:   Right TKA 12/24/11 will need HH services, DME    Action/Plan:   home with [facility] services CM spoke with patient's husband. Choice offered. They have rolling walker and getting 3in1 from [employer]. CPM to be delivered to the home.  Anticipated DC Date:  12/28/2011   Anticipated DC Plan:  HOME W HOME HEALTH SERVICES      DC Planning Services CM consult     [facility] Choice HOME HEALTH  Choice offered to / List presented to:  C-3 Spouse        HH arranged HH-2 PT HH-1 RN     [facility] agency [facility]  Status of service:  Completed, signed off Medicare Important Message given?   (If response is "NO", the following Medicare IM given date fields will be blank) Date Medicare IM given:   Date Additional Medicare IM given:    Discharge Disposition:  HOME W HOME HEALTH SERVICES  Per UR Regulation:  Reviewed for med. necessity/level of care/duration of stay  If discussed at Long Length of Stay Meetings, dates discussed:

## 2011-12-27 NOTE — Progress Notes (Signed)
PHYSICAL THERAPY PROGRESS NOTE ADDENDUM:    Pt struggled with stairs and will need to negotiate a full flight of stairs at home.  Pt mostly limited by pain in right knee.  Pt would benefit from another day of therapy in acute setting to prepare pt for safe return to home.   Zahirah Cheslock L. Lajoya Dombek DPT [phone].

## 2011-12-27 NOTE — Progress Notes (Addendum)
Subjective: 3 Days Post-Op Procedure(s) (LRB): TOTAL KNEE ARTHROPLASTY (Right) Patient reports pain as moderate.   C/o pain with activity  Objective: Vital signs in last 24 hours: Temp:  [98.6 F (37 C)-101 F (38.3 C)] 100.1 F (37.8 C) (08/26 0514) Pulse Rate:  [92-100] 92  (08/26 0514) Resp:  [18-20] 18  (08/26 0514) BP: (134-148)/(45-54) 134/45 mmHg (08/26 0514) SpO2:  [90 %-99 %] 91 % (08/26 0514)  Intake/Output from previous day: 08/25 0701 - 08/26 0700 In: 960 [P.O.:960] Out: -  Intake/Output this shift:     Basename 12/27/11 0600 12/26/11 0500 12/25/11 0620  HGB 9.0* 9.7* 9.6*    Basename 12/27/11 0600 12/26/11 0500  WBC 9.3 12.7*  RBC 2.78* 3.05*  HCT 26.2* 28.4*  PLT 302 322    Basename 12/27/11 0600 12/26/11 0500  NA 129* 131*  K 4.0 3.8  CL 93* 93*  CO2 29 30  BUN 13 11  CREATININE 0.72 0.78  GLUCOSE 117* 130*  CALCIUM 8.6 8.5    Basename 12/27/11 0600 12/26/11 0500  LABPT -- --  INR 1.47 1.34    Neurologically intact ABD soft Neurovascular intact Sensation intact distally Intact pulses distally Dorsiflexion/Plantar flexion intact No cellulitis present Compartment soft  Assessment/Plan: Hyponatremia with NA-129  Will observe for now 3 Days Post-Op Procedure(s) (LRB): TOTAL KNEE ARTHROPLASTY (Right) Advance diet Up with therapy Discharge home with home health if she passes PT today   Tahisha Hakim L 12/27/2011, 7:46 AM

## 2011-12-27 NOTE — Progress Notes (Signed)
ANTICOAGULATION CONSULT NOTE - Follow Up Consult  Pharmacy Consult for warfarin Indication: VTE prophylaxis  No Known Allergies  Patient Measurements: Height: 5\' 5"  (165.1 cm) Weight: 150 lb (68.04 kg) IBW/kg (Calculated) : 57   Vital Signs: Temp: 100.1 F (37.8 C) (08/26 0514) Temp src: Oral (08/26 0514) BP: 134/45 mmHg (08/26 0514) Pulse Rate: 92  (08/26 0514)  Labs:  Basename 12/27/11 0600 12/26/11 0500 12/25/11 0620  HGB 9.0* 9.7* --  HCT 26.2* 28.4* 28.1*  PLT 302 322 299  APTT -- -- --  LABPROT 18.1* 16.8* 15.2  INR 1.47 1.34 1.18  HEPARINUNFRC -- -- --  CREATININE 0.72 0.78 0.86  CKTOTAL -- -- --  CKMB -- -- --  TROPONINI -- -- --    Estimated Creatinine Clearance: 51.3 ml/min (by C-G formula based on Cr of 0.72).    Assessment: 78 y/o female on Coumadin for VTE prophylaxis s/p TKR on 8/23.  Coumadin score is 2. INR = 1.47 after 3 doses of couamdin 2.5 mg.  No bleeding reported.  Goal of Therapy:  INR goal 1.5-2.0, per MD   Plan:  1. Coumadin 2.5 mg po daily for DC dose 2. Daily INR Herby Abraham, Pharm.D. [phone] 12/27/2011 10:13 AM

## 2011-12-27 NOTE — Clinical Documentation Improvement (Signed)
Abnormal Labs Clarification  THIS DOCUMENT IS NOT A PERMANENT PART OF THE MEDICAL RECORD  TO RESPOND TO THE THIS QUERY, FOLLOW THE INSTRUCTIONS BELOW:  1. If needed, update documentation for the patient's encounter via the notes activity.  2. Access this query again and click edit on the [employer].  3. After updating, or not, click F2 to complete all highlighted (required) fields concerning your review. Select "additional documentation in the medical record" OR "no additional documentation provided".  4. Click Sign note button.  5. The deficiency will fall out of your InBasket *Please let us know if you are not able to complete this workflow by phone or e-mail (listed below).  Please update your documentation within the medical record to reflect your response to this query.                                                                                   12/27/11  Dear Matthew Folks, PA/Associates  In a better effort to capture your patient's severity of illness, reflect appropriate length of stay and utilization of resources, a review of the medical record has revealed the following indicators.    Based on your clinical judgment, please clarify and document in a progress note and/or discharge summary the clinical condition associated with the following supporting information:  In responding to this query please exercise your independent judgment.  The fact that a query is asked, does not imply that any particular answer is desired or expected.  Abnormal findings (laboratory, x-ray, pathologic, and other diagnostic results) are not coded and reported unless the physician indicates their clinical significance.   The medical record reflects the following clinical findings, please clarify the diagnostic and/or clinical significance:      Noted NA level during post op stay went from 139 ( 8/19) to 129 (8/26) patient continued on IV fluids during stay, please provide a secondary  diagnosis if appropriate.  Thank you   Possible Clinical Conditions?                              Hyponatremia  Hyposmolality  Other Condition                Cannot Clinically Determine  Treatment: D 5 1/2 NS @ 75 ml /hr then down to 20 ml /hr on 8/26  Reviewed: doc on 8/25 prog nt   Thank Bonita Quin,  Leonette Most Tajuana Kniskern  Clinical Documentation Specialist RN, BSN:  Pager [phone] HIM off # [phone]  Health Information Management [place]

## 2011-12-27 NOTE — Progress Notes (Signed)
PHYSICAL THERAPY PROGRESS NOTE.    12/27/11 1200  PT Visit Information  Last PT Received On 12/27/11  Assistance Needed +1  PT Time Calculation  PT Start Time 1203  PT Stop Time 1233  PT Time Calculation (min) 30 min  Subjective Data  Subjective No new complaints, still with increased right knee pain today vs yesterday.  Precautions  Precautions Knee  Required Braces or Orthoses Knee Immobilizer - Right  Knee Immobilizer - Right On except when in CPM  Restrictions  Weight Bearing Restrictions Yes  RLE Weight Bearing WBAT  Cognition  Overall Cognitive Status Appears within functional limits for tasks assessed/performed  Arousal/Alertness Awake/alert  Orientation Level Appears intact for tasks assessed  Behavior During Session [facility] for tasks performed  Bed Mobility  Bed Mobility Sit to Supine;Scooting to HOB  Supine to Sit 4: Min assist;HOB flat  Sit to Supine 4: Min assist;HOB flat  Details for Bed Mobility Assistance Assist for R LE secondary to pain and weakness pt instructed to use leg lifter.    Transfers  Transfers Sit to Stand;Stand to Sit  Sit to Stand With upper extremity assist;4: Min assist;From bed;From chair/3-in-1  Sit to Stand: Patient Percentage 80%  Stand to Sit 5: Supervision;To bed;With upper extremity assist;To chair/3-in-1  Details for Transfer Assistance Cues for hand placement.  Assist for R LE placement.   Ambulation/Gait  Ambulation/Gait Assistance 5: Supervision  Ambulation Distance (Feet) 80 Feet  Assistive device Rolling walker  Ambulation/Gait Assistance Details Cues for increased gait speed  Gait Pattern Step-to pattern;Decreased step length - left;Decreased stance time - right;Antalgic  Gait velocity Decreased gait speed.  Stairs No  Wheelchair Mobility  Wheelchair Mobility No  Total Joint Exercises  Ankle Circles/Pumps AROM;Both;10 reps;Supine  Quad Sets AROM;Strengthening;Right;10 reps;Supine  PT - End of Session  Equipment Utilized  During Treatment Gait belt;Right knee immobilizer  Activity Tolerance Patient tolerated treatment well;Patient limited by pain  Patient left in bed;with call bell/phone within reach;with family/visitor present  Nurse Communication Patient requests pain meds;Mobility status  PT - Assessment/Plan  Comments on Treatment Session Pt is mostly limited by pain. Will assess later today to see if pt is appropriate for d/c home.  PT Plan Discharge plan remains appropriate;Frequency remains appropriate  PT Frequency 7X/week  Follow Up Recommendations Home health PT  Equipment Recommended 3 in 1 bedside comode  Acute Rehab PT Goals  PT Goal Formulation With patient  Time For Goal Achievement 01/01/12  Potential to Achieve Goals Good  Pt will go Supine/Side to Sit with modified independence;with rail;with HOB 0 degrees  PT Goal: Supine/Side to Sit - Progress Progressing toward goal  Pt will go Sit to Supine/Side with modified independence;with rail;with HOB 0 degrees  PT Goal: Sit to Supine/Side - Progress Progressing toward goal  Pt will go Sit to Stand with modified independence;with upper extremity assist  PT Goal: Sit to Stand - Progress Progressing toward goal  Pt will go Stand to Sit with modified independence;with upper extremity assist  PT Goal: Stand to Sit - Progress Progressing toward goal  Pt will Ambulate 51 - 150 feet;with modified independence;with rolling walker  PT Goal: Ambulate - Progress Progressing toward goal  Pt will Go Up / Down Stairs Flight;with min assist;with rail(s);with least restrictive assistive device  PT Goal: Up/Down Stairs - Progress Progressing toward goal  Pt will Perform Home Exercise Program Independently  PT Goal: Perform Home Exercise Program - Progress Met  PT General Charges  $$ ACUTE PT VISIT  1 Procedure  PT Treatments  $Gait Training 8-22 mins  $Therapeutic Activity 8-22 mins   Canton Yearby L. Nyxon Strupp DPT [phone]

## 2011-12-27 NOTE — Progress Notes (Deleted)
CARE MANAGEMENT NOTE 12/27/2011  Patient:  Shelley Black,Shelley Black   Account Number:  [id number]  Date Initiated:  12/27/2011  Documentation initiated by:  Brysen Shankman  Subjective/Objective Assessment:   78 yr old female s/p right hip IM nail     Action/Plan:   Patient is for shortterm rehab. Social Worker is aware.   Anticipated DC Date:  12/29/2011   Anticipated DC Plan:  SKILLED NURSING FACILITY  In-house referral  Clinical Social Worker      DC Planning Services  CM consult      Choice offered to / List presented to:             Status of service:  Completed, signed off Medicare Important Message given?   (If response is "NO", the following Medicare IM given date fields will be blank) Date Medicare IM given:   Date Additional Medicare IM given:    Discharge Disposition:  SKILLED NURSING FACILITY  Per UR Regulation:    If discussed at Long Length of Stay Meetings, dates discussed:    Comments:     

## 2011-12-27 NOTE — Progress Notes (Signed)
Physical Therapy Treatment Patient Details Name: Shelley Black MRN: [id number] DOB: [birth year] Today's Date: 12/27/2011 Time: 1478-2956 PT Time Calculation (min): 43 min  PT Assessment / Plan / Recommendation Comments on Treatment Session  See contact not in chart.      Follow Up Recommendations  Home health PT    Barriers to Discharge        Equipment Recommendations  3 in 1 bedside comode    Recommendations for Other Services    Frequency 7X/week   Plan Discharge plan remains appropriate;Frequency remains appropriate    Precautions / Restrictions Precautions Precautions: Knee Precaution Booklet Issued: Yes (comment) Required Braces or Orthoses: Knee Immobilizer - Right Knee Immobilizer - Right: On except when in CPM Restrictions Weight Bearing Restrictions: Yes RLE Weight Bearing: Weight bearing as tolerated   Pertinent Vitals/Pain Pt c/o pain throughout session 10/10 RN notified but unable to give IV pain meds due to clotted catheter.     Mobility  Bed Mobility Bed Mobility: Sit to Supine;Scooting to HOB Supine to Sit: 4: Min assist;HOB flat Sit to Supine: 4: Min assist;HOB flat Scooting to HOB: 4: Min assist;With rail Details for Bed Mobility Assistance: Assist for R LE secondary to pain and weakness pt instructed to use leg lifter.   Transfers Transfers: Sit to Stand;Stand to Sit Sit to Stand: 5: Supervision;With upper extremity assist;Without upper extremity assist Sit to Stand: Patient Percentage: 80% Stand to Sit: 5: Supervision;With upper extremity assist;With armrests Details for Transfer Assistance: Cues to positon R LE.  Ambulation/Gait Ambulation/Gait Assistance: 5: Supervision Ambulation/Gait: Patient Percentage: 80% Ambulation Distance (Feet): 80 Feet Assistive device: Rolling walker Gait Pattern: Step-to pattern;Decreased step length - left;Decreased stance time - right;Antalgic Gait velocity: Decreased gait speed. Stairs: Yes Stairs  Assistance: 4: Min assist Stairs Assistance Details (indicate cue type and reason): Assist  to manage walker, cues for technique.  Stair Management Technique: One rail Left;Forwards Number of Stairs: 5  Wheelchair Mobility Wheelchair Mobility: No    Exercises     PT Diagnosis:    PT Problem List:   PT Treatment Interventions:     PT Goals Acute Rehab PT Goals PT Goal Formulation: With patient Time For Goal Achievement: 01/01/12 Potential to Achieve Goals: Good Pt will go Supine/Side to Sit: with modified independence;with rail;with HOB 0 degrees PT Goal: Supine/Side to Sit - Progress: Progressing toward goal Pt will go Sit to Supine/Side: with modified independence;with rail;with HOB 0 degrees PT Goal: Sit to Supine/Side - Progress: Progressing toward goal Pt will go Sit to Stand: with modified independence;with upper extremity assist PT Goal: Sit to Stand - Progress: Progressing toward goal Pt will go Stand to Sit: with modified independence;with upper extremity assist PT Goal: Stand to Sit - Progress: Progressing toward goal Pt will Ambulate: 51 - 150 feet;with modified independence;with rolling walker PT Goal: Ambulate - Progress: Goal set today Pt will Go Up / Down Stairs: Flight;with min assist;with rail(s);with least restrictive assistive device PT Goal: Up/Down Stairs - Progress: Progressing toward goal  Visit Information  Last PT Received On: 12/27/11 Assistance Needed: +1    Subjective Data  Patient Stated Goal: I am not ready to go home    Cognition  Overall Cognitive Status: Appears within functional limits for tasks assessed/performed Arousal/Alertness: Awake/alert Orientation Level: Appears intact for tasks assessed Behavior During Session: [facility] for tasks performed    Balance     End of Session PT - End of Session Equipment Utilized During Treatment: Gait belt;Right knee immobilizer Activity  Tolerance: Patient tolerated treatment well;Patient limited by  pain Patient left: in bed;with call bell/phone within reach;with family/visitor present Nurse Communication: Patient requests pain meds;Mobility status   GP     Kailene Steinhart 12/27/2011, 4:48 PM Linsie Lupo L. Tamasha Laplante DPT [phone]

## 2011-12-28 ENCOUNTER — Encounter (HOSPITAL_COMMUNITY): Payer: Self-pay | Admitting: Orthopedic Surgery

## 2011-12-28 LAB — PROTIME-INR
INR: 1.44 (ref 0.00–1.49)
Prothrombin Time: 17.8 seconds — ABNORMAL HIGH (ref 11.6–15.2)

## 2011-12-28 MED ORDER — WARFARIN SODIUM 2.5 MG PO TABS
2.5000 mg | ORAL_TABLET | Freq: Every day | ORAL | Status: DC
  Filled 2011-12-28: qty 1

## 2011-12-28 NOTE — Progress Notes (Signed)
Subjective: 4 Days Post-Op Procedure(s) (LRB): TOTAL KNEE ARTHROPLASTY (Right) Patient reports pain as moderate.   Pt not able to tolerate stairs yesterday and felt to be unsafe for discharge  Objective: Vital signs in last 24 hours: Temp:  [98.7 F (37.1 C)-99.2 F (37.3 C)] 99.2 F (37.3 C) (08/27 0652) Pulse Rate:  [87-92] 87  (08/27 0652) Resp:  [16-20] 20  (08/27 0652) BP: (136-170)/(53-56) 170/53 mmHg (08/27 0652) SpO2:  [92 %-97 %] 96 % (08/27 0652)  Intake/Output from previous day: 08/26 0701 - 08/27 0700 In: 1080 [P.O.:1080] Out: -  Intake/Output this shift:     Basename 12/27/11 0600 12/26/11 0500  HGB 9.0* 9.7*    Basename 12/27/11 0600 12/26/11 0500  WBC 9.3 12.7*  RBC 2.78* 3.05*  HCT 26.2* 28.4*  PLT 302 322    Basename 12/27/11 0600 12/26/11 0500  NA 129* 131*  K 4.0 3.8  CL 93* 93*  CO2 29 30  BUN 13 11  CREATININE 0.72 0.78  GLUCOSE 117* 130*  CALCIUM 8.6 8.5    Basename 12/28/11 0602 12/27/11 0600  LABPT -- --  INR 1.44 1.47    Neurologically intact ABD soft Neurovascular intact Sensation intact distally Intact pulses distally Dorsiflexion/Plantar flexion intact No cellulitis present Compartment soft  Assessment/Plan: 4 Days Post-Op Procedure(s) (LRB): TOTAL KNEE ARTHROPLASTY (Right) Hypokalemia and hyponatremia resolving Advance diet Up with therapy Discharge home with home health  Shelley Black L 12/28/2011, 7:56 AM

## 2011-12-28 NOTE — Progress Notes (Signed)
ANTICOAGULATION CONSULT NOTE - Follow Up Consult  Pharmacy Consult for warfarin Indication: VTE prophylaxis  No Known Allergies  Patient Measurements: Height: 5\' 5"  (165.1 cm) Weight: 150 lb (68.04 kg) IBW/kg (Calculated) : 57   Vital Signs: Temp: 99.2 F (37.3 C) (08/27 0652) Temp src: Oral (08/27 0652) BP: 170/53 mmHg (08/27 0652) Pulse Rate: 87  (08/27 0652)  Labs:  Basename 12/28/11 0602 12/27/11 0600 12/26/11 0500  HGB -- 9.0* 9.7*  HCT -- 26.2* 28.4*  PLT -- 302 322  APTT -- -- --  LABPROT 17.8* 18.1* 16.8*  INR 1.44 1.47 1.34  HEPARINUNFRC -- -- --  CREATININE -- 0.72 0.78  CKTOTAL -- -- --  CKMB -- -- --  TROPONINI -- -- --    Estimated Creatinine Clearance: 51.3 ml/min (by C-G formula based on Cr of 0.72).    Assessment: 78 y/o female on Coumadin for VTE prophylaxis s/p TKR on 8/23.  Coumadin score is 2. INR = 1.44 after 4 doses of couamdin 2.5 mg.  No bleeding reported.  MD has written DC home orders and has written prescription for coumadin 2.5 mg daily with INR goal 1.5 - 2.  Goal of Therapy:  INR goal 1.5-2.0, per MD   Plan:  1. Coumadin 2.5 mg po daily for DC dose  Herby Abraham, Pharm.D. [phone] 12/28/2011 9:42 AM

## 2011-12-28 NOTE — Progress Notes (Addendum)
Physical Therapy Treatment Patient Details Name: Shelley Black MRN: [id number] DOB: 1931-01-22 Today's Date: 12/28/2011 Time: 4098-1191 PT Time Calculation (min): 41 min  PT Assessment / Plan / Recommendation Comments on Treatment Session  Pt progressing well with ambulation, however with slow gait speed.  Practiced step to get into house with RW, did well.  Pt states that she is going to be able to get daughter to bring bed for first level in order to avoid going up/down full flight of stairs initially.      Follow Up Recommendations  Home health PT    Barriers to Discharge        Equipment Recommendations  3 in 1 bedside comode    Recommendations for Other Services    Frequency 7X/week   Plan Discharge plan remains appropriate;Frequency remains appropriate    Precautions / Restrictions Precautions Precautions: Knee Precaution Booklet Issued: Yes (comment) Required Braces or Orthoses: Knee Immobilizer - Right Knee Immobilizer - Right: On except when in CPM Restrictions Weight Bearing Restrictions: Yes RLE Weight Bearing: Weight bearing as tolerated   Pertinent Vitals/Pain 5/10, premedicated, ice pack applied    Mobility  Bed Mobility Bed Mobility: Supine to Sit Supine to Sit: 4: Min assist;HOB flat Details for Bed Mobility Assistance: Some assist for RLE out of bed, however pt doing well assisting as well with min cues for technqiue.  Transfers Transfers: Sit to Stand;Stand to Sit Sit to Stand: 5: Supervision;With upper extremity assist;With armrests;From bed;From chair/3-in-1 Stand to Sit: 5: Supervision;With upper extremity assist;With armrests Details for Transfer Assistance: Performed x 3 ino order to rest and perform stair training.  Cues for hand placement and LE management.  Ambulation/Gait Ambulation/Gait Assistance: 5: Supervision Ambulation Distance (Feet): 60 Feet Assistive device: Rolling walker Ambulation/Gait Assistance Details: Min cues for posture  initially Gait Pattern: Step-to pattern;Decreased step length - left;Decreased stance time - right;Antalgic Gait velocity: Decreased gait speed. Stairs: Yes Stairs Assistance: 4: Min assist Stairs Assistance Details (indicate cue type and reason): Cues for sequencing/technique with RW Stair Management Technique: No rails;Backwards;With walker;Forwards Number of Stairs: 1  (states step to get in house, no handrails) Wheelchair Mobility Wheelchair Mobility: No    Exercises     PT Diagnosis:    PT Problem List:   PT Treatment Interventions:     PT Goals Acute Rehab PT Goals PT Goal Formulation: With patient Time For Goal Achievement: 01/01/12 Potential to Achieve Goals: Good Pt will go Supine/Side to Sit: with modified independence;with rail;with HOB 0 degrees PT Goal: Supine/Side to Sit - Progress: Progressing toward goal Pt will go Sit to Stand: with modified independence;with upper extremity assist PT Goal: Sit to Stand - Progress: Progressing toward goal Pt will go Stand to Sit: with modified independence;with upper extremity assist PT Goal: Stand to Sit - Progress: Progressing toward goal Pt will Ambulate: 51 - 150 feet;with modified independence;with rolling walker PT Goal: Ambulate - Progress: Progressing toward goal Pt will Go Up / Down Stairs: Flight;with min assist;with rail(s);with least restrictive assistive device PT Goal: Up/Down Stairs - Progress: Progressing toward goal  Visit Information  Last PT Received On: 12/28/11 Assistance Needed: +1    Subjective Data  Subjective: My leg just hurts all the time Patient Stated Goal: I think I'm ready to go home.    Cognition  Overall Cognitive Status: Appears within functional limits for tasks assessed/performed Arousal/Alertness: Awake/alert Orientation Level: Appears intact for tasks assessed Behavior During Session: [facility] for tasks performed    Balance  End of Session PT - End of Session Equipment Utilized  During Treatment: Gait belt;Right knee immobilizer Activity Tolerance: Patient tolerated treatment well;Patient limited by pain Patient left: in chair;with call bell/phone within reach   GP     Page, Meribeth Mattes 12/28/2011, 10:49 AM

## 2011-12-29 DIAGNOSIS — Z7901 Long term (current) use of anticoagulants: Secondary | ICD-10-CM | POA: Diagnosis not present

## 2011-12-29 DIAGNOSIS — I10 Essential (primary) hypertension: Secondary | ICD-10-CM | POA: Diagnosis not present

## 2011-12-29 DIAGNOSIS — Z96659 Presence of unspecified artificial knee joint: Secondary | ICD-10-CM | POA: Diagnosis not present

## 2011-12-29 DIAGNOSIS — Z471 Aftercare following joint replacement surgery: Secondary | ICD-10-CM | POA: Diagnosis not present

## 2011-12-29 DIAGNOSIS — M1710 Unilateral primary osteoarthritis, unspecified knee: Secondary | ICD-10-CM | POA: Diagnosis not present

## 2011-12-29 DIAGNOSIS — Z5181 Encounter for therapeutic drug level monitoring: Secondary | ICD-10-CM | POA: Diagnosis not present

## 2011-12-29 DIAGNOSIS — M159 Polyosteoarthritis, unspecified: Secondary | ICD-10-CM | POA: Diagnosis not present

## 2011-12-30 DIAGNOSIS — M159 Polyosteoarthritis, unspecified: Secondary | ICD-10-CM | POA: Diagnosis not present

## 2011-12-30 DIAGNOSIS — I10 Essential (primary) hypertension: Secondary | ICD-10-CM | POA: Diagnosis not present

## 2011-12-30 DIAGNOSIS — Z96659 Presence of unspecified artificial knee joint: Secondary | ICD-10-CM | POA: Diagnosis not present

## 2011-12-30 DIAGNOSIS — Z471 Aftercare following joint replacement surgery: Secondary | ICD-10-CM | POA: Diagnosis not present

## 2011-12-30 DIAGNOSIS — Z5181 Encounter for therapeutic drug level monitoring: Secondary | ICD-10-CM | POA: Diagnosis not present

## 2011-12-30 DIAGNOSIS — Z7901 Long term (current) use of anticoagulants: Secondary | ICD-10-CM | POA: Diagnosis not present

## 2011-12-30 NOTE — Progress Notes (Signed)
Utilization review completed. Jonnell Hentges, RN, BSN. 

## 2011-12-31 DIAGNOSIS — Z7901 Long term (current) use of anticoagulants: Secondary | ICD-10-CM | POA: Diagnosis not present

## 2011-12-31 DIAGNOSIS — Z5181 Encounter for therapeutic drug level monitoring: Secondary | ICD-10-CM | POA: Diagnosis not present

## 2011-12-31 DIAGNOSIS — I10 Essential (primary) hypertension: Secondary | ICD-10-CM | POA: Diagnosis not present

## 2011-12-31 DIAGNOSIS — Z96659 Presence of unspecified artificial knee joint: Secondary | ICD-10-CM | POA: Diagnosis not present

## 2011-12-31 DIAGNOSIS — Z471 Aftercare following joint replacement surgery: Secondary | ICD-10-CM | POA: Diagnosis not present

## 2011-12-31 DIAGNOSIS — M159 Polyosteoarthritis, unspecified: Secondary | ICD-10-CM | POA: Diagnosis not present

## 2012-01-03 DIAGNOSIS — Z471 Aftercare following joint replacement surgery: Secondary | ICD-10-CM | POA: Diagnosis not present

## 2012-01-03 DIAGNOSIS — I10 Essential (primary) hypertension: Secondary | ICD-10-CM | POA: Diagnosis not present

## 2012-01-03 DIAGNOSIS — Z5181 Encounter for therapeutic drug level monitoring: Secondary | ICD-10-CM | POA: Diagnosis not present

## 2012-01-03 DIAGNOSIS — Z96659 Presence of unspecified artificial knee joint: Secondary | ICD-10-CM | POA: Diagnosis not present

## 2012-01-03 DIAGNOSIS — M159 Polyosteoarthritis, unspecified: Secondary | ICD-10-CM | POA: Diagnosis not present

## 2012-01-03 DIAGNOSIS — Z7901 Long term (current) use of anticoagulants: Secondary | ICD-10-CM | POA: Diagnosis not present

## 2012-01-04 ENCOUNTER — Other Ambulatory Visit: Payer: Self-pay | Admitting: Family Medicine

## 2012-01-04 DIAGNOSIS — Z96659 Presence of unspecified artificial knee joint: Secondary | ICD-10-CM | POA: Diagnosis not present

## 2012-01-04 DIAGNOSIS — M159 Polyosteoarthritis, unspecified: Secondary | ICD-10-CM | POA: Diagnosis not present

## 2012-01-04 DIAGNOSIS — Z7901 Long term (current) use of anticoagulants: Secondary | ICD-10-CM | POA: Diagnosis not present

## 2012-01-04 DIAGNOSIS — I10 Essential (primary) hypertension: Secondary | ICD-10-CM | POA: Diagnosis not present

## 2012-01-04 DIAGNOSIS — Z471 Aftercare following joint replacement surgery: Secondary | ICD-10-CM | POA: Diagnosis not present

## 2012-01-04 DIAGNOSIS — Z5181 Encounter for therapeutic drug level monitoring: Secondary | ICD-10-CM | POA: Diagnosis not present

## 2012-01-04 MED ORDER — HYDROCODONE-ACETAMINOPHEN 5-325 MG PO TABS
2.0000 | ORAL_TABLET | Freq: Three times a day (TID) | ORAL | Status: AC | PRN
Start: 2012-01-04 — End: 2012-01-14

## 2012-01-04 NOTE — Discharge Summary (Signed)
Patient ID: Shelley Black MRN: [id number] DOB/AGE: Jun 09, 1930 78 y.o.  Admit date: 12/24/2011 Discharge date: 12/28/11 Admission Diagnoses:  Principal Problem:  *Osteoarthritis of right knee Active Problems:  Hypokalemia  Hyponatremia   Discharge Diagnoses:  Same  Past Medical History  Diagnosis Date  . Heart murmur     not seen by cardiologist  . Hypertension   . Arthritis     Surgeries: Procedure(s):right TOTAL KNEE ARTHROPLASTY on 12/24/2011   Consultants:  none  Discharged Condition: Improved  Hospital Course: Shelley Black is an 78 y.o. female who was admitted 12/24/2011 for operative treatment ofOsteoarthritis of right knee. Patient has severe unremitting pain that affects sleep, daily activities, and work/hobbies. After pre-op clearance the patient was taken to the operating room on 12/24/2011 and underwent  Procedure(s):right TOTAL KNEE ARTHROPLASTY.    Patient was given perioperative antibiotics:  Anti-infectives     Start     Dose/Rate Route Frequency Ordered Stop   12/24/11 1346   ceFAZolin (ANCEF) IVPB 2 g/50 mL premix        2 g 100 mL/hr over 30 Minutes Intravenous Every 6 hours 12/24/11 1120 12/24/11 2213   12/24/11 0829   cefUROXime (ZINACEF) injection  Status:  Discontinued          As needed 12/24/11 0829 12/24/11 1011   12/23/11 1521   ceFAZolin (ANCEF) IVPB 2 g/50 mL premix        2 g 100 mL/hr over 30 Minutes Intravenous 60 min pre-op 12/23/11 1521 12/24/11 0746           Patient was given sequential compression devices, early ambulation, and chemoprophylaxis to prevent DVT.  Patient benefited maximally from hospital stay and there were no complications. She was noted to have hyponatremia and hypokalemia, which was treated with potassium supplementation orally and IV fluids.  She made excellent progress with physical therapy.  On postoperative day #3, she was unable to tolerate stairs and was felt to be unsafe for discharge.  Recent  vital signs: see chart   Recent laboratory studies: see chart her potassium on 12/25/11 was 3.2and after oral potassium was given, brought it up to 4.0.  Her sodium was 131 and this was followed and IV fluids were given.  Discharge Medications:   Medication List  As of 01/04/2012  2:50 PM   STOP taking these medications         aspirin EC 325 MG tablet         TAKE these medications         CINNAMON PO   Take 2,000 mg by mouth daily.      FISH OIL TRIPLE STRENGTH PO   Take 1 capsule by mouth daily.      hydrochlorothiazide 25 MG tablet   Commonly known as: HYDRODIURIL   Take 25 mg by mouth daily.      LUTEIN PO   Take 1 tablet by mouth daily.      methocarbamol 500 MG tablet   Commonly known as: ROBAXIN   Take 1 tablet (500 mg total) by mouth every 8 (eight) hours as needed (spasm.).      methocarbamol 500 MG tablet   Commonly known as: ROBAXIN   Take 1 tablet (500 mg total) by mouth 4 (four) times daily.      multivitamin with minerals Tabs   Take 1 tablet by mouth daily.      oxyCODONE-acetaminophen 5-325 MG per tablet   Commonly known as: PERCOCET/ROXICET   Take 1-2  tablets by mouth every 6 (six) hours as needed for pain.      oxyCODONE-acetaminophen 5-325 MG per tablet   Commonly known as: PERCOCET/ROXICET   Take 1-2 tablets by mouth every 4 (four) hours as needed for pain.      pravastatin 40 MG tablet   Commonly known as: PRAVACHOL   Take 40 mg by mouth daily.      QUEtiapine 25 MG tablet   Commonly known as: SEROQUEL   Take 25 mg by mouth at bedtime.      ramipril 10 MG capsule   Commonly known as: ALTACE   Take 10 mg by mouth 2 (two) times daily.      Vitamin D3 2000 UNITS Tabs   Take 1 tablet by mouth daily.      warfarin 2.5 MG tablet   Commonly known as: COUMADIN   One daily unless otherwise directed. Shoot for INR of 1.5-2.0. Treat x 1 month.      warfarin 2.5 MG tablet   Commonly known as: COUMADIN   Take 1 tablet (2.5 mg total) by mouth  daily. Adjust dose per pharmacy            Diagnostic Studies: Dg Chest 2 View  12/20/2011  *RADIOLOGY REPORT*  Clinical Data: Preoperative respiratory evaluation prior to right total knee arthroplasty.  History of hypertension and heart murmur.  CHEST - 2 VIEW  Comparison: Two-view chest x-ray 12/23/2009.  Findings: Cardiac silhouette normal in size, unchanged.  Thoracic aorta atherosclerotic, unchanged.  Hilar and mediastinal contours otherwise unremarkable.  Emphysematous changes in the upper lobes and mild hyperinflation, unchanged.  Lungs clear.  No pleural effusions.  Degenerative changes involve the thoracic spine, probable osteopenia, and lower thoracic scoliosis convex left with compensatory lumbar scoliosis convex right.  IMPRESSION: COPD/emphysema.  No acute cardiopulmonary disease.  Stable examination.   Original Report Authenticated By: Arnell Sieving, M.D. ( 12/20/2011 11:30:35 )     Disposition: 06-Home-Health Care Svc  Discharge Orders    Future Orders Please Complete By Expires   Diet general      Call MD / Call 911      Comments:   If you experience chest pain or shortness of breath, CALL 911 and be transported to the hospital emergency room.  If you develope a fever above 101 F, pus (white drainage) or increased drainage or redness at the wound, or calf pain, call your surgeon's office.   Constipation Prevention      Comments:   Drink plenty of fluids.  Prune juice may be helpful.  You may use a stool softener, such as Colace (over the counter) 100 mg twice a day.  Use MiraLax (over the counter) for constipation as needed.   Increase activity slowly as tolerated      Weight bearing as tolerated      CPM      Comments:   Continuous passive motion machine (CPM):      Use the CPM from 0 degrees to 60 degrees for 8 hours per day.      You may increase by 5-10 degrees per day.  You may break it up into 2 or 3 sessions per day.      Use CPM for 1-2 weeks or until you are  told to stop.   Do not put a pillow under the knee. Place it under the heel.      TED hose      Comments:   Use stockings (TED  hose) for 2 weeks on both leg(s).  You may remove them at night for sleeping.      Follow-up Information    Follow up with GRAVES,JOHN L, MD. Schedule an appointment as soon as possible for a visit in 2 weeks.   Contact information:   [address] [place] [address] [phone]           Signed: Matthew Folks 01/04/2012, 2:50 PM

## 2012-01-04 NOTE — Progress Notes (Signed)
Dear Cliffton Asters Team Please call in Note we are changing from 5/500 to 5/325 [facility]! Denny Levy

## 2012-01-05 DIAGNOSIS — Z5181 Encounter for therapeutic drug level monitoring: Secondary | ICD-10-CM | POA: Diagnosis not present

## 2012-01-05 DIAGNOSIS — Z471 Aftercare following joint replacement surgery: Secondary | ICD-10-CM | POA: Diagnosis not present

## 2012-01-05 DIAGNOSIS — M159 Polyosteoarthritis, unspecified: Secondary | ICD-10-CM | POA: Diagnosis not present

## 2012-01-05 DIAGNOSIS — I10 Essential (primary) hypertension: Secondary | ICD-10-CM | POA: Diagnosis not present

## 2012-01-05 DIAGNOSIS — Z96659 Presence of unspecified artificial knee joint: Secondary | ICD-10-CM | POA: Diagnosis not present

## 2012-01-05 DIAGNOSIS — Z7901 Long term (current) use of anticoagulants: Secondary | ICD-10-CM | POA: Diagnosis not present

## 2012-01-06 DIAGNOSIS — Z471 Aftercare following joint replacement surgery: Secondary | ICD-10-CM | POA: Diagnosis not present

## 2012-01-06 DIAGNOSIS — I10 Essential (primary) hypertension: Secondary | ICD-10-CM | POA: Diagnosis not present

## 2012-01-06 DIAGNOSIS — Z7901 Long term (current) use of anticoagulants: Secondary | ICD-10-CM | POA: Diagnosis not present

## 2012-01-06 DIAGNOSIS — M25569 Pain in unspecified knee: Secondary | ICD-10-CM | POA: Diagnosis not present

## 2012-01-06 DIAGNOSIS — M159 Polyosteoarthritis, unspecified: Secondary | ICD-10-CM | POA: Diagnosis not present

## 2012-01-06 DIAGNOSIS — Z5181 Encounter for therapeutic drug level monitoring: Secondary | ICD-10-CM | POA: Diagnosis not present

## 2012-01-06 DIAGNOSIS — Z96659 Presence of unspecified artificial knee joint: Secondary | ICD-10-CM | POA: Diagnosis not present

## 2012-01-07 DIAGNOSIS — Z471 Aftercare following joint replacement surgery: Secondary | ICD-10-CM | POA: Diagnosis not present

## 2012-01-07 DIAGNOSIS — Z96659 Presence of unspecified artificial knee joint: Secondary | ICD-10-CM | POA: Diagnosis not present

## 2012-01-07 DIAGNOSIS — M159 Polyosteoarthritis, unspecified: Secondary | ICD-10-CM | POA: Diagnosis not present

## 2012-01-07 DIAGNOSIS — Z5181 Encounter for therapeutic drug level monitoring: Secondary | ICD-10-CM | POA: Diagnosis not present

## 2012-01-07 DIAGNOSIS — Z7901 Long term (current) use of anticoagulants: Secondary | ICD-10-CM | POA: Diagnosis not present

## 2012-01-07 DIAGNOSIS — I10 Essential (primary) hypertension: Secondary | ICD-10-CM | POA: Diagnosis not present

## 2012-01-10 DIAGNOSIS — I10 Essential (primary) hypertension: Secondary | ICD-10-CM | POA: Diagnosis not present

## 2012-01-10 DIAGNOSIS — Z7901 Long term (current) use of anticoagulants: Secondary | ICD-10-CM | POA: Diagnosis not present

## 2012-01-10 DIAGNOSIS — Z96659 Presence of unspecified artificial knee joint: Secondary | ICD-10-CM | POA: Diagnosis not present

## 2012-01-10 DIAGNOSIS — Z5181 Encounter for therapeutic drug level monitoring: Secondary | ICD-10-CM | POA: Diagnosis not present

## 2012-01-10 DIAGNOSIS — Z471 Aftercare following joint replacement surgery: Secondary | ICD-10-CM | POA: Diagnosis not present

## 2012-01-10 DIAGNOSIS — M159 Polyosteoarthritis, unspecified: Secondary | ICD-10-CM | POA: Diagnosis not present

## 2012-01-12 DIAGNOSIS — M159 Polyosteoarthritis, unspecified: Secondary | ICD-10-CM | POA: Diagnosis not present

## 2012-01-12 DIAGNOSIS — I10 Essential (primary) hypertension: Secondary | ICD-10-CM | POA: Diagnosis not present

## 2012-01-12 DIAGNOSIS — Z5181 Encounter for therapeutic drug level monitoring: Secondary | ICD-10-CM | POA: Diagnosis not present

## 2012-01-12 DIAGNOSIS — Z96659 Presence of unspecified artificial knee joint: Secondary | ICD-10-CM | POA: Diagnosis not present

## 2012-01-12 DIAGNOSIS — Z7901 Long term (current) use of anticoagulants: Secondary | ICD-10-CM | POA: Diagnosis not present

## 2012-01-12 DIAGNOSIS — Z471 Aftercare following joint replacement surgery: Secondary | ICD-10-CM | POA: Diagnosis not present

## 2012-01-13 DIAGNOSIS — Z5181 Encounter for therapeutic drug level monitoring: Secondary | ICD-10-CM | POA: Diagnosis not present

## 2012-01-13 DIAGNOSIS — Z471 Aftercare following joint replacement surgery: Secondary | ICD-10-CM | POA: Diagnosis not present

## 2012-01-13 DIAGNOSIS — Z7901 Long term (current) use of anticoagulants: Secondary | ICD-10-CM | POA: Diagnosis not present

## 2012-01-13 DIAGNOSIS — M159 Polyosteoarthritis, unspecified: Secondary | ICD-10-CM | POA: Diagnosis not present

## 2012-01-13 DIAGNOSIS — I10 Essential (primary) hypertension: Secondary | ICD-10-CM | POA: Diagnosis not present

## 2012-01-13 DIAGNOSIS — Z96659 Presence of unspecified artificial knee joint: Secondary | ICD-10-CM | POA: Diagnosis not present

## 2012-01-14 DIAGNOSIS — M159 Polyosteoarthritis, unspecified: Secondary | ICD-10-CM | POA: Diagnosis not present

## 2012-01-14 DIAGNOSIS — Z5181 Encounter for therapeutic drug level monitoring: Secondary | ICD-10-CM | POA: Diagnosis not present

## 2012-01-14 DIAGNOSIS — Z96659 Presence of unspecified artificial knee joint: Secondary | ICD-10-CM | POA: Diagnosis not present

## 2012-01-14 DIAGNOSIS — Z7901 Long term (current) use of anticoagulants: Secondary | ICD-10-CM | POA: Diagnosis not present

## 2012-01-14 DIAGNOSIS — I10 Essential (primary) hypertension: Secondary | ICD-10-CM | POA: Diagnosis not present

## 2012-01-14 DIAGNOSIS — Z471 Aftercare following joint replacement surgery: Secondary | ICD-10-CM | POA: Diagnosis not present

## 2012-01-17 DIAGNOSIS — I10 Essential (primary) hypertension: Secondary | ICD-10-CM | POA: Diagnosis not present

## 2012-01-17 DIAGNOSIS — Z96659 Presence of unspecified artificial knee joint: Secondary | ICD-10-CM | POA: Diagnosis not present

## 2012-01-17 DIAGNOSIS — M159 Polyosteoarthritis, unspecified: Secondary | ICD-10-CM | POA: Diagnosis not present

## 2012-01-17 DIAGNOSIS — Z471 Aftercare following joint replacement surgery: Secondary | ICD-10-CM | POA: Diagnosis not present

## 2012-01-17 DIAGNOSIS — Z7901 Long term (current) use of anticoagulants: Secondary | ICD-10-CM | POA: Diagnosis not present

## 2012-01-17 DIAGNOSIS — Z5181 Encounter for therapeutic drug level monitoring: Secondary | ICD-10-CM | POA: Diagnosis not present

## 2012-01-19 DIAGNOSIS — Z5181 Encounter for therapeutic drug level monitoring: Secondary | ICD-10-CM | POA: Diagnosis not present

## 2012-01-19 DIAGNOSIS — Z7901 Long term (current) use of anticoagulants: Secondary | ICD-10-CM | POA: Diagnosis not present

## 2012-01-19 DIAGNOSIS — Z96659 Presence of unspecified artificial knee joint: Secondary | ICD-10-CM | POA: Diagnosis not present

## 2012-01-19 DIAGNOSIS — M159 Polyosteoarthritis, unspecified: Secondary | ICD-10-CM | POA: Diagnosis not present

## 2012-01-19 DIAGNOSIS — I10 Essential (primary) hypertension: Secondary | ICD-10-CM | POA: Diagnosis not present

## 2012-01-19 DIAGNOSIS — Z471 Aftercare following joint replacement surgery: Secondary | ICD-10-CM | POA: Diagnosis not present

## 2012-01-21 DIAGNOSIS — M159 Polyosteoarthritis, unspecified: Secondary | ICD-10-CM | POA: Diagnosis not present

## 2012-01-21 DIAGNOSIS — Z96659 Presence of unspecified artificial knee joint: Secondary | ICD-10-CM | POA: Diagnosis not present

## 2012-01-21 DIAGNOSIS — Z7901 Long term (current) use of anticoagulants: Secondary | ICD-10-CM | POA: Diagnosis not present

## 2012-01-21 DIAGNOSIS — I10 Essential (primary) hypertension: Secondary | ICD-10-CM | POA: Diagnosis not present

## 2012-01-21 DIAGNOSIS — Z5181 Encounter for therapeutic drug level monitoring: Secondary | ICD-10-CM | POA: Diagnosis not present

## 2012-01-21 DIAGNOSIS — Z471 Aftercare following joint replacement surgery: Secondary | ICD-10-CM | POA: Diagnosis not present

## 2012-01-24 ENCOUNTER — Telehealth: Payer: Self-pay | Admitting: Family Medicine

## 2012-01-24 NOTE — Telephone Encounter (Signed)
Dear Cliffton Asters Team They are going to stop filling the 5/500 soon so I have switched everyone in the computer alreaady. The difference is 125 mg of acetaminophen so if she takes ONE extra strength tylenol otc (500 mg tylenol tablet) a day she will be at the same dose---that is the only difference.

## 2012-01-24 NOTE — Telephone Encounter (Signed)
Called pt and informed her of the change of Vicodin she will need to take 1 (ONE) extra strength tylenol a day and this will be the same as what she was getting. Pt understood and agreed.Loralee Pacas Taylor

## 2012-01-24 NOTE — Telephone Encounter (Signed)
Husband is calling for a correction on her pain medication.  He says the dose was sent in wrong.

## 2012-01-24 NOTE — Telephone Encounter (Signed)
Patient states pain medication she was given by Dr. Jennette Kettle on 09/04 is Vicodin 5/325. She has been getting 5/500. Can't find this on med list .  Also states surgeon  gave her # 60 tabs of the 5/500 mg  after her surgery on August 23.  Pharmacy is [facility].

## 2012-01-26 ENCOUNTER — Ambulatory Visit: Payer: Medicare Other | Attending: Orthopedic Surgery | Admitting: Physical Therapy

## 2012-01-26 DIAGNOSIS — IMO0001 Reserved for inherently not codable concepts without codable children: Secondary | ICD-10-CM | POA: Diagnosis not present

## 2012-01-26 DIAGNOSIS — R262 Difficulty in walking, not elsewhere classified: Secondary | ICD-10-CM | POA: Insufficient documentation

## 2012-01-26 DIAGNOSIS — M25569 Pain in unspecified knee: Secondary | ICD-10-CM | POA: Insufficient documentation

## 2012-01-26 DIAGNOSIS — M25669 Stiffness of unspecified knee, not elsewhere classified: Secondary | ICD-10-CM | POA: Insufficient documentation

## 2012-01-27 ENCOUNTER — Encounter: Payer: Self-pay | Admitting: Family Medicine

## 2012-01-27 DIAGNOSIS — M25569 Pain in unspecified knee: Secondary | ICD-10-CM | POA: Diagnosis not present

## 2012-01-28 ENCOUNTER — Ambulatory Visit: Payer: Medicare Other | Admitting: Physical Therapy

## 2012-01-31 ENCOUNTER — Ambulatory Visit: Payer: Medicare Other | Admitting: Physical Therapy

## 2012-01-31 DIAGNOSIS — IMO0001 Reserved for inherently not codable concepts without codable children: Secondary | ICD-10-CM | POA: Diagnosis not present

## 2012-01-31 DIAGNOSIS — M25669 Stiffness of unspecified knee, not elsewhere classified: Secondary | ICD-10-CM | POA: Diagnosis not present

## 2012-01-31 DIAGNOSIS — M25569 Pain in unspecified knee: Secondary | ICD-10-CM | POA: Diagnosis not present

## 2012-01-31 DIAGNOSIS — R262 Difficulty in walking, not elsewhere classified: Secondary | ICD-10-CM | POA: Diagnosis not present

## 2012-02-02 ENCOUNTER — Ambulatory Visit: Payer: Medicare Other | Attending: Orthopedic Surgery | Admitting: Physical Therapy

## 2012-02-02 DIAGNOSIS — R262 Difficulty in walking, not elsewhere classified: Secondary | ICD-10-CM | POA: Insufficient documentation

## 2012-02-02 DIAGNOSIS — M25669 Stiffness of unspecified knee, not elsewhere classified: Secondary | ICD-10-CM | POA: Diagnosis not present

## 2012-02-02 DIAGNOSIS — IMO0001 Reserved for inherently not codable concepts without codable children: Secondary | ICD-10-CM | POA: Insufficient documentation

## 2012-02-02 DIAGNOSIS — M25569 Pain in unspecified knee: Secondary | ICD-10-CM | POA: Insufficient documentation

## 2012-02-04 ENCOUNTER — Ambulatory Visit: Payer: Medicare Other | Admitting: Physical Therapy

## 2012-02-07 ENCOUNTER — Ambulatory Visit: Payer: Medicare Other | Admitting: Physical Therapy

## 2012-02-09 ENCOUNTER — Ambulatory Visit: Payer: Medicare Other | Admitting: Physical Therapy

## 2012-02-11 ENCOUNTER — Ambulatory Visit: Payer: Medicare Other | Admitting: Physical Therapy

## 2012-02-14 ENCOUNTER — Ambulatory Visit: Payer: Medicare Other | Admitting: Physical Therapy

## 2012-02-16 ENCOUNTER — Ambulatory Visit: Payer: Medicare Other | Admitting: Physical Therapy

## 2012-02-18 ENCOUNTER — Ambulatory Visit: Payer: Medicare Other | Admitting: Physical Therapy

## 2012-02-21 ENCOUNTER — Ambulatory Visit: Payer: Medicare Other | Admitting: Physical Therapy

## 2012-02-23 ENCOUNTER — Ambulatory Visit: Payer: Medicare Other | Admitting: Physical Therapy

## 2012-02-25 ENCOUNTER — Ambulatory Visit: Payer: Medicare Other | Admitting: Physical Therapy

## 2012-02-28 ENCOUNTER — Ambulatory Visit: Payer: Medicare Other | Admitting: Physical Therapy

## 2012-03-01 ENCOUNTER — Ambulatory Visit: Payer: Medicare Other | Admitting: Physical Therapy

## 2012-03-03 ENCOUNTER — Ambulatory Visit: Payer: Medicare Other | Admitting: Physical Therapy

## 2012-03-10 DIAGNOSIS — Z23 Encounter for immunization: Secondary | ICD-10-CM | POA: Diagnosis not present

## 2012-05-01 ENCOUNTER — Other Ambulatory Visit: Payer: Self-pay | Admitting: *Deleted

## 2012-05-01 MED ORDER — PRAVASTATIN SODIUM 40 MG PO TABS
40.0000 mg | ORAL_TABLET | Freq: Every day | ORAL | Status: DC
Start: 2012-05-01 — End: 2013-04-13

## 2012-05-18 ENCOUNTER — Other Ambulatory Visit: Payer: Self-pay | Admitting: *Deleted

## 2012-05-18 MED ORDER — HYDROCODONE-ACETAMINOPHEN 5-325 MG PO TABS: ORAL_TABLET | ORAL | Status: DC

## 2012-05-18 MED ORDER — QUETIAPINE FUMARATE 25 MG PO TABS: 25.0000 mg | ORAL_TABLET | Freq: Every day | ORAL | Status: DC

## 2012-05-18 NOTE — Telephone Encounter (Signed)
Also  needs refill on Vicodin.

## 2012-05-18 NOTE — Telephone Encounter (Signed)
Husband picked up refills S/p 2nd TKR--had a bit of a rough time but slowly getting better.

## 2012-05-22 DIAGNOSIS — H353190 Nonexudative age-related macular degeneration, unspecified eye, stage unspecified: Secondary | ICD-10-CM | POA: Diagnosis not present

## 2012-06-12 DIAGNOSIS — M25569 Pain in unspecified knee: Secondary | ICD-10-CM | POA: Diagnosis not present

## 2012-06-21 ENCOUNTER — Ambulatory Visit (INDEPENDENT_AMBULATORY_CARE_PROVIDER_SITE_OTHER): Payer: Medicare Other | Admitting: Family Medicine

## 2012-06-21 ENCOUNTER — Encounter: Payer: Self-pay | Admitting: Family Medicine

## 2012-06-21 VITALS — BP 155/47 | HR 69 | Temp 97.4°F | Wt 145.6 lb

## 2012-06-21 DIAGNOSIS — S43429A Sprain of unspecified rotator cuff capsule, initial encounter: Secondary | ICD-10-CM

## 2012-06-21 DIAGNOSIS — M75100 Unspecified rotator cuff tear or rupture of unspecified shoulder, not specified as traumatic: Secondary | ICD-10-CM

## 2012-06-21 DIAGNOSIS — Z96659 Presence of unspecified artificial knee joint: Secondary | ICD-10-CM

## 2012-06-21 DIAGNOSIS — M25519 Pain in unspecified shoulder: Secondary | ICD-10-CM

## 2012-06-21 NOTE — Progress Notes (Signed)
  Subjective:    Patient ID: Shelley Black, female    DOB: May 26, 1930, 79 y.o.   MRN: [id number]  HPI  Right shoulder pain. Worse with any attempted overhead motion or reaching to the side or backward. 2 weeks ago she fell while she was holding a box. She fell from a standing position after slipping on a polished floor while wearing bedroom slippers. She fell on her right shoulder and face. She was unable to get up because she could not roll over to the kneeling position secondary to bilateral knee replacements. Other than that she suffered no sequela. Her shoulder really did not bother her too much still this morning. In fact she forgot about the fall until we started discussing trauma. This morning her shoulder is 6/10 pain. Worse with movement. Aching. No weakness  Review of Systems Denies weakness or numbness in the right upper extremity. Denies fever.    Objective:   Physical Exam  Vital signs are reviewed and elevated blood pressure is noted but consistent with her goal. Blood pressure. In the GENERAL: Well-developed female no acute distress SHOULDER: Right. Decreased range of motion in forward flexion to about 90 abduction to 90. Weakness and pain with external rotation. Unable to tolerate supraspinatus testing. Distally she is neurovascularly intact.  INJECTION: Patient was given informed consent, signed copy in the chart. Appropriate time out was taken. Area prepped and draped in usual sterile fashion. One cc of methylprednisolone 40 mg/ml plus  4 cc of 1% lidocaine without epinephrine was injected into the right subacromial bursa using a(n) posterior approach. The patient tolerated the procedure well. There were no complications. Post procedure instructions were given.     Assessment & Plan:  #1. Shoulder pain. Most likely this is a rotator cuff tear given mechanism of injury. At her age I doubt that we would consider repair. I'll give her subacromial injection today for some pain  relief. She will start pendulum exercises for range of motion. I will [facility] where [facility] will do an ultrasound to further assess what I think is a rotator cuff tear. She'll call me in the interim with problems.

## 2012-06-26 ENCOUNTER — Other Ambulatory Visit: Payer: Self-pay | Admitting: *Deleted

## 2012-06-26 MED ORDER — HYDROCHLOROTHIAZIDE 25 MG PO TABS: 25.0000 mg | ORAL_TABLET | Freq: Every day | ORAL | Status: DC

## 2012-06-30 ENCOUNTER — Other Ambulatory Visit: Payer: Medicare Other | Admitting: Family Medicine

## 2012-07-07 ENCOUNTER — Other Ambulatory Visit: Payer: Medicare Other | Admitting: Family Medicine

## 2012-07-14 ENCOUNTER — Ambulatory Visit (INDEPENDENT_AMBULATORY_CARE_PROVIDER_SITE_OTHER): Payer: Medicare Other | Admitting: Family Medicine

## 2012-07-14 VITALS — BP 144/78 | Ht 65.0 in | Wt 142.0 lb

## 2012-07-14 DIAGNOSIS — S43429A Sprain of unspecified rotator cuff capsule, initial encounter: Secondary | ICD-10-CM

## 2012-07-14 DIAGNOSIS — M75101 Unspecified rotator cuff tear or rupture of right shoulder, not specified as traumatic: Secondary | ICD-10-CM

## 2012-07-14 NOTE — Progress Notes (Signed)
  Subjective:    Patient ID: Shelley Black, female    DOB: 12-01-1930, 79 y.o.   MRN: [id number]  HPI Date of injury 06/16/2012. Approx. Larey Seat and landed on shoulder.Was seen in [facility] and dx likely rotator cuff tear. Here for f/u and   Here for [place] as I had requested.  Pain is about 40%  Better. Still cannot  Raise her arm above shoulder level on right. No hand numbness   Review of Systems Pertinent review of systems: negative for fever or unusual weight change.     Objective:   Physical Exam  Vital signs reviewed. GENERAL: Well developed, well nourished, no acute distress SHIOULDR: right cannot elevate above 90 degrees in forward flexion or abduction. Bicep muscle strength intact  [place]: almost full thickness tear off suprasoinatus and full thickness tear of subscap with a lot of surrounding fluid. The biceop tendon is also irregular in short axis view but appersr intact on longitudinal view; a lot of surrounding fluid in bicipital groove. Tendon does NOT appear to displace with movement but is in elevated position due to l;arge effusion surrounding proximal portion of tendon.      Assessment & Plan:  Rotator cuff tear as above She does not wish to consider surgery which is appropriate as I doubt she is a srhical candidate. She is improving a lot already from pain standpoint. We discussed NTG patch as an option but she decided to go with rehab exercises at home and f/u prn.

## 2012-07-28 ENCOUNTER — Other Ambulatory Visit: Payer: Self-pay | Admitting: Family Medicine

## 2012-07-28 MED ORDER — OXYCODONE HCL 20 MG PO TB12: ORAL_TABLET | ORAL | Status: DC

## 2012-07-28 NOTE — Progress Notes (Signed)
Was doing well with shoulder until 3 days ago when she awoke w worse pain---vicodin not touching it---we will try oxycodone and she will let me know

## 2012-07-29 ENCOUNTER — Encounter (HOSPITAL_BASED_OUTPATIENT_CLINIC_OR_DEPARTMENT_OTHER): Payer: Self-pay

## 2012-07-29 ENCOUNTER — Emergency Department (HOSPITAL_BASED_OUTPATIENT_CLINIC_OR_DEPARTMENT_OTHER): Payer: Medicare Other

## 2012-07-29 ENCOUNTER — Ambulatory Visit (HOSPITAL_COMMUNITY): Admit: 2012-07-29 | Payer: Self-pay | Admitting: Cardiology

## 2012-07-29 ENCOUNTER — Encounter (HOSPITAL_COMMUNITY)
Admission: EM | Disposition: A | Discharge status: Home / Self Care | Payer: Self-pay | Source: Home / Self Care | Attending: Cardiology

## 2012-07-29 ENCOUNTER — Inpatient Hospital Stay (HOSPITAL_BASED_OUTPATIENT_CLINIC_OR_DEPARTMENT_OTHER)
Admission: EM | Admit: 2012-07-29 | Discharge: 2012-08-01 | DRG: 242 | Disposition: A | Discharge status: Home / Self Care | Payer: Medicare Other | Attending: Cardiology | Admitting: Cardiology

## 2012-07-29 DIAGNOSIS — I469 Cardiac arrest, cause unspecified: Secondary | ICD-10-CM | POA: Diagnosis not present

## 2012-07-29 DIAGNOSIS — I442 Atrioventricular block, complete: Principal | ICD-10-CM

## 2012-07-29 DIAGNOSIS — Z96659 Presence of unspecified artificial knee joint: Secondary | ICD-10-CM | POA: Diagnosis not present

## 2012-07-29 DIAGNOSIS — R55 Syncope and collapse: Secondary | ICD-10-CM

## 2012-07-29 DIAGNOSIS — I472 Ventricular tachycardia, unspecified: Secondary | ICD-10-CM | POA: Diagnosis not present

## 2012-07-29 DIAGNOSIS — E785 Hyperlipidemia, unspecified: Secondary | ICD-10-CM | POA: Diagnosis present

## 2012-07-29 DIAGNOSIS — I059 Rheumatic mitral valve disease, unspecified: Secondary | ICD-10-CM | POA: Diagnosis not present

## 2012-07-29 DIAGNOSIS — R404 Transient alteration of awareness: Secondary | ICD-10-CM | POA: Diagnosis not present

## 2012-07-29 DIAGNOSIS — R110 Nausea: Secondary | ICD-10-CM | POA: Diagnosis not present

## 2012-07-29 DIAGNOSIS — M1710 Unilateral primary osteoarthritis, unspecified knee: Secondary | ICD-10-CM

## 2012-07-29 DIAGNOSIS — IMO0002 Reserved for concepts with insufficient information to code with codable children: Secondary | ICD-10-CM

## 2012-07-29 DIAGNOSIS — I498 Other specified cardiac arrhythmias: Secondary | ICD-10-CM | POA: Diagnosis not present

## 2012-07-29 DIAGNOSIS — R112 Nausea with vomiting, unspecified: Secondary | ICD-10-CM | POA: Diagnosis not present

## 2012-07-29 DIAGNOSIS — I2510 Atherosclerotic heart disease of native coronary artery without angina pectoris: Secondary | ICD-10-CM | POA: Diagnosis present

## 2012-07-29 DIAGNOSIS — I10 Essential (primary) hypertension: Secondary | ICD-10-CM

## 2012-07-29 DIAGNOSIS — G4700 Insomnia, unspecified: Secondary | ICD-10-CM

## 2012-07-29 DIAGNOSIS — Z23 Encounter for immunization: Secondary | ICD-10-CM

## 2012-07-29 DIAGNOSIS — E049 Nontoxic goiter, unspecified: Secondary | ICD-10-CM

## 2012-07-29 DIAGNOSIS — Z950 Presence of cardiac pacemaker: Secondary | ICD-10-CM | POA: Diagnosis not present

## 2012-07-29 DIAGNOSIS — M129 Arthropathy, unspecified: Secondary | ICD-10-CM | POA: Diagnosis present

## 2012-07-29 DIAGNOSIS — D126 Benign neoplasm of colon, unspecified: Secondary | ICD-10-CM

## 2012-07-29 DIAGNOSIS — I4729 Other ventricular tachycardia: Secondary | ICD-10-CM | POA: Diagnosis not present

## 2012-07-29 DIAGNOSIS — E860 Dehydration: Secondary | ICD-10-CM | POA: Diagnosis present

## 2012-07-29 DIAGNOSIS — N83209 Unspecified ovarian cyst, unspecified side: Secondary | ICD-10-CM

## 2012-07-29 HISTORY — PX: LEFT HEART CATHETERIZATION WITH CORONARY ANGIOGRAM: SHX5451

## 2012-07-29 HISTORY — PX: TEMPORARY PACEMAKER INSERTION: SHX5471

## 2012-07-29 LAB — PROTIME-INR
INR: 1.09 (ref 0.00–1.49)
Prothrombin Time: 14 seconds (ref 11.6–15.2)

## 2012-07-29 LAB — COMPREHENSIVE METABOLIC PANEL
ALT: 38 U/L — ABNORMAL HIGH (ref 0–35)
AST: 24 U/L (ref 0–37)
Albumin: 3.5 g/dL (ref 3.5–5.2)
CO2: 22 mEq/L (ref 19–32)
Calcium: 10.8 mg/dL — ABNORMAL HIGH (ref 8.4–10.5)
Chloride: 101 mEq/L (ref 96–112)
GFR calc non Af Amer: 44 mL/min — ABNORMAL LOW (ref >90)
Sodium: 137 mEq/L (ref 135–145)
Total Bilirubin: 0.2 mg/dL — ABNORMAL LOW (ref 0.3–1.2)

## 2012-07-29 LAB — CBC WITH DIFFERENTIAL/PLATELET
Basophils Absolute: 0 10*3/uL (ref 0.0–0.1)
Eosinophils Relative: 0 % (ref 0–5)
HCT: 33.6 % — ABNORMAL LOW (ref 36.0–46.0)
Lymphocytes Relative: 10 % — ABNORMAL LOW (ref 12–46)
Lymphocytes Relative: 17 % (ref 12–46)
Lymphs Abs: 1.1 10*3/uL (ref 0.7–4.0)
MCV: 91.3 fL (ref 78.0–100.0)
Monocytes Absolute: 0.6 10*3/uL (ref 0.1–1.0)
Neutro Abs: 9 10*3/uL — ABNORMAL HIGH (ref 1.7–7.7)
Platelets: 313 10*3/uL (ref 150–400)
RBC: 3.68 MIL/uL — ABNORMAL LOW (ref 3.87–5.11)
RDW: 13.7 % (ref 11.5–15.5)
WBC: 10.6 10*3/uL — ABNORMAL HIGH (ref 4.0–10.5)
WBC: 11.7 10*3/uL — ABNORMAL HIGH (ref 4.0–10.5)

## 2012-07-29 LAB — APTT: aPTT: 30 seconds (ref 24–37)

## 2012-07-29 LAB — BASIC METABOLIC PANEL
CO2: 25 mEq/L (ref 19–32)
Calcium: 11.1 mg/dL — ABNORMAL HIGH (ref 8.4–10.5)
Creatinine, Ser: 1.2 mg/dL — ABNORMAL HIGH (ref 0.50–1.10)
Glucose, Bld: 160 mg/dL — ABNORMAL HIGH (ref 70–99)
Sodium: 138 mEq/L (ref 135–145)

## 2012-07-29 LAB — MAGNESIUM: Magnesium: 1.9 mg/dL (ref 1.5–2.5)

## 2012-07-29 SURGERY — LEFT HEART CATHETERIZATION WITH CORONARY ANGIOGRAM

## 2012-07-29 MED ORDER — SODIUM CHLORIDE 0.9 % IV SOLN
INTRAVENOUS | Status: DC
  Administered 2012-07-29: 14:00:00 via INTRAVENOUS

## 2012-07-29 MED ORDER — NITROGLYCERIN 0.4 MG SL SUBL: 0.4000 mg | SUBLINGUAL_TABLET | SUBLINGUAL | Status: DC | PRN

## 2012-07-29 MED ORDER — HYDROCODONE-ACETAMINOPHEN 5-325 MG PO TABS
1.0000 | ORAL_TABLET | Freq: Three times a day (TID) | ORAL | Status: DC | PRN
  Administered 2012-07-29 – 2012-07-30 (×2): 1 via ORAL
  Filled 2012-07-29 (×2): qty 1

## 2012-07-29 MED ORDER — ACETAMINOPHEN 325 MG PO TABS
650.0000 mg | ORAL_TABLET | ORAL | Status: DC | PRN
  Administered 2012-07-29 – 2012-07-30 (×3): 650 mg via ORAL
  Filled 2012-07-29 (×3): qty 2

## 2012-07-29 MED ORDER — ONDANSETRON HCL 4 MG/2ML IJ SOLN: 4.0000 mg | Freq: Four times a day (QID) | INTRAMUSCULAR | Status: DC | PRN

## 2012-07-29 MED ORDER — SODIUM CHLORIDE 0.9 % IV BOLUS (SEPSIS)
500.0000 mL | Freq: Once | INTRAVENOUS | Status: AC
  Administered 2012-07-29: 500 mL via INTRAVENOUS

## 2012-07-29 MED ORDER — LIDOCAINE HCL (PF) 1 % IJ SOLN
INTRAMUSCULAR | Status: AC
  Filled 2012-07-29: qty 30

## 2012-07-29 MED ORDER — ATROPINE SULFATE 0.1 MG/ML IJ SOLN: 0.5000 mg | Freq: Once | INTRAMUSCULAR | Status: DC

## 2012-07-29 MED ORDER — MAGNESIUM SULFATE 40 MG/ML IJ SOLN
2.0000 g | Freq: Once | INTRAMUSCULAR | Status: AC
  Administered 2012-07-29: 2 g via INTRAVENOUS
  Filled 2012-07-29: qty 50

## 2012-07-29 MED ORDER — AMIODARONE IV BOLUS ONLY 150 MG/100ML
150.0000 mg | Freq: Once | INTRAVENOUS | Status: AC
  Administered 2012-07-29: 150 mg via INTRAVENOUS
  Filled 2012-07-29: qty 100

## 2012-07-29 MED ORDER — HEPARIN (PORCINE) IN NACL 2-0.9 UNIT/ML-% IJ SOLN
INTRAMUSCULAR | Status: AC
  Filled 2012-07-29: qty 1000

## 2012-07-29 MED ORDER — FENTANYL CITRATE 0.05 MG/ML IJ SOLN
INTRAMUSCULAR | Status: AC
  Filled 2012-07-29: qty 2

## 2012-07-29 MED ORDER — HEPARIN SODIUM (PORCINE) 5000 UNIT/ML IJ SOLN
5000.0000 [IU] | Freq: Three times a day (TID) | INTRAMUSCULAR | Status: DC
  Filled 2012-07-29 (×2): qty 1

## 2012-07-29 MED ORDER — MIDAZOLAM HCL 2 MG/2ML IJ SOLN
INTRAMUSCULAR | Status: AC
  Filled 2012-07-29: qty 2

## 2012-07-29 MED ORDER — SODIUM CHLORIDE 0.9 % IV SOLN: INTRAVENOUS | Status: DC

## 2012-07-29 MED ORDER — ATROPINE SULFATE 0.1 MG/ML IJ SOLN
INTRAMUSCULAR | Status: AC
  Administered 2012-07-29: 0.5 mg
  Filled 2012-07-29: qty 10

## 2012-07-29 MED ORDER — ASPIRIN EC 81 MG PO TBEC
81.0000 mg | DELAYED_RELEASE_TABLET | Freq: Every day | ORAL | Status: DC
  Administered 2012-07-31: 81 mg via ORAL
  Filled 2012-07-29 (×3): qty 1

## 2012-07-29 MED ORDER — SODIUM CHLORIDE 0.9 % IV SOLN
1.0000 mL/kg/h | INTRAVENOUS | Status: AC
  Administered 2012-07-29: 1 mL/kg/h via INTRAVENOUS

## 2012-07-29 MED ORDER — HEPARIN SODIUM (PORCINE) 5000 UNIT/ML IJ SOLN
5000.0000 [IU] | Freq: Three times a day (TID) | INTRAMUSCULAR | Status: DC
  Administered 2012-07-30 – 2012-07-31 (×4): 5000 [IU] via SUBCUTANEOUS
  Filled 2012-07-29 (×7): qty 1

## 2012-07-29 NOTE — ED Provider Notes (Signed)
History     CSN: [id number]  Arrival date & time 07/29/12  1342   First MD Initiated Contact with Patient 07/29/12 1354      Chief Complaint  Patient presents with  . Nausea  . Emesis  . Hypertension  . Bradycardia    (Consider location/radiation/quality/duration/timing/severity/associated sxs/prior treatment) Patient is a 79 y.o. female presenting with vomiting and hypertension.  Emesis Associated symptoms: no abdominal pain, no diarrhea and no headaches   Hypertension Pertinent negatives include no chest pain, no abdominal pain, no headaches and no shortness of breath.  patient was sent from urgent care after nausea and vomiting. She had a syncopal episode while there. No chest pain. The report from the outside urgent care reported sinus bradycardia, however the EKG shows third degree heart block. Her blood pressure is elevated here. She reportedly did not have much of a pulse after the syncope. No abdominal pain. No diarrhea. She states the nausea is still present.   Past Medical History  Diagnosis Date  . Heart murmur     not seen by cardiologist  . Hypertension   . Arthritis     Past Surgical History  Procedure Laterality Date  . Joint replacement      Left knee  . Thyroid surgery    . Appendectomy    . Eye surgery      cataract bil  . Total knee arthroplasty  12/24/2011    Procedure: TOTAL KNEE ARTHROPLASTY;  Surgeon: Harvie Junior, MD;  Location: [facility] OR;  Service: Orthopedics;  Laterality: Right;  general anesthesia with pre op femoral nerve block  . Rotator cuff repair Right     History reviewed. No pertinent family history.  History  Substance Use Topics  . Smoking status: Never Smoker   . Smokeless tobacco: Never Used  . Alcohol Use: No    OB History   Grav Para Term Preterm Abortions TAB SAB Ect Mult Living                  Review of Systems  Constitutional: Negative for activity change and appetite change.  HENT: Negative for neck stiffness.    Eyes: Negative for pain.  Respiratory: Negative for chest tightness and shortness of breath.   Cardiovascular: Negative for chest pain and leg swelling.  Gastrointestinal: Positive for nausea and vomiting. Negative for abdominal pain and diarrhea.  Genitourinary: Negative for flank pain.  Musculoskeletal: Negative for back pain.       Right shoulder pain  Skin: Negative for rash.  Neurological: Positive for syncope. Negative for weakness, numbness and headaches.  Psychiatric/Behavioral: Negative for behavioral problems.    Allergies  Review of patient's allergies indicates no known allergies.  Home Medications   Current Outpatient Rx  Name  Route  Sig  Dispense  Refill  . Cholecalciferol (VITAMIN D3) 2000 UNITS TABS   Oral   Take 1 tablet by mouth daily.         . hydrochlorothiazide (HYDRODIURIL) 25 MG tablet   Oral   Take 1 tablet (25 mg total) by mouth daily.   90 tablet   3   . HYDROcodone-acetaminophen (NORCO/VICODIN) 5-325 MG per tablet      Take one or two tablets by mouth every 8 hours as needed for pain   180 tablet   5   . Multiple Vitamin (MULTIVITAMIN WITH MINERALS) TABS   Oral   Take 1 tablet by mouth daily.         Marland Kitchen  Omega-3 Fatty Acids (FISH OIL TRIPLE STRENGTH PO)   Oral   Take 1 capsule by mouth daily.         Marland Kitchen oxyCODONE (OXYCONTIN) 20 MG 12 hr tablet      Take by mouth one or two tablets every 8-12 hours prn pain Do not take with the vicodin   50 tablet   0   . pravastatin (PRAVACHOL) 40 MG tablet   Oral   Take 1 tablet (40 mg total) by mouth daily.   90 tablet   3   . QUEtiapine (SEROQUEL) 25 MG tablet   Oral   Take 1 tablet (25 mg total) by mouth at bedtime.   30 tablet   12   . ramipril (ALTACE) 10 MG capsule   Oral   Take 10 mg by mouth 2 (two) times daily.           BP 172/58  Pulse 53  Temp(Src) 97.9 F (36.6 C) (Oral)  Resp 20  Ht 5\' 5"  (1.651 m)  Wt 147 lb (66.679 kg)  BMI 24.46 kg/m2  SpO2 98%  Physical  Exam  Nursing note and vitals reviewed. Constitutional: She is oriented to person, place, and time. She appears well-developed and well-nourished.  HENT:  Head: Normocephalic and atraumatic.  Eyes: EOM are normal. Pupils are equal, round, and reactive to light.  Neck: Normal range of motion. Neck supple.  Cardiovascular: Regular rhythm and normal heart sounds.   No murmur heard. bradycardia  Pulmonary/Chest: Effort normal and breath sounds normal. No respiratory distress. She has no wheezes. She has no rales.  Abdominal: Soft. Bowel sounds are normal. She exhibits no distension. There is no tenderness. There is no rebound and no guarding.  Musculoskeletal:  Mild tenderness to right shoulder.  Neurological: She is alert and oriented to person, place, and time. No cranial nerve deficit.  Skin: Skin is warm and dry.  Psychiatric: She has a normal mood and affect. Her speech is normal.    ED Course  Procedures (including critical care time)  Labs Reviewed  CBC WITH DIFFERENTIAL - Abnormal; Notable for the following:    WBC 10.6 (*)    RBC 3.68 (*)    Hemoglobin 11.4 (*)    HCT 33.6 (*)    Neutrophils Relative 84 (*)    Neutro Abs 8.9 (*)    Lymphocytes Relative 10 (*)    All other components within normal limits  BASIC METABOLIC PANEL - Abnormal; Notable for the following:    Glucose, Bld 160 (*)    BUN 53 (*)    Creatinine, Ser 1.20 (*)    Calcium 11.1 (*)    GFR calc non Af Amer 42 (*)    GFR calc Af Amer 48 (*)    All other components within normal limits  TROPONIN I  MAGNESIUM   Dg Chest Port 1 View  07/29/2012  *RADIOLOGY REPORT*  Clinical Data: Nausea.  Hypertension.  PORTABLE CHEST - 1 VIEW  Comparison: 12/20/2011.  Findings: Borderline enlarged cardiac silhouette and mildly prominent pulmonary vasculature, accentuated by the poor inspiration and the portable AP technique.  Clear lungs.  Diffuse osteopenia.  IMPRESSION: No acute abnormality.   Original Report  Authenticated By: Beckie Salts, M.D.      1. Complete heart block   2. Ventricular tachycardia      Date: 07/29/2012  Rate: 31  Rhythm: Complete heart block with narrow escape  QRS Axis: normal  Intervals:  complette  heart blocckk  ST/T Wave abnormalities: nonspecific ST/T changes  Conduction Disutrbances:complete heart block  Narrative Interpretation:  U waves  Old EKG Reviewed: changes noted  Date: 07/29/2012  Rate: 31  Rhythm: Complete heart block and possible sinus   QRS Axis: normal  Intervals: Complete heart block  ST/T Wave abnormalities: nonspecific ST/T changes  Conduction Disutrbances:complete heart block  Narrative Interpretation: Some possible sinus beats  Old EKG Reviewed: unchanged  CRITICAL CARE Performed by: Billee Cashing   Total critical care time: 45  Critical care time was exclusive of separately billable procedures and treating other patients.  Critical care was necessary to treat or prevent imminent or life-threatening deterioration.  Critical care was time spent personally by me on the following activities: development of treatment plan with patient and/or surrogate as well as nursing, discussions with consultants, evaluation of patient's response to treatment, examination of patient, obtaining history from patient or surrogate, ordering and performing treatments and interventions, ordering and review of laboratory studies, ordering and review of radiographic studies, pulse oximetry and re-evaluation of patient's condition.    MDM  Patient presents with acute heart block. Also nausea and some dehydration. While in ER she had episodes of ventricular tachycardia that were self-limited. IV amiodarone was given here. Atropine was also given with minimal response. Patient's blood pressures been maintained with a complete heart block. Patient be admitted to step down unit at [facility]. Pacer pads are in place.        Juliet Rude.  Rubin Payor, MD 07/29/12 [phone]

## 2012-07-29 NOTE — ED Notes (Signed)
Per [facility], Pt with nausea x3 days, vomiting since 0200, went to urgent care today and had syncopal episode, BP 200/68, HR 32, rr 18, given 4mg  zofran iv, PIV 20g in L AC.  En route vitals were 186/72 HR 50 and RR 18.  Pt is alert/oriented and appears to be in complete heart block.  CBG was 169 per [facility]

## 2012-07-29 NOTE — CV Procedure (Signed)
   Cardiac Catheterization Procedure Note  Name: Shelley Black MRN: [id number] DOB: 1931/02/05  Procedure: Left Heart Cath, Selective Coronary Angiography, LV angiography, temporary transvenous pacemaker insertion  Indication: 79 year old white female who presents with recurrent syncope. She has complete heart block and recurrent episodes of sustained ventricular tachycardia. Temporary transvenous pacemaker was recommended. We also recommend a left heart catheterization to rule out an ischemic etiology to her arrhythmia.   Procedural details: The right groin was prepped, draped, and anesthetized with 1% lidocaine. Using modified Seldinger technique, a 6 French sheath was introduced into the right femoral vein. Under fluoroscopic guidance we inserted a temporary transvenous pacemaker lead into the right ventricular apex. We had excellent capture. The patient was paced at a rate of 80 beats per minute. Threshold was less than 0.3 MA. A 5 French sheath was introduced into the right femoral artery. Standard Judkins catheters were used for coronary angiography and left ventriculography. Catheter exchanges were performed over a guidewire. There were no immediate procedural complications. The patient was transferred to the post catheterization recovery area for further monitoring.  Procedural Findings: Hemodynamics:  AO 172/72 with a mean of 110 mmHg LV 174/19 mmHg   Coronary angiography: Coronary dominance: right  Left mainstem: The left main coronary is moderately calcified. It has less than 10% irregularities.  Left anterior descending (LAD): The left anterior descending artery is moderately calcified in the proximal vessel. It has minor irregularities less than 10-20%.  Left circumflex (LCx): The left circumflex is a large vessel that gives rise to 2 marginal branches. It has mild ectasia and mild irregularities less than 20%.  Right coronary artery (RCA): The right coronary is a large  dominant vessel. It has 20% disease in the proximal vessel. It is otherwise normal.  Left ventriculography: Left ventricular systolic function is normal, LVEF is estimated at 65%, there is no significant mitral regurgitation, the aortic  is diffusely calcified.  Final Conclusions:   1. Mild nonobstructive coronary disease. 2. Normal left ventricular function. 3. Successful insertion of a temporary transvenous pacemaker wire.  Recommendations: Continue temporary pacing. Plan for permanent pacemaker placement on Monday.  Theron Arista [facility] 07/29/2012, 6:06 PM

## 2012-07-29 NOTE — Progress Notes (Signed)
ELDERLY CAUCASION FEMALE ADMITTED VIA STRETCHER IN ACUTE DISTRESS.  HEART RATE 30/M W/FREQUENT BRUSTS VT.  DENIES CHEST PAIN BUT C/O SOB AND SOMETIMES DIZZINESS.  DR. Radene Knee.   PREARED TO GO TO CATH LAB FOR TEMP. PACEMAKER.  FAMILY HERE.

## 2012-07-29 NOTE — ED Notes (Signed)
Pt had shortly sustained v-tach for about 10 seconds, pt was alert at this time, but stated that she felt dizzy.  Pt was able to held her breath and vagal to break the rate. Post episode BP: 205/142 HR 48 RR16 Spo2 98% on 2L O2 via [place]

## 2012-07-29 NOTE — ED Notes (Signed)
3 EKG's have been done and given to Dr. Rubin Payor. The patient has been placed on the Zoll. Per Dr. Arlington Calix verbal order.

## 2012-07-29 NOTE — H&P (Addendum)
Admit date: 07/29/2012 Referring Physician Dr. Rubin Payor Primary Cardiologist Dr. Jennette Kettle Chief complaint/reason for admission:complete heart block and ventricular tachycardia  HPI: This is a 79 yo WF with a history of HTN who was in her USOH until 2am this am when she awakened with nausea and vomiting.  Apparently she had been nauseated for about 3 days.  She vomited a few times and then later in the morning her family wanted to get her to go see her doctor.  She got up and got very dizzy and had presyncope.  They took her to [facility].  In the ER she was found to be bradycardic with a HR in the 20-30's with intermittent runs of nonsustained ventricular tachycardia.  She was given a bolus of IV amio by Dr. Rubin Payor and then we were contacted about transfer to [facility].  In route to [facility] she had sustained VTACH with loss of consciousness and was defibrillated with 200J back to sinus rhythm with complete heart block.  Labs at [facility] were normal with magnesium at 1.9.  She denies any chest pain but has had some SOB as well as diaphoresis.    PMH:    Past Medical History  Diagnosis Date  . Heart murmur     not seen by cardiologist  . Hypertension    dyslipidemia   . Arthritis     PSH:    Past Surgical History  Procedure Laterality Date  . Joint replacement      Left knee  . Thyroid surgery    . Appendectomy    . Eye surgery      cataract bil  . Total knee arthroplasty  12/24/2011    Procedure: TOTAL KNEE ARTHROPLASTY;  Surgeon: Harvie Junior, MD;  Location: [facility] OR;  Service: Orthopedics;  Laterality: Right;  general anesthesia with pre op femoral nerve block  . Rotator cuff repair Right     ALLERGIES:   Review of patient's allergies indicates no known allergies.  Prior to Admit Meds:   Prescriptions prior to admission  Medication Sig Dispense Refill  . Cholecalciferol (VITAMIN D3) 2000 UNITS TABS Take 1 tablet by mouth daily.      . hydrochlorothiazide (HYDRODIURIL) 25 MG tablet Take 1  tablet (25 mg total) by mouth daily.  90 tablet  3  . HYDROcodone-acetaminophen (NORCO/VICODIN) 5-325 MG per tablet Take one or two tablets by mouth every 8 hours as needed for pain  180 tablet  5  . Multiple Vitamin (MULTIVITAMIN WITH MINERALS) TABS Take 1 tablet by mouth daily.      . Omega-3 Fatty Acids (FISH OIL TRIPLE STRENGTH PO) Take 1 capsule by mouth daily.      Marland Kitchen oxyCODONE (OXYCONTIN) 20 MG 12 hr tablet Take by mouth one or two tablets every 8-12 hours prn pain Do not take with the vicodin  50 tablet  0  . pravastatin (PRAVACHOL) 40 MG tablet Take 1 tablet (40 mg total) by mouth daily.  90 tablet  3  . QUEtiapine (SEROQUEL) 25 MG tablet Take 1 tablet (25 mg total) by mouth at bedtime.  30 tablet  12  . ramipril (ALTACE) 10 MG capsule Take 10 mg by mouth 2 (two) times daily.       Family HX:   History reviewed. No pertinent family history. Social HX:    History   Social History  . Marital Status: Married    Spouse Name: N/A    Number of Children: N/A  . Years  of Education: N/A   Occupational History  . Not on file.   Social History Main Topics  . Smoking status: Never Smoker   . Smokeless tobacco: Never Used  . Alcohol Use: No  . Drug Use: No  . Sexually Active: Not on file   Other Topics Concern  . Not on file   Social History Narrative  . No narrative on file     ROS:  All 11 ROS were addressed and are negative except what is stated in the HPI  PHYSICAL EXAM Filed Vitals:   07/29/12 1632  BP: 136/101  Pulse: 73  Temp:   Resp: 22   General: Well developed, well nourished, in no acute distress Head: Eyes PERRLA, No xanthomas.   Normal cephalic and atramatic  Lungs:   Clear bilaterally to auscultation and percussion. Heart:   HRRR S1 S2 Pulses are 2+ & equal.  bradycardic            No carotid bruit. No JVD.  No abdominal bruits. No femoral bruits. Abdomen: Bowel sounds are positive, abdomen soft and non-tender without masses Extremities:   No clubbing,  cyanosis or edema.  DP +1 Neuro: Alert and oriented X 3. Psych:  Good affect, responds appropriately   Labs:   Lab Results  Component Value Date   WBC 10.6* 07/29/2012   HGB 11.4* 07/29/2012   HCT 33.6* 07/29/2012   MCV 91.3 07/29/2012   PLT 385 07/29/2012    Recent Labs Lab 07/29/12 1356  NA 138  K 4.4  CL 98  CO2 25  BUN 53*  CREATININE 1.20*  CALCIUM 11.1*  GLUCOSE 160*   Lab Results  Component Value Date   TROPONINI <0.30 07/29/2012   No results found for this basename: PTT   Lab Results  Component Value Date   INR 1.44 12/28/2011   INR 1.47 12/27/2011   INR 1.34 12/26/2011     Lab Results  Component Value Date   CHOL 198 08/27/2011   CHOL 171 08/21/2010   CHOL 194 06/30/2009   Lab Results  Component Value Date   HDL 51 08/27/2011   HDL 45 08/09/8117   HDL 58 1/47/8295   Lab Results  Component Value Date   LDLCALC 93 08/27/2011   LDLCALC 67 08/21/2010   LDLCALC 103* 06/30/2009   Lab Results  Component Value Date   TRIG 269* 08/27/2011   TRIG 296* 08/21/2010   TRIG 165* 06/30/2009   Lab Results  Component Value Date   CHOLHDL 3.9 08/27/2011   CHOLHDL 3.8 08/21/2010   CHOLHDL 3.3 Ratio 06/30/2009   Lab Results  Component Value Date   LDLDIRECT 114* 06/21/2007      Radiology:  *RADIOLOGY REPORT*  Clinical Data: Nausea. Hypertension.  PORTABLE CHEST - 1 VIEW  Comparison: 12/20/2011.  Findings: Borderline enlarged cardiac silhouette and mildly  prominent pulmonary vasculature, accentuated by the poor  inspiration and the portable AP technique. Clear lungs. Diffuse  osteopenia.  IMPRESSION:  No acute abnormality.  Original Report Authenticated By: Beckie Salts, M.D.   EKG:  NSR with complete heart block and frequent multifocal PVC's   ASSESSMENT:  1.  Cardiac arrest secondary to sustained ventricular tachycardia s/p defib 2.  Ventricular tachcyardia - sustained and  strips appear polymorphic c/w torsades which may be due to severe bradycardia 3.   Complete heart block - conduction system disease vs. Ischemia 4.  HTN with elevated BP 5.  N/V secondary to #3 +/- ischemia 6.  Hypercalcemia ?  etiology  PLAN:   1.  Admit to CCU 2.  Cycle cardiac enzymes 3.  Zoll pads  4.  Magnesium 2gm IV 5.  Emergent temporary pacer wire and cardiac cath to rule out CAD 6.  Cardiac catheterization and placement of temporary pacer wire were discussed with the patient fully including risks on myocardial infarction, death, stroke, bleeding, arrhythmia, dye allergy, renal insufficiency, infection, pericardial tamponade or bleeding.  All patient questions and concerns were discussed and the patient understands and is willing to proceed.    Quintella Reichert, MD  07/29/2012  4:59 PM

## 2012-07-30 ENCOUNTER — Encounter (HOSPITAL_COMMUNITY): Payer: Self-pay

## 2012-07-30 DIAGNOSIS — I10 Essential (primary) hypertension: Secondary | ICD-10-CM | POA: Diagnosis not present

## 2012-07-30 DIAGNOSIS — I472 Ventricular tachycardia: Secondary | ICD-10-CM | POA: Diagnosis not present

## 2012-07-30 DIAGNOSIS — I469 Cardiac arrest, cause unspecified: Secondary | ICD-10-CM | POA: Diagnosis not present

## 2012-07-30 DIAGNOSIS — I442 Atrioventricular block, complete: Secondary | ICD-10-CM | POA: Diagnosis not present

## 2012-07-30 LAB — SURGICAL PCR SCREEN
MRSA, PCR: NEGATIVE
Staphylococcus aureus: NEGATIVE

## 2012-07-30 LAB — TROPONIN I: Troponin I: <0.3 ng/mL (ref <0.30)

## 2012-07-30 MED ORDER — ALPRAZOLAM 0.25 MG PO TABS
0.2500 mg | ORAL_TABLET | Freq: Two times a day (BID) | ORAL | Status: DC | PRN
  Administered 2012-07-30 – 2012-08-01 (×4): 0.25 mg via ORAL
  Filled 2012-07-30 (×4): qty 1

## 2012-07-30 MED ORDER — HYDROCODONE-ACETAMINOPHEN 5-325 MG PO TABS
2.0000 | ORAL_TABLET | Freq: Three times a day (TID) | ORAL | Status: DC | PRN
  Administered 2012-07-30 – 2012-08-01 (×5): 2 via ORAL
  Filled 2012-07-30 (×5): qty 2

## 2012-07-30 MED ORDER — WHITE PETROLATUM GEL
Status: AC
  Administered 2012-07-30: 0.2
  Filled 2012-07-30: qty 5

## 2012-07-30 MED ORDER — ZOLPIDEM TARTRATE 5 MG PO TABS
5.0000 mg | ORAL_TABLET | Freq: Every evening | ORAL | Status: DC | PRN
  Administered 2012-07-31: 5 mg via ORAL
  Filled 2012-07-30: qty 1

## 2012-07-30 MED ORDER — SODIUM CHLORIDE 0.9 % IR SOLN
80.0000 mg | Status: DC
  Filled 2012-07-30: qty 2

## 2012-07-30 MED ORDER — PNEUMOCOCCAL VAC POLYVALENT 25 MCG/0.5ML IJ INJ
0.5000 mL | INJECTION | INTRAMUSCULAR | Status: AC
  Administered 2012-07-31: 0.5 mL via INTRAMUSCULAR
  Filled 2012-07-30: qty 0.5

## 2012-07-30 MED ORDER — SODIUM CHLORIDE 0.9 % IV SOLN
INTRAVENOUS | Status: DC
  Administered 2012-07-30: 09:00:00 via INTRAVENOUS
  Administered 2012-07-30: 20 mL/h via INTRAVENOUS
  Administered 2012-07-31: 50 mL/h via INTRAVENOUS

## 2012-07-30 NOTE — Progress Notes (Addendum)
SUBJECTIVE:  No further polymorphic VT s/p temp pacer insertion  OBJECTIVE:   Vitals:   Filed Vitals:   07/30/12 0300 07/30/12 0400 07/30/12 0500 07/30/12 0600  BP: 109/44 132/60 136/63 151/61  Pulse:      Temp:  98 F (36.7 C)    TempSrc:      Resp: 20 15 14 21   Height:      Weight:      SpO2:  96%     I&O's:   Intake/Output Summary (Last 24 hours) at 07/30/12 7829 Last data filed at 07/30/12 0700  Gross per 24 hour  Intake 2432.13 ml  Output    975 ml  Net 1457.13 ml   TELEMETRY: Reviewed telemetry pt in V paced at 80bpm     PHYSICAL EXAM General: Well developed, well nourished, in no acute distress Head: Eyes PERRLA, No xanthomas.   Normal cephalic and atramatic  Lungs:   Clear bilaterally to auscultation and percussion. Heart:   HRRR S1 S2 Pulses are 2+ & equal. Abdomen: Bowel sounds are positive, abdomen soft and non-tender without masses Extremities:   No clubbing, cyanosis or edema.  DP +1 Neuro: Alert and oriented X 3. Psych:  Good affect, responds appropriately   LABS: Basic Metabolic Panel:  Recent Labs  56/21/30 1356 07/29/12 1455 07/29/12 1704  NA 138  --  137  K 4.4  --  4.3  CL 98  --  101  CO2 25  --  22  GLUCOSE 160*  --  130*  BUN 53*  --  52*  CREATININE 1.20*  --  1.16*  CALCIUM 11.1*  --  10.8*  MG  --  1.9 2.0   Liver Function Tests:  Recent Labs  07/29/12 1704  AST 24  ALT 38*  ALKPHOS 76  BILITOT 0.2*  PROT 7.3  ALBUMIN 3.5   No results found for this basename: LIPASE, AMYLASE,  in the last 72 hours CBC:  Recent Labs  07/29/12 1356 07/29/12 1704  WBC 10.6* 11.7*  NEUTROABS 8.9* 9.0*  HGB 11.4* 11.5*  HCT 33.6* 33.1*  MCV 91.3 88.3  PLT 385 313   Cardiac Enzymes:  Recent Labs  07/29/12 1356 07/29/12 1704 07/29/12 2241  TROPONINI <0.30 <0.30 <0.30   Coag Panel:   Lab Results  Component Value Date   INR 1.09 07/29/2012   INR 1.44 12/28/2011   INR 1.47 12/27/2011    RADIOLOGY: Dg Chest Port 1  View  07/29/2012  *RADIOLOGY REPORT*  Clinical Data: Nausea.  Hypertension.  PORTABLE CHEST - 1 VIEW  Comparison: 12/20/2011.  Findings: Borderline enlarged cardiac silhouette and mildly prominent pulmonary vasculature, accentuated by the poor inspiration and the portable AP technique.  Clear lungs.  Diffuse osteopenia.  IMPRESSION: No acute abnormality.   Original Report Authenticated By: Beckie Salts, M.D.    ASSESSMENT:  1. Cardiac arrest secondary to sustained ventricular tachycardia s/p defib  2. Ventricular tachcyardia - sustained and strips appear polymorphic c/w torsades secondary to severe bradycardia  3. Complete heart block s/p temp pacer insertion 4. HTN BP improved 5. N/V secondary to #3 - resolved 6. Hypercalcemia ? etiology  7. Normal coronary arteries by cath  PLAN:  1.  Continue temp pacer at 80bpm 2.  NPO after midnight 3.  PPM in am    Quintella Reichert, MD  07/30/2012  7:42 AM

## 2012-07-30 NOTE — Progress Notes (Signed)
Utilization review completed.  

## 2012-07-31 ENCOUNTER — Encounter (HOSPITAL_COMMUNITY)
Admission: EM | Disposition: A | Discharge status: Home / Self Care | Payer: Self-pay | Source: Home / Self Care | Attending: Cardiology

## 2012-07-31 ENCOUNTER — Inpatient Hospital Stay (HOSPITAL_COMMUNITY): Payer: Medicare Other

## 2012-07-31 DIAGNOSIS — I442 Atrioventricular block, complete: Secondary | ICD-10-CM

## 2012-07-31 DIAGNOSIS — Z950 Presence of cardiac pacemaker: Secondary | ICD-10-CM | POA: Diagnosis not present

## 2012-07-31 DIAGNOSIS — I059 Rheumatic mitral valve disease, unspecified: Secondary | ICD-10-CM

## 2012-07-31 HISTORY — PX: PERMANENT PACEMAKER INSERTION: SHX5480

## 2012-07-31 SURGERY — PERMANENT PACEMAKER INSERTION
Anesthesia: LOCAL

## 2012-07-31 MED ORDER — ONDANSETRON HCL 4 MG/2ML IJ SOLN: 4.0000 mg | Freq: Four times a day (QID) | INTRAMUSCULAR | Status: DC | PRN

## 2012-07-31 MED ORDER — ACETAMINOPHEN 325 MG PO TABS: 325.0000 mg | ORAL_TABLET | ORAL | Status: DC | PRN

## 2012-07-31 MED ORDER — DEXTROSE 5 % IV SOLN
3.0000 g | INTRAVENOUS | Status: DC
  Filled 2012-07-31: qty 3000

## 2012-07-31 MED ORDER — HEPARIN (PORCINE) IN NACL 2-0.9 UNIT/ML-% IJ SOLN
INTRAMUSCULAR | Status: AC
  Filled 2012-07-31: qty 500

## 2012-07-31 MED ORDER — MIDAZOLAM HCL 5 MG/5ML IJ SOLN
INTRAMUSCULAR | Status: AC
  Filled 2012-07-31: qty 5

## 2012-07-31 MED ORDER — SODIUM CHLORIDE 0.9 % IV SOLN
INTRAVENOUS | Status: DC
  Administered 2012-07-31: 20 mL/h via INTRAVENOUS

## 2012-07-31 MED ORDER — FENTANYL CITRATE 0.05 MG/ML IJ SOLN
INTRAMUSCULAR | Status: AC
  Filled 2012-07-31: qty 2

## 2012-07-31 MED ORDER — SODIUM CHLORIDE 0.9 % IV SOLN: INTRAVENOUS | Status: AC

## 2012-07-31 MED ORDER — DIAZEPAM 5 MG PO TABS
5.0000 mg | ORAL_TABLET | ORAL | Status: AC
  Administered 2012-07-31: 5 mg via ORAL
  Filled 2012-07-31 (×2): qty 1

## 2012-07-31 MED ORDER — CEFAZOLIN SODIUM 1-5 GM-% IV SOLN
1.0000 g | Freq: Four times a day (QID) | INTRAVENOUS | Status: AC
  Administered 2012-07-31 – 2012-08-01 (×3): 1 g via INTRAVENOUS
  Filled 2012-07-31 (×3): qty 50

## 2012-07-31 MED ORDER — LIDOCAINE HCL (PF) 1 % IJ SOLN
INTRAMUSCULAR | Status: AC
  Filled 2012-07-31: qty 60

## 2012-07-31 MED FILL — Lidocaine HCl Local Preservative Free (PF) Inj 1%: INTRAMUSCULAR | Qty: 2 | Status: AC

## 2012-07-31 NOTE — Consult Note (Signed)
ELECTROPHYSIOLOGY CONSULT NOTE  Patient ID: Shelley Black, MRN: [id number], DOB/AGE: 05-07-1930 79 y.o. Admit date: 07/29/2012 Date of Consult: 07/31/2012  Primary Physician: Denny Levy, MD Primary Cardiologist: TT  Chief Complaint:  somplete heart block   HPI Shelley Black is a 79 y.o. female  admitted over the weekend after she was found to be in complete heart block. She had presented with nausea vomiting and dizziness that had been progressive for a couple of days. Symptoms worsened. She also a headache neck pain which she had attributed to a fall. This pain had worsened over the days.  Upon arrival to the outside hospital she was found to have bradycardia with complete heart block. She also had episodes of nonsustained ventricular tachycardia for which amiodarone was started. In route from there to [facility] she ended up for sustained polymorphic ventricular tachycardia that was rated cardiac mediated. This required a shock. A transvenous femoral pacemaker was placed. There have been no further episodes of polymorphic VT.   evaluation over the weekend is included normal electrolytes, no evidence of myocardial infarction. Echocardiogram is pending at this time.  She has no history of heart disease. She denies exertional chest pain or shortness of breath prior to the last few weeks. She has had no edema.   Past Medical History  Diagnosis Date  . Heart murmur     not seen by cardiologist  . Hypertension   . Arthritis       Surgical History:  Past Surgical History  Procedure Laterality Date  . Joint replacement      Left knee  . Thyroid surgery    . Appendectomy    . Eye surgery      cataract bil  . Total knee arthroplasty  12/24/2011    Procedure: TOTAL KNEE ARTHROPLASTY;  Surgeon: Harvie Junior, MD;  Location: [facility] OR;  Service: Orthopedics;  Laterality: Right;  general anesthesia with pre op femoral nerve block  . Rotator cuff repair Right      Home Meds: Prior to  Admission medications   Medication Sig Start Date End Date Taking? Authorizing Provider  Cholecalciferol (VITAMIN D3) 2000 UNITS TABS Take 1 tablet by mouth daily.   Yes Historical Provider, MD  hydrochlorothiazide (HYDRODIURIL) 25 MG tablet Take 1 tablet (25 mg total) by mouth daily. 06/26/12  Yes Nestor Ramp, MD  HYDROcodone-acetaminophen (NORCO/VICODIN) 5-325 MG per tablet Take one or two tablets by mouth every 8 hours as needed for pain 05/18/12  Yes Nestor Ramp, MD  Multiple Vitamin (MULTIVITAMIN WITH MINERALS) TABS Take 1 tablet by mouth daily.   Yes Historical Provider, MD  Omega-3 Fatty Acids (FISH OIL TRIPLE STRENGTH PO) Take 1 capsule by mouth daily.   Yes Historical Provider, MD  oxyCODONE (OXYCONTIN) 20 MG 12 hr tablet Take by mouth one or two tablets every 8-12 hours prn pain Do not take with the vicodin 07/28/12  Yes Nestor Ramp, MD  pravastatin (PRAVACHOL) 40 MG tablet Take 1 tablet (40 mg total) by mouth daily. 05/01/12  Yes Nestor Ramp, MD  QUEtiapine (SEROQUEL) 25 MG tablet Take 1 tablet (25 mg total) by mouth at bedtime. 05/18/12  Yes Nestor Ramp, MD  ramipril (ALTACE) 10 MG capsule Take 10 mg by mouth 2 (two) times daily.   Yes Historical Provider, MD    Inpatient Medications:  . aspirin EC  81 mg Oral Daily  .  ceFAZolin (ANCEF) IV  3 g Intravenous On Call  .  diazepam  5 mg Oral On Call  . gentamicin irrigation  80 mg Irrigation On Call  . heparin  5,000 Units Subcutaneous Q8H  . pneumococcal 23 valent vaccine  0.5 mL Intramuscular Tomorrow-1000    Allergies: No Known Allergies  History   Social History  . Marital Status: Married    Spouse Name: N/A    Number of Children: N/A  . Years of Education: N/A   Occupational History  . Not on file.   Social History Main Topics  . Smoking status: Never Smoker   . Smokeless tobacco: Never Used  . Alcohol Use: No  . Drug Use: No  . Sexually Active: Not on file   Other Topics Concern  . Not on file   Social History  Narrative  . No narrative on file     History reviewed. No pertinent family history.   ROS:  Please see the history of present illness.     All other systems reviewed and negative.    Physical Exam:   Blood pressure 156/62, pulse 78, temperature 97.8 F (36.6 C), temperature source Oral, resp. rate 14, height 5\' 5"  (1.651 m), weight 147 lb (66.679 kg), SpO2 94.00%. General: Well developed, well nourished female in no acute distress. Head: Normocephalic, atraumatic, sclera non-icteric, no xanthomas, nares are without discharge. EENT: normal Lymph Nodes:  none Back:  Not examined as she has a femoral pacemaker in place  Neck: Negative for carotid bruits. JVD not elevated. Lungs: Clear bilaterally  Heart: RRR with S1 S2. No murmur , rubs, or gallops appreciated. Abdomen: Soft, non-tender, non-distended with normoactive bowel sounds. No hepatomegaly. No rebound/guarding. No obvious abdominal masses. Msk:  Strength and tone appear normal for age. Extremities: No clubbing or cyanosis. No   edema.  Distal pedal pulses are 2+ and equal bilaterally. Skin: Warm and Dry Neuro: Alert and oriented X 3. CN III-XII intact Grossly normal sensory and motor function . Psych:  Responds to questions appropriately with a normal affect.      Labs: Cardiac Enzymes  Recent Labs  07/29/12 1356 07/29/12 1704 07/29/12 2241 07/30/12 0633  TROPONINI <0.30 <0.30 <0.30 <0.30   CBC Lab Results  Component Value Date   WBC 11.7* 07/29/2012   HGB 11.5* 07/29/2012   HCT 33.1* 07/29/2012   MCV 88.3 07/29/2012   PLT 313 07/29/2012   PROTIME:  Recent Labs  07/29/12 1704  LABPROT 14.0  INR 1.09   Chemistry  Recent Labs Lab 07/29/12 1704  NA 137  K 4.3  CL 101  CO2 22  BUN 52*  CREATININE 1.16*  CALCIUM 10.8*  PROT 7.3  BILITOT 0.2*  ALKPHOS 76  ALT 38*  AST 24  GLUCOSE 130*   Lipids Lab Results  Component Value Date   CHOL 198 08/27/2011   HDL 51 08/27/2011   LDLCALC 93 08/27/2011    TRIG 269* 08/27/2011   BNP No results found for this basename: probnp   Miscellaneous No results found for this basename: DDIMER    Radiology/Studies:  Dg Chest Port 1 View  07/29/2012  *RADIOLOGY REPORT*  Clinical Data: Nausea.  Hypertension.  PORTABLE CHEST - 1 VIEW  Comparison: 12/20/2011.  Findings: Borderline enlarged cardiac silhouette and mildly prominent pulmonary vasculature, accentuated by the poor inspiration and the portable AP technique.  Clear lungs.  Diffuse osteopenia.  IMPRESSION: No acute abnormality.   Original Report Authenticated By: Beckie Salts, M.D.     EKG:  Ventricular pacing with underlying complete heart bock  Tel en route>> bradycardia assoc PM ventricular tachycardia  Assessment and Plan:   The patient has new onset complete heart block with bradycardia-dependent polymorphic ventricular tachycardia. The patient has been staying with trans venous pacing over the weekend. It is appropriate to proceed with permanent pacing. No reversible causes were identified. Specifically electrolytes were normal no evidence of myocardial infarction. We have no current assessment of left ventricular systolic function     Sherryl Manges

## 2012-07-31 NOTE — Progress Notes (Signed)
  Echocardiogram 2D Echocardiogram has been performed.  Shelley Black 07/31/2012, 8:19 AM

## 2012-07-31 NOTE — CV Procedure (Signed)
Preop DX::Complete heart block Post op DX:: same  Procedure dual pacemaker implantation  After routine prep and drape, lidocaine was infiltrated in the prepectoral subclavicular region on the left side an incision was made and carried down to later the prepectoral fascia using electrocautery and sharp dissection a pocket was formed similarly. Hemostasis was obtained.  After this, we turned our attention to gaining accessm to the extrathoracic,left subclavian vein. This was accomplished without difficulty and without the aspiration of air or puncture of the artery. 2 separate venipunctures were accomplished; guidewires were placed and retained and sequentially 7 French sheath through which were  passed an Medtronic 5076 ventricular lead serial [id number] and an Medtronic 5076 atrial lead serial number [id number] .  The ventricular lead was manipulated to the right ventricular apex with a bipolar R wave was >20 (paced) , the pacing impedance was 998, the threshold was 1.7 @ 0.5 msec  Current at threshold was   2.0  Ma and the current of injury was  brisk.  The right atrial lead was manipulated to the right atrial appendage  with a bipolar P-wave  2.7, the pacing impedance was 667, the threshold 1.5@ 0.5 msec   Current at threshold was 2.2  Ma and the current of injury was brisk.  The ventricular lead was marked with a tie prior to the insertion of the atrial lead. The leads were affixed to the prepectoral fascia and attached to a  Medtronic [id number] pulse generator serial number  As listed  Hemostasis was obtained. The pocket was copiously irrigated with antibiotic containing saline solution. The leads and the pulse generator were placed in the pocket and affixed to the prepectoral fascia. The wound was then closed in 2 layers in the normal fashion. The wound was washed dried and a benzoin Steri-Strip dressing was applied.  Needle  Count, sponge counts and instrument counts were correct at the  end of the procedure .   The patient tolerated the procedure without apparent complication.  Gerlene Burdock.D.

## 2012-07-31 NOTE — Progress Notes (Signed)
Right femoral sheath removed, pressure held for 15 minutes. No evidence of bleeding or swelling. Groin level 0. Instructed pt on bedrest for the next four hours, and to continue not moving leg during this bedrest time. Tolerated procedure well.

## 2012-08-01 DIAGNOSIS — I442 Atrioventricular block, complete: Secondary | ICD-10-CM | POA: Diagnosis not present

## 2012-08-01 NOTE — Progress Notes (Signed)
     Patient: Shelley Black Date of Encounter: 08/01/2012, 7:23 AM Admit date: 07/29/2012     Subjective  Shelley Black has no complaints. She denies CP or SOB.   Objective  Physical Exam: Vitals: BP 147/56  Pulse 67  Temp(Src) 98.5 F (36.9 C) (Oral)  Resp 10  Ht 5\' 5"  (1.651 m)  Wt 147 lb (66.679 kg)  BMI 24.46 kg/m2  SpO2 94% General: Well developed, well appearing 79 year old female in no acute distress. Neck: Supple. JVD not elevated. Lungs: Clear bilaterally to auscultation without wheezes, rales, or rhonchi. Breathing is unlabored. Heart: RRR S1 S2 without murmurs, rubs, or gallops.  Abdomen: Soft, non-distended. Extremities: No clubbing or cyanosis. No edema.  Distal pedal pulses are 2+ and equal bilaterally. Neuro: Alert and oriented X 3. Moves all extremities spontaneously. No focal deficits. Skin: Left upper chest/implant site intact without bleeding or hematoma.  Intake/Output:  Intake/Output Summary (Last 24 hours) at 08/01/12 0723 Last data filed at 08/01/12 0600  Gross per 24 hour  Intake 1599.17 ml  Output   2420 ml  Net -820.83 ml    Inpatient Medications:  . aspirin EC  81 mg Oral Daily    Labs:  Recent Labs  07/29/12 1356 07/29/12 1455 07/29/12 1704  NA 138  --  137  K 4.4  --  4.3  CL 98  --  101  CO2 25  --  22  GLUCOSE 160*  --  130*  BUN 53*  --  52*  CREATININE 1.20*  --  1.16*  CALCIUM 11.1*  --  10.8*  MG  --  1.9 2.0    Recent Labs  07/29/12 1704  AST 24  ALT 38*  ALKPHOS 76  BILITOT 0.2*  PROT 7.3  ALBUMIN 3.5    Recent Labs  07/29/12 1356 07/29/12 1704  WBC 10.6* 11.7*  NEUTROABS 8.9* 9.0*  HGB 11.4* 11.5*  HCT 33.6* 33.1*  MCV 91.3 88.3  PLT 385 313    Recent Labs  07/29/12 1356 07/29/12 1704 07/29/12 2241 07/30/12 0633  TROPONINI <0.30 <0.30 <0.30 <0.30    Recent Labs  07/29/12 2241  TSH 1.127    Recent Labs  07/29/12 1704  INR 1.09    Radiology/Studies: Dg Chest Portable 1  View  07/31/2012  *RADIOLOGY REPORT*  Clinical Data: Pacemaker insertion  PORTABLE CHEST - 1 VIEW  Comparison: 07/29/2012  Findings: Left-sided transvenous pacemaker is in place with leads in the right atrium and right ventricle.  No pneumothorax. Negative for heart failure or effusion.  IMPRESSION: Satisfactory dual lead transvenous pacemaker.   Original Report Authenticated By: Janeece Riggers, M.D.     Device interrogation: this AM shows normal PPM function; underlying rhythm CHB in the 30s Telemetry: A sensed V paced    Assessment and Plan  1. CHB with brady dep PMVT s/p PPM implant Shelley Black is doing well s/p PPM implant. Chest x-ray post procedure yesterday shows stable lead placement without PTX. Device interrogation this AM shows normal PPM function. Wound is intact without bleeding or hematoma. Wound care, activity restrictions and follow-up reviewed.   Signed, Aayra Hornbaker PA-C

## 2012-08-01 NOTE — Progress Notes (Signed)
Pt with underlying rhythm   Will plan on discharge today.   MAC present   Is this assoc with heart block   Will f/u wound clinic in10days SK 3 months

## 2012-08-01 NOTE — Discharge Summary (Signed)
ELECTROPHYSIOLOGY DISCHARGE SUMMARY    Patient ID: Shelley Black,  MRN: [id number], DOB/AGE: 77/08/1930 77 y.o.  Admit date: 07/29/2012 Discharge date: 08/01/2012  Primary Care Physician: Denny Levy, MD Primary Cardiologist: Berton Mount, MD  Primary Discharge Diagnosis:  1. Complete heart block s/p PPM implant 2. Brady-dependent PMVT  Secondary Discharge Diagnoses:  1. HTN 2. Dyslipidemia 3. OA  Procedures This Admission:  1. Cardiac catheterization and temporary transvenous pacing wire placement 07/29/2012 Procedural Findings:  Hemodynamics:  AO 172/72 with a mean of 110 mmHg  LV 174/19 mmHg  Coronary angiography:  Coronary dominance: right  Left mainstem: The left main coronary is moderately calcified. It has less than 10% irregularities.  Left anterior descending (LAD): The left anterior descending artery is moderately calcified in the proximal vessel. It has minor irregularities less than 10-20%.  Left circumflex (LCx): The left circumflex is a large vessel that gives rise to 2 marginal branches. It has mild ectasia and mild irregularities less than 20%.  Right coronary artery (RCA): The right coronary is a large dominant vessel. It has 20% disease in the proximal vessel. It is otherwise normal.  Left ventriculography: Left ventricular systolic function is normal, LVEF is estimated at 65%, there is no significant mitral regurgitation, the aortic is diffusely calcified.  Final Conclusions:  - Mild nonobstructive coronary disease.  - Normal left ventricular function.  - Successful insertion of a temporary transvenous pacemaker wire.  2. Dual chamber PPM implantation 07/31/2012 Atrial lead - Medtronic 5076 atrial lead serial number [id number] Ventricular lead - Medtronic 5076 ventricular lead serial [id number] Pacemaker - Medtronic [id number] H pulse generator  History: Please see admission H&P for full details. Shelley Black is a 79 year old woman with HTN and  dyslipidemia who was in her USOH until 2 AM on the day of admission when she awakened with nausea and vomiting. She got up, felt very dizzy, short of breath and experienced near syncope. Her family took her to [facility]. In the ED she was found to be bradycardic with a HR in the 20-30's with intermittent runs of nonsustained ventricular tachycardia. She was given a bolus of IV amiodarone and then transferred to [facility]. While in route to [facility] she had sustained VT with loss of consciousness and was defibrillated with 200J back to sinus with complete heart block. Labs at [facility] were normal with magnesium at 1.9.    Hospital Course:  She was admitted to [facility] on 07/29/2012 and underwent cardiac cath which revealed mild, nonobstructive CAD and normal LV function. In addition, she had a temporary transvenous pacing wire placed at that time. She remained in the CCU and on 07/31/2012 underwent dual chamber PPM implantation. Ms. Rothgeb tolerated this procedure well without any immediate complication. She remains hemodynamically stable and afebrile. Her chest xray shows stable lead placement without pneumothorax. Her device interrogation shows normal PPM function with stable lead parameters/measurements. Her implant site is intact without significant bleeding or hematoma. She has been given discharge instructions including wound care and activity restrictions. She will follow-up in 10 days for wound check. There were no changes made to her medications. She will not drive until given clearance by Dr. Graciela Husbands. She has been seen, examined and deemed stable for discharge today by Dr. Berton Mount.  Discharge Vitals: Blood pressure 177/82, pulse 70, temperature 98.4 F (36.9 C), temperature source Oral, resp. rate 14, height 5\' 5"  (1.651 m), weight 147 lb (66.679 kg), SpO2 96.00%.   Labs: Lab Results  Component  Value Date   WBC 11.7* 07/29/2012   HGB 11.5* 07/29/2012   HCT 33.1* 07/29/2012   MCV 88.3 07/29/2012   PLT  313 07/29/2012    Recent Labs Lab 07/29/12 1704  NA 137  K 4.3  CL 101  CO2 22  BUN 52*  CREATININE 1.16*  CALCIUM 10.8*  PROT 7.3  BILITOT 0.2*  ALKPHOS 76  ALT 38*  AST 24  GLUCOSE 130*   Lab Results  Component Value Date   TROPONINI <0.30 07/30/2012    Recent Labs  07/29/12 1704  INR 1.09    Disposition:  The patient is being discharged in stable condition.  Follow-up: Follow-up Information   Follow up with [facility] Device 1 On 08/10/2012. (At 12:00 noon for wound check)    Contact information:   [address] [place] [address] [phone]      Follow up with Sherryl Manges, MD In 3 months. (For pacemaker follow-up; Our office will mail a reminder letter)    Contact information:   [address] [place] [address] [phone]     Discharge Medications:    Medication List    TAKE these medications       FISH OIL TRIPLE STRENGTH PO  Take 1 capsule by mouth daily.     hydrochlorothiazide 25 MG tablet  Commonly known as:  HYDRODIURIL  Take 1 tablet (25 mg total) by mouth daily.     HYDROcodone-acetaminophen 5-325 MG per tablet  Commonly known as:  NORCO/VICODIN  Take one or two tablets by mouth every 8 hours as needed for pain     multivitamin with minerals Tabs  Take 1 tablet by mouth daily.     oxyCODONE 20 MG 12 hr tablet  Commonly known as:  OXYCONTIN  Take by mouth one or two tablets every 8-12 hours prn pain Do not take with the vicodin     pravastatin 40 MG tablet  Commonly known as:  PRAVACHOL  Take 1 tablet (40 mg total) by mouth daily.     QUEtiapine 25 MG tablet  Commonly known as:  SEROQUEL  Take 1 tablet (25 mg total) by mouth at bedtime.     ramipril 10 MG capsule  Commonly known as:  ALTACE  Take 10 mg by mouth 2 (two) times daily.     Vitamin D3 2000 UNITS Tabs  Take 1 tablet by mouth daily.       Duration of Discharge Encounter: Greater than 30 minutes including physician  time.  Signed, Rick Duff, PA-C 08/01/2012, 9:26 AM

## 2012-08-02 ENCOUNTER — Telehealth: Payer: Self-pay | Admitting: Internal Medicine

## 2012-08-02 NOTE — Telephone Encounter (Signed)
**  TCM** Patient states she is doing well and is taking all medications.  Patient denies c/o chest pain and SOB. Patient aware of appointment on 4/10 and states she will call us if she has any problems or concerns.

## 2012-08-10 ENCOUNTER — Other Ambulatory Visit: Payer: Self-pay

## 2012-08-10 ENCOUNTER — Ambulatory Visit (INDEPENDENT_AMBULATORY_CARE_PROVIDER_SITE_OTHER): Payer: Medicare Other | Admitting: *Deleted

## 2012-08-10 DIAGNOSIS — I442 Atrioventricular block, complete: Secondary | ICD-10-CM

## 2012-08-10 LAB — PACEMAKER DEVICE OBSERVATION
AL THRESHOLD: 0.5 V
ATRIAL PACING PM: 1
RV LEAD AMPLITUDE: 15.68 mv
RV LEAD THRESHOLD: 0.75 V

## 2012-08-10 NOTE — Progress Notes (Signed)
Wound check-PPM 

## 2012-08-16 ENCOUNTER — Telehealth: Payer: Self-pay | Admitting: Internal Medicine

## 2012-08-16 ENCOUNTER — Other Ambulatory Visit: Payer: Self-pay | Admitting: Family Medicine

## 2012-08-16 NOTE — Telephone Encounter (Signed)
New problem   Visiting daughter in [place].    husband calling   S/p pacemaker   C/O  Nausea.   PCP was not contacted. No ER visit while in [place].

## 2012-08-16 NOTE — Telephone Encounter (Signed)
Spoke with pt, they recently went to [place] to visit family. Several people had the flu with vomiting and diarrhea. She has had her pacer in place for about 3 weeks now and wants to make sure if she gets sick with vomiting it will not bother the pacer. Reassurance given to pt that the device will be fine.

## 2012-08-21 ENCOUNTER — Telehealth: Payer: Self-pay | Admitting: Internal Medicine

## 2012-08-21 NOTE — Telephone Encounter (Signed)
I spoke with Shelley Black. Per Dr. Graciela Husbands, no dental work for 12 weeks post procedure is possible. The patient's implant was on 07/29/12.

## 2012-08-21 NOTE — Telephone Encounter (Signed)
New pROB     Requesting a list of Do's and Don't for recent pacemaker implant in relation to dental work. Would like to speak to nurse regarding this.

## 2012-09-13 ENCOUNTER — Other Ambulatory Visit: Payer: Self-pay | Admitting: Family Medicine

## 2012-09-27 ENCOUNTER — Other Ambulatory Visit: Payer: Medicare Other

## 2012-09-27 DIAGNOSIS — E785 Hyperlipidemia, unspecified: Secondary | ICD-10-CM | POA: Diagnosis not present

## 2012-09-27 DIAGNOSIS — I10 Essential (primary) hypertension: Secondary | ICD-10-CM

## 2012-09-27 LAB — COMPREHENSIVE METABOLIC PANEL
ALT: 15 U/L (ref 0–35)
Alkaline Phosphatase: 52 U/L (ref 39–117)
Creat: 0.92 mg/dL (ref 0.50–1.10)
Glucose, Bld: 85 mg/dL (ref 70–99)
Sodium: 142 mEq/L (ref 135–145)
Total Bilirubin: 0.2 mg/dL — ABNORMAL LOW (ref 0.3–1.2)
Total Protein: 6.9 g/dL (ref 6.0–8.3)

## 2012-09-27 LAB — LIPID PANEL
Cholesterol: 184 mg/dL (ref 0–200)
LDL Cholesterol: 87 mg/dL (ref 0–99)
Total CHOL/HDL Ratio: 3.9 Ratio
Triglycerides: 252 mg/dL — ABNORMAL HIGH (ref <150)
VLDL: 50 mg/dL — ABNORMAL HIGH (ref 0–40)

## 2012-09-27 NOTE — Progress Notes (Signed)
Drew CMET [facility] FLP Dewitt Hoes, MLS

## 2012-09-28 ENCOUNTER — Other Ambulatory Visit: Payer: Medicare Other

## 2012-10-02 ENCOUNTER — Encounter: Payer: Self-pay | Admitting: Family Medicine

## 2012-10-11 ENCOUNTER — Other Ambulatory Visit: Payer: Self-pay | Admitting: Family Medicine

## 2012-10-23 ENCOUNTER — Ambulatory Visit (INDEPENDENT_AMBULATORY_CARE_PROVIDER_SITE_OTHER): Payer: Medicare Other | Admitting: Family Medicine

## 2012-10-23 VITALS — BP 164/70 | Ht 65.0 in | Wt 140.0 lb

## 2012-10-23 DIAGNOSIS — E049 Nontoxic goiter, unspecified: Secondary | ICD-10-CM | POA: Diagnosis not present

## 2012-10-23 DIAGNOSIS — I10 Essential (primary) hypertension: Secondary | ICD-10-CM

## 2012-10-23 DIAGNOSIS — R42 Dizziness and giddiness: Secondary | ICD-10-CM | POA: Diagnosis not present

## 2012-10-23 DIAGNOSIS — E785 Hyperlipidemia, unspecified: Secondary | ICD-10-CM

## 2012-10-23 LAB — CBC WITH DIFFERENTIAL/PLATELET
Eosinophils Absolute: 0.4 10*3/uL (ref 0.0–0.7)
Eosinophils Relative: 4 % (ref 0–5)
HCT: 35.2 % — ABNORMAL LOW (ref 36.0–46.0)
Hemoglobin: 11.6 g/dL — ABNORMAL LOW (ref 12.0–15.0)
Lymphocytes Relative: 30 % (ref 12–46)
Lymphs Abs: 2.7 10*3/uL (ref 0.7–4.0)
MCH: 29.4 pg (ref 26.0–34.0)
MCV: 89.1 fL (ref 78.0–100.0)
Monocytes Absolute: 0.7 10*3/uL (ref 0.1–1.0)
Monocytes Relative: 7 % (ref 3–12)
RBC: 3.95 MIL/uL (ref 3.87–5.11)

## 2012-10-23 NOTE — Patient Instructions (Addendum)
Decrease the Seroquel to one half tab at night. I will send you a note about the blood work. If you are not having some improvement then I probably need to see you back in clinic in a week or 2.

## 2012-10-24 LAB — COMPREHENSIVE METABOLIC PANEL
AST: 19 U/L (ref 0–37)
Albumin: 3.9 g/dL (ref 3.5–5.2)
Alkaline Phosphatase: 57 U/L (ref 39–117)
BUN: 42 mg/dL — ABNORMAL HIGH (ref 6–23)
Potassium: 4.5 mEq/L (ref 3.5–5.3)
Total Bilirubin: 0.2 mg/dL — ABNORMAL LOW (ref 0.3–1.2)

## 2012-10-24 MED ORDER — QUETIAPINE FUMARATE 25 MG PO TABS: ORAL_TABLET | ORAL | Status: DC

## 2012-10-24 NOTE — Progress Notes (Signed)
  Subjective:    Patient ID: Shelley Black, female    DOB: 04-Jul-1930, 79 y.o.   MRN: [id number]  HPI Patient complained of some dizziness. Intermittent. She's little concern her pacemaker may not be working as this was one of the symptoms she had before she was diagnosed with complete heart block. She's had no nausea. No chest pain. No lower extremity swelling.   Review of Systems Denies fever, sweats, chills. Additionally see history of present illness above.    Objective:   Physical Exam  Vital signs are reviewed GENERAL: Well-developed female no acute distress CARDIOVASCULAR: Regular rate and rhythm no murmur gallop or rub neck is without bruits. NEURO: No gross focal neurological deficits. She has normal gait. She can rise to a chair and easily get up onto the examination table without assistance. Her gait is normal.      Assessment & Plan:  Dizziness. We did an EKG today which showed a paced rhythm so her pacers obviously working. He is a bit concerning now check some blood work and get back with her. She's not having improvement in symptoms I'll see her back in the family practice clinic within the next 2 weeks. She has new or worsening symptoms she'll either let him know immediately or proceed to the emergency department. In the arm I would also have her decrease the amount of Seroquel she's been taking. We started that several years ago. I think this will have her take half a tab at night. She that makes a difference. If not would consider discontinuing that and trying an alternative medication.

## 2012-10-30 ENCOUNTER — Encounter: Payer: Self-pay | Admitting: Family Medicine

## 2012-11-13 ENCOUNTER — Encounter: Payer: Self-pay | Admitting: *Deleted

## 2012-11-14 ENCOUNTER — Other Ambulatory Visit: Payer: Self-pay | Admitting: *Deleted

## 2012-11-14 ENCOUNTER — Ambulatory Visit (INDEPENDENT_AMBULATORY_CARE_PROVIDER_SITE_OTHER): Payer: Medicare Other | Admitting: Internal Medicine

## 2012-11-14 ENCOUNTER — Encounter: Payer: Self-pay | Admitting: Internal Medicine

## 2012-11-14 VITALS — BP 186/51 | HR 65 | Ht 65.0 in | Wt 144.8 lb

## 2012-11-14 DIAGNOSIS — Z950 Presence of cardiac pacemaker: Secondary | ICD-10-CM | POA: Diagnosis not present

## 2012-11-14 DIAGNOSIS — I442 Atrioventricular block, complete: Secondary | ICD-10-CM | POA: Diagnosis not present

## 2012-11-14 DIAGNOSIS — I10 Essential (primary) hypertension: Secondary | ICD-10-CM | POA: Diagnosis not present

## 2012-11-14 LAB — PACEMAKER DEVICE OBSERVATION
BAMS-0001: 150 {beats}/min
BATTERY VOLTAGE: 2.79 V
VENTRICULAR PACING PM: 100

## 2012-11-14 MED ORDER — AMLODIPINE BESYLATE 5 MG PO TABS: 5.0000 mg | ORAL_TABLET | Freq: Every day | ORAL | Status: DC

## 2012-11-14 MED ORDER — HYDROCODONE-ACETAMINOPHEN 5-325 MG PO TABS: ORAL_TABLET | ORAL | Status: DC

## 2012-11-14 NOTE — Assessment & Plan Note (Signed)
The patient's device was interrogated and the information was fully reviewed.  The device was reprogrammed to  Maximize battery longevity

## 2012-11-14 NOTE — Assessment & Plan Note (Signed)
Stable post pacing 

## 2012-11-14 NOTE — Patient Instructions (Addendum)
Remote monitoring is used to monitor your Pacemaker of ICD from home. This monitoring reduces the number of office visits required to check your device to one time per year. It allows [place] to keep an eye on the functioning of your device to ensure it is working properly. You are scheduled for a device check from home on 02/19/13. You may send your transmission at any time that day. If you have a wireless device, the transmission will be sent automatically. After your physician reviews your transmission, you will receive a postcard with your next transmission date.  Your physician wants you to follow-up in: March 2015 with Dr. Graciela Husbands. You will receive a reminder letter in the mail two months in advance. If you don't receive a letter, please call our office to schedule the follow-up appointment.  Your physician has recommended you make the following change in your medication:  1) Start norvasc (amlodipine) 5 mg once daily.

## 2012-11-14 NOTE — Telephone Encounter (Signed)
Call in Vicodin per MD.  Radene Ou, CMA

## 2012-11-14 NOTE — Progress Notes (Signed)
Patient Care Team: Nestor Ramp, MD as PCP - General   HPI  Shelley Black is a 79 y.o. female Seen following pacemaker implantation 3/14 for complete heart block that was complicated by pause dependent polymorphic ventricular tachycardia     noi recurrent syncope  Past Medical History  Diagnosis Date  . Heart murmur     not seen by cardiologist  . Hypertension   . Arthritis     Past Surgical History  Procedure Laterality Date  . Joint replacement      Left knee  . Thyroid surgery    . Appendectomy    . Eye surgery      cataract bil  . Total knee arthroplasty  12/24/2011    Procedure: TOTAL KNEE ARTHROPLASTY;  Surgeon: Harvie Junior, MD;  Location: [facility] OR;  Service: Orthopedics;  Laterality: Right;  general anesthesia with pre op femoral nerve block  . Rotator cuff repair Right     Current Outpatient Prescriptions  Medication Sig Dispense Refill  . Cholecalciferol (VITAMIN D3) 2000 UNITS TABS Take 1 tablet by mouth daily.      . hydrochlorothiazide (HYDRODIURIL) 25 MG tablet Take 1 tablet (25 mg total) by mouth daily.  90 tablet  3  . HYDROcodone-acetaminophen (NORCO/VICODIN) 5-325 MG per tablet Take one or two tablets by mouth every 8 hours as needed for pain  180 tablet  5  . Multiple Vitamin (MULTIVITAMIN WITH MINERALS) TABS Take 1 tablet by mouth daily.      . Omega-3 Fatty Acids (FISH OIL TRIPLE STRENGTH PO) Take 1 capsule by mouth daily.      . pravastatin (PRAVACHOL) 40 MG tablet Take 1 tablet (40 mg total) by mouth daily.  90 tablet  3  . QUEtiapine (SEROQUEL) 25 MG tablet Take one half tab at bedtime  30 tablet  12  . ramipril (ALTACE) 10 MG capsule TAKE ONE CAPSULE BY MOUTH TWICE DAILY  180 capsule  3   No current facility-administered medications for this visit.    No Known Allergies  Review of Systems negative except from HPI and PMH  Physical Exam BP 186/51  Pulse 65  Ht 5\' 5"  (1.651 m)  Wt 144 lb 12.8 oz (65.681 kg)  BMI 24.1 kg/m2 Well  developed and well nourished in no acute distress HENT normal E scleral and icterus clear Neck Supple JVP flat; carotids brisk and full Clear to ausculation Device pocket well healed; without hematoma or erythema Regular rate and rhythm, no murmurs gallops or rub Soft with active bowel sounds No clubbing cyanosis none Edema Alert and oriented, grossly normal motor and sensory function Skin Warm and Dry  ECG demonstrates P. Synchronous pacing  A rate of 65 Assessment and  Plan

## 2012-11-14 NOTE — Assessment & Plan Note (Signed)
Poorly controlled hypertension. We will add amlodipine 5 mg followup with her PCP in the next 3-4 weeks we have reviewed side effects

## 2012-11-17 ENCOUNTER — Encounter: Payer: Self-pay | Admitting: Internal Medicine

## 2012-11-24 ENCOUNTER — Other Ambulatory Visit: Payer: Self-pay | Admitting: Family Medicine

## 2012-11-24 MED ORDER — HYDROCODONE-ACETAMINOPHEN 5-325 MG PO TABS: ORAL_TABLET | ORAL | Status: DC

## 2012-12-11 ENCOUNTER — Other Ambulatory Visit: Payer: Self-pay | Admitting: Family Medicine

## 2012-12-12 NOTE — Telephone Encounter (Signed)
Dear White Team Please call in as below THANKS! Hugo Lybrand  

## 2012-12-12 NOTE — Telephone Encounter (Signed)
Refill called in to [facility] in [place], [phone].Almeda Ezra, Rodena Medin

## 2012-12-15 ENCOUNTER — Ambulatory Visit (INDEPENDENT_AMBULATORY_CARE_PROVIDER_SITE_OTHER): Payer: Medicare Other | Admitting: Family Medicine

## 2012-12-15 VITALS — BP 132/60 | Ht 65.0 in | Wt 145.0 lb

## 2012-12-15 DIAGNOSIS — I442 Atrioventricular block, complete: Secondary | ICD-10-CM

## 2012-12-15 DIAGNOSIS — R42 Dizziness and giddiness: Secondary | ICD-10-CM

## 2012-12-15 DIAGNOSIS — I10 Essential (primary) hypertension: Secondary | ICD-10-CM | POA: Diagnosis not present

## 2012-12-19 NOTE — Progress Notes (Signed)
  Subjective:    Patient ID: Shelley Black, female    DOB: [birth year], 79 y.o.   MRN: [id number]  HPI #1.followup bradycardia. Pacemaker she's been doing well. She just saw her cardiologist. #2. Followup dizziness. Decreased Seroquel well 2 half tab at night. She continues to sleep well, she is not having any racing of thoughts and the dizziness is resolved. #3. Followup blood pressure. She was seen by the cardiologist and they placed her on amlodipine 10 mg. She's having no problems with that. She's not having any ankle swelling. Her cardiologist told her to double check with her PCP.   Review of Systems Denies chest pain, denies lower strandy edema. No shortness of breath. Dizziness is resolved.    Objective:   Physical Exam  Vital signs are reviewed GENERAL: Well-developed female no acute distress EXTREMITY: Lower extremity without any edema. CEDIOVASCULAR regular rate and rhythm. No murmur. Slow rate. LUNGS: Clear to auscultation without rales.      Assessment & Plan:  #1. Bradycardia  And heart block resolved with pacer  #2 dizziness resolved with decrease in Seroquel. #3. Bradycardia and hypertension is doing well now with her current medication regimen and her pacemaker.

## 2013-02-03 DIAGNOSIS — Z23 Encounter for immunization: Secondary | ICD-10-CM | POA: Diagnosis not present

## 2013-02-06 IMAGING — CR DG CHEST 2V
2 series · 2 of 2 positions shown · non-contrast
Comparison: Two-view chest x-ray 12/23/2009.

CLINICAL DATA: Preoperative respiratory evaluation prior to right
total knee arthroplasty.  History of hypertension and heart murmur.

CHEST - 2 VIEW

[view not recorded (1 of 2)]
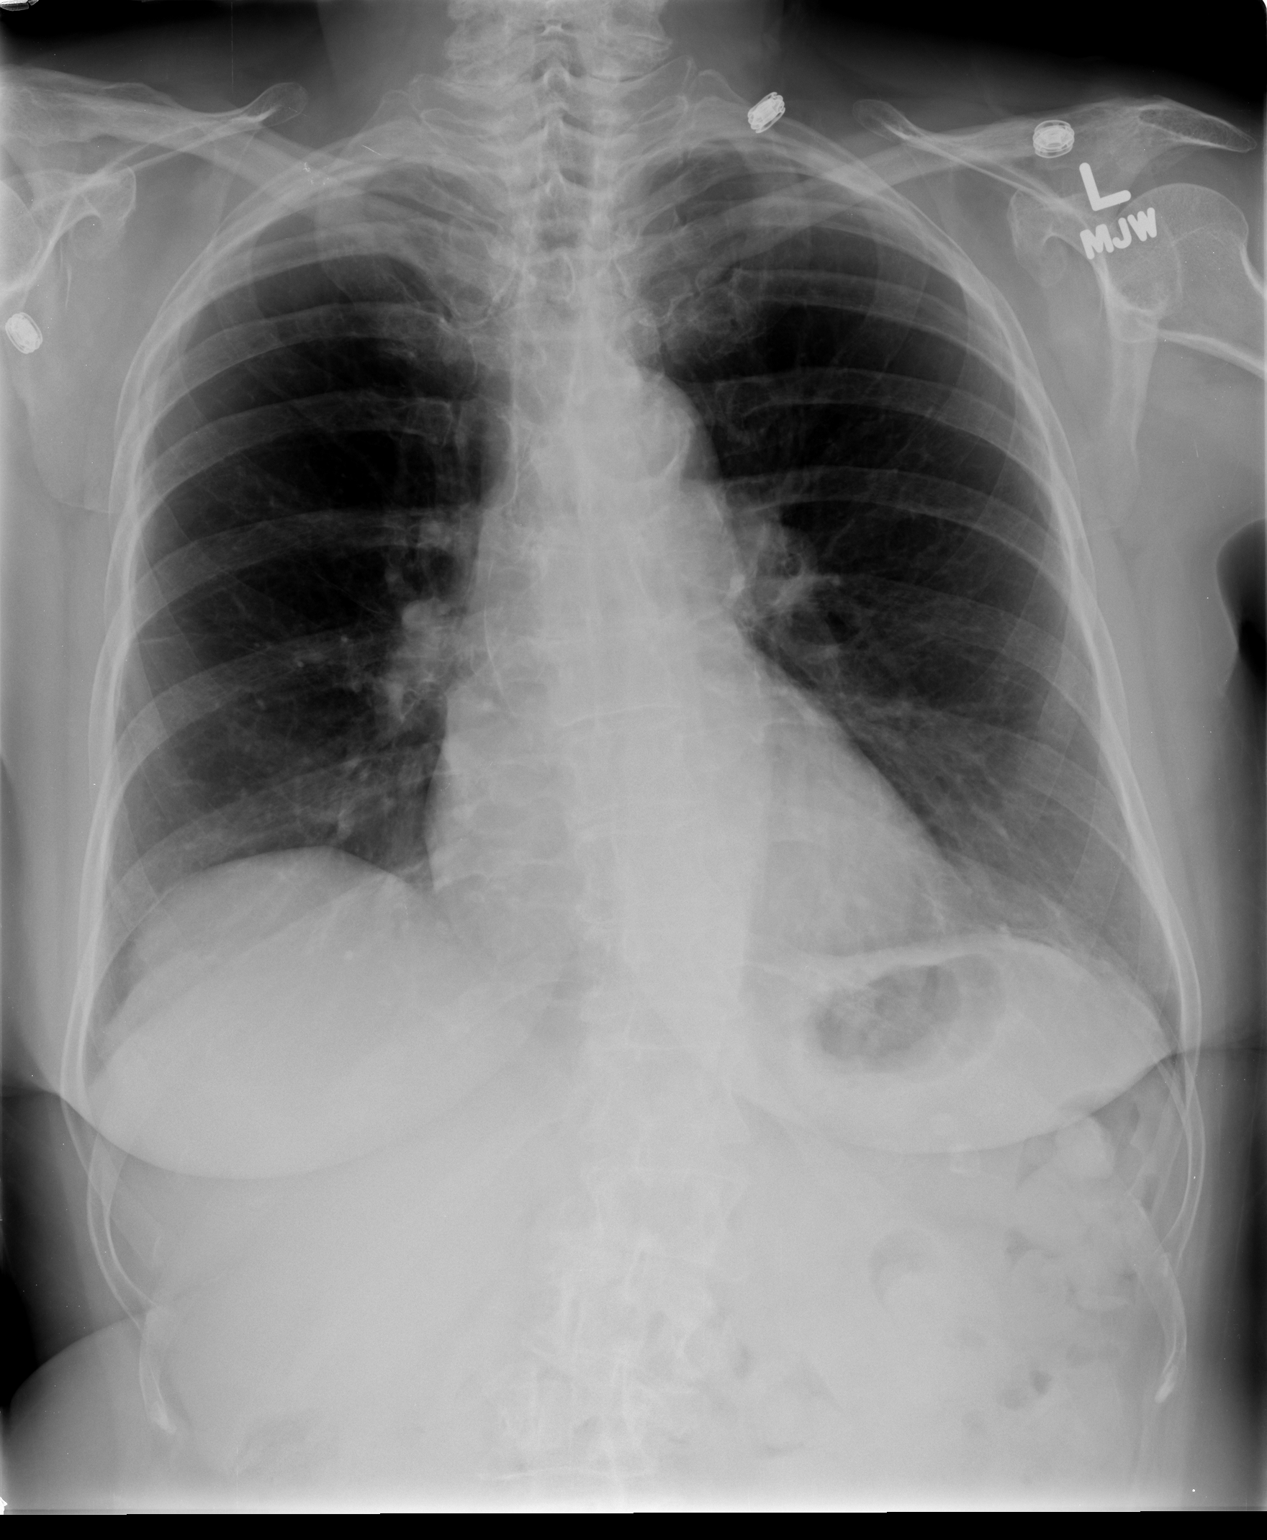

[view not recorded (2 of 2)]
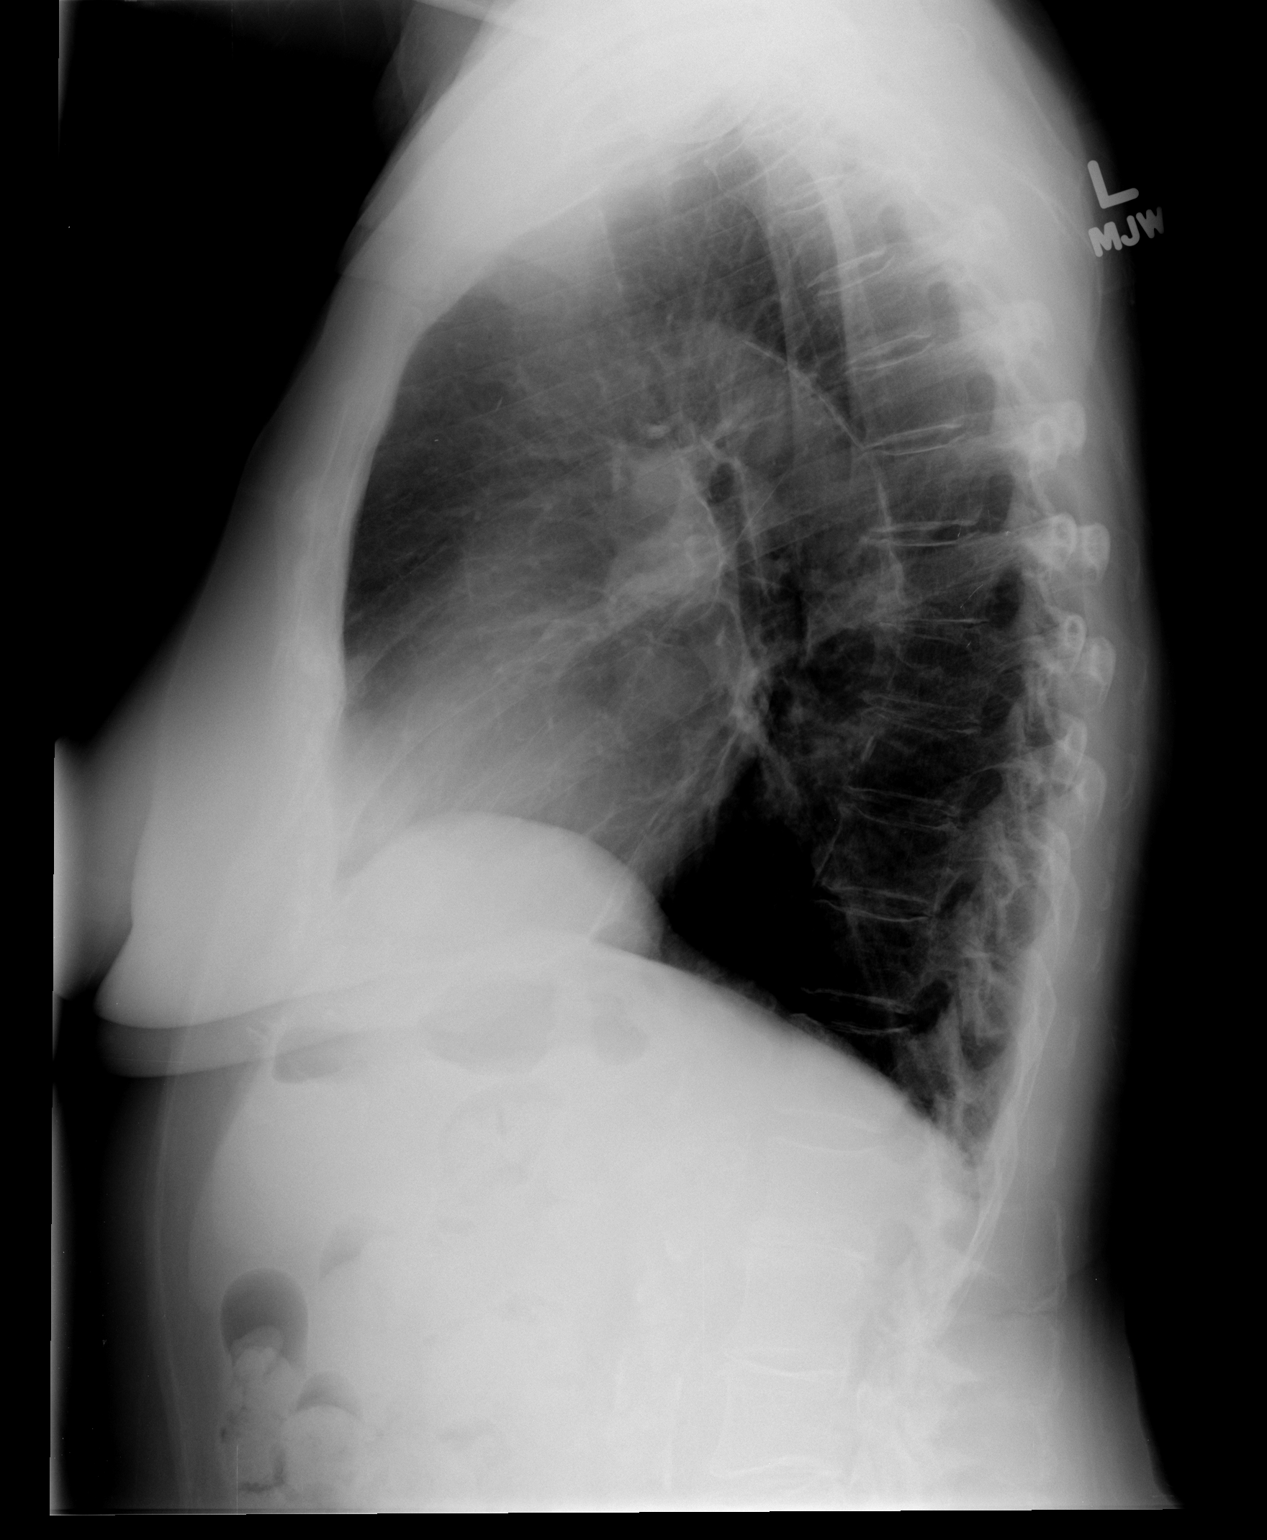

[2 of 2 positions shown; findings below may reference images not displayed]

FINDINGS: Cardiac silhouette normal in size, unchanged.  Thoracic
aorta atherosclerotic, unchanged.  Hilar and mediastinal contours
otherwise unremarkable.  Emphysematous changes in the upper lobes
and mild hyperinflation, unchanged.  Lungs clear.  No pleural
effusions.  Degenerative changes involve the thoracic spine,
probable osteopenia, and lower thoracic scoliosis convex left with
compensatory lumbar scoliosis convex right.
IMPRESSION: COPD/emphysema.  No acute cardiopulmonary disease.  Stable
examination.

## 2013-02-09 ENCOUNTER — Other Ambulatory Visit: Payer: Self-pay | Admitting: *Deleted

## 2013-02-09 MED ORDER — HYDROCODONE-ACETAMINOPHEN 5-325 MG PO TABS: 2.0000 | ORAL_TABLET | Freq: Three times a day (TID) | ORAL | Status: DC | PRN

## 2013-02-13 ENCOUNTER — Other Ambulatory Visit: Payer: Self-pay | Admitting: Family Medicine

## 2013-02-13 MED ORDER — HYDROCODONE-ACETAMINOPHEN 5-325 MG PO TABS: 2.0000 | ORAL_TABLET | Freq: Three times a day (TID) | ORAL | Status: DC | PRN

## 2013-02-19 ENCOUNTER — Encounter: Payer: Medicare Other | Admitting: *Deleted

## 2013-02-23 ENCOUNTER — Ambulatory Visit (INDEPENDENT_AMBULATORY_CARE_PROVIDER_SITE_OTHER): Payer: Medicare Other | Admitting: *Deleted

## 2013-02-23 ENCOUNTER — Telehealth: Payer: Self-pay | Admitting: Internal Medicine

## 2013-02-23 DIAGNOSIS — I442 Atrioventricular block, complete: Secondary | ICD-10-CM

## 2013-02-23 NOTE — Telephone Encounter (Signed)
New message    Pt said we did not call her last mon to transmit her pacer.  She want someone in the device clinic to call and explain how that works.  Will she need to come in the office or is this something that can be done over the phone

## 2013-02-23 NOTE — Telephone Encounter (Signed)
Pt unclear she had self home transmission appt on 02/19/13. She will transmit now. She knows how to transmit (successfully sent test transmission 12/17/12).   I let her know if she receives a letter due to missing a transmission on 02/19/13, she may disregard it.

## 2013-03-01 ENCOUNTER — Encounter: Payer: Self-pay | Admitting: *Deleted

## 2013-03-05 LAB — REMOTE PACEMAKER DEVICE
AL AMPLITUDE: 2.8 mv
AL IMPEDENCE PM: 544 Ohm
AL THRESHOLD: 0.625 V
BATTERY VOLTAGE: 2.79 V
RV LEAD THRESHOLD: 1 V

## 2013-03-08 NOTE — Progress Notes (Signed)
PPM remote 

## 2013-03-09 ENCOUNTER — Other Ambulatory Visit: Payer: Self-pay | Admitting: *Deleted

## 2013-03-09 MED ORDER — HYDROCODONE-ACETAMINOPHEN 5-325 MG PO TABS: 2.0000 | ORAL_TABLET | Freq: Three times a day (TID) | ORAL | Status: DC | PRN

## 2013-03-26 ENCOUNTER — Encounter: Payer: Self-pay | Admitting: *Deleted

## 2013-03-27 ENCOUNTER — Encounter: Payer: Self-pay | Admitting: Internal Medicine

## 2013-04-13 ENCOUNTER — Other Ambulatory Visit: Payer: Self-pay | Admitting: Family Medicine

## 2013-04-13 MED ORDER — PRAVASTATIN SODIUM 40 MG PO TABS: 40.0000 mg | ORAL_TABLET | Freq: Every day | ORAL | Status: DC

## 2013-05-28 ENCOUNTER — Telehealth: Payer: Self-pay | Admitting: Internal Medicine

## 2013-05-28 NOTE — Telephone Encounter (Signed)
New problem:  Pt is requesting a call back for clarification on her remote device appt. States she was told to call a number before she sends a transmission. PT wants to know what number she is to call.

## 2013-05-28 NOTE — Telephone Encounter (Signed)
Left message for patient explaining procedure for remote transmissions.

## 2013-05-29 ENCOUNTER — Ambulatory Visit (INDEPENDENT_AMBULATORY_CARE_PROVIDER_SITE_OTHER): Payer: Medicare Other | Admitting: *Deleted

## 2013-05-29 ENCOUNTER — Encounter: Payer: Self-pay | Admitting: Internal Medicine

## 2013-05-29 DIAGNOSIS — I442 Atrioventricular block, complete: Secondary | ICD-10-CM | POA: Diagnosis not present

## 2013-05-30 LAB — MDC_IDC_ENUM_SESS_TYPE_REMOTE
Battery Impedance: 110 Ohm
Battery Remaining Longevity: 136 mo
Battery Voltage: 2.79 V
Lead Channel Pacing Threshold Amplitude: 1 V
Lead Channel Pacing Threshold Pulse Width: 0.4 ms
Lead Channel Setting Pacing Amplitude: 2 V
Lead Channel Setting Pacing Amplitude: 2.5 V
Lead Channel Setting Pacing Pulse Width: 0.4 ms
MDC IDC MSMT LEADCHNL RA IMPEDANCE VALUE: 498 Ohm
MDC IDC MSMT LEADCHNL RA PACING THRESHOLD AMPLITUDE: 0.625 V
MDC IDC MSMT LEADCHNL RA PACING THRESHOLD PULSEWIDTH: 0.4 ms
MDC IDC MSMT LEADCHNL RA SENSING INTR AMPL: 2.8 mV
MDC IDC MSMT LEADCHNL RV IMPEDANCE VALUE: 562 Ohm
MDC IDC SESS DTM: 20150127150814
MDC IDC SET LEADCHNL RV SENSING SENSITIVITY: 2.8 mV
MDC IDC STAT BRADY AP VP PERCENT: 16 %
MDC IDC STAT BRADY AP VS PERCENT: 0 %
MDC IDC STAT BRADY AS VP PERCENT: 84 %
MDC IDC STAT BRADY AS VS PERCENT: 0 %

## 2013-06-08 ENCOUNTER — Other Ambulatory Visit: Payer: Self-pay | Admitting: Family Medicine

## 2013-06-08 MED ORDER — HYDROCODONE-ACETAMINOPHEN 5-325 MG PO TABS: ORAL_TABLET | ORAL | Status: DC

## 2013-06-08 MED ORDER — HYDROCODONE-ACETAMINOPHEN 5-325 MG PO TABS: 2.0000 | ORAL_TABLET | Freq: Three times a day (TID) | ORAL | Status: DC | PRN

## 2013-06-11 DIAGNOSIS — M25569 Pain in unspecified knee: Secondary | ICD-10-CM | POA: Diagnosis not present

## 2013-06-12 ENCOUNTER — Encounter: Payer: Self-pay | Admitting: *Deleted

## 2013-07-16 ENCOUNTER — Other Ambulatory Visit: Payer: Self-pay | Admitting: Family Medicine

## 2013-07-16 ENCOUNTER — Encounter: Payer: Self-pay | Admitting: Internal Medicine

## 2013-07-16 ENCOUNTER — Ambulatory Visit (INDEPENDENT_AMBULATORY_CARE_PROVIDER_SITE_OTHER): Payer: Medicare Other | Admitting: Internal Medicine

## 2013-07-16 ENCOUNTER — Ambulatory Visit (HOSPITAL_COMMUNITY): Payer: Medicare Other | Attending: Internal Medicine | Admitting: *Deleted

## 2013-07-16 VITALS — BP 127/59 | HR 70 | Ht 65.0 in | Wt 151.1 lb

## 2013-07-16 DIAGNOSIS — M7989 Other specified soft tissue disorders: Secondary | ICD-10-CM | POA: Insufficient documentation

## 2013-07-16 DIAGNOSIS — E785 Hyperlipidemia, unspecified: Secondary | ICD-10-CM | POA: Diagnosis not present

## 2013-07-16 DIAGNOSIS — R609 Edema, unspecified: Secondary | ICD-10-CM

## 2013-07-16 DIAGNOSIS — I2510 Atherosclerotic heart disease of native coronary artery without angina pectoris: Secondary | ICD-10-CM | POA: Insufficient documentation

## 2013-07-16 DIAGNOSIS — I10 Essential (primary) hypertension: Secondary | ICD-10-CM | POA: Diagnosis not present

## 2013-07-16 DIAGNOSIS — R600 Localized edema: Secondary | ICD-10-CM

## 2013-07-16 DIAGNOSIS — I442 Atrioventricular block, complete: Secondary | ICD-10-CM | POA: Diagnosis not present

## 2013-07-16 DIAGNOSIS — Z950 Presence of cardiac pacemaker: Secondary | ICD-10-CM

## 2013-07-16 LAB — MDC_IDC_ENUM_SESS_TYPE_INCLINIC
Battery Impedance: 110 Ohm
Battery Remaining Longevity: 135 mo
Brady Statistic AP VP Percent: 15 %
Brady Statistic AP VS Percent: 0 %
Brady Statistic AS VP Percent: 85 %
Brady Statistic AS VS Percent: 0 %
Date Time Interrogation Session: 20150316093329
Lead Channel Pacing Threshold Amplitude: 0.5 V
Lead Channel Pacing Threshold Amplitude: 0.75 V
Lead Channel Pacing Threshold Pulse Width: 0.4 ms
Lead Channel Pacing Threshold Pulse Width: 0.4 ms
Lead Channel Sensing Intrinsic Amplitude: 5.6 mV
MDC IDC MSMT BATTERY VOLTAGE: 2.79 V
MDC IDC MSMT LEADCHNL RA IMPEDANCE VALUE: 492 Ohm
MDC IDC MSMT LEADCHNL RV IMPEDANCE VALUE: 527 Ohm
MDC IDC SET LEADCHNL RA PACING AMPLITUDE: 2 V
MDC IDC SET LEADCHNL RV PACING AMPLITUDE: 2.5 V
MDC IDC SET LEADCHNL RV PACING PULSEWIDTH: 0.4 ms
MDC IDC SET LEADCHNL RV SENSING SENSITIVITY: 2.8 mV

## 2013-07-16 MED ORDER — HYDROCODONE-ACETAMINOPHEN 5-325 MG PO TABS: ORAL_TABLET | ORAL | Status: DC

## 2013-07-16 NOTE — Progress Notes (Signed)
Bilateral lower extremity venous duplex complete.  

## 2013-07-16 NOTE — Patient Instructions (Addendum)
Your physician has requested that you have a lower or upper extremity venous duplex. This test is an ultrasound of the veins in the legs or arms. It looks at venous blood flow that carries blood from the heart to the legs or arms. Allow one hour for a Lower Venous exam. Allow thirty minutes for an Upper Venous exam. There are no restrictions or special instructions.  Your physician recommends that you continue on your current medications as directed. Please refer to the Current Medication list given to you today.  Remote monitoring is used to monitor your Pacemaker of ICD from home. This monitoring reduces the number of office visits required to check your device to one time per year. It allows [place] to keep an eye on the functioning of your device to ensure it is working properly. You are scheduled for a device check from home on 10/17/13. You may send your transmission at any time that day. If you have a wireless device, the transmission will be sent automatically. After your physician reviews your transmission, you will receive a postcard with your next transmission date.  Your physician wants you to follow-up in: 1 year with Dr. Caryl Comes.  You will receive a reminder letter in the mail two months in advance. If you don't receive a letter, please call our office to schedule the follow-up appointment.

## 2013-07-16 NOTE — Progress Notes (Signed)
Patient Care Team: Dickie La, MD as PCP - General   HPI  Shelley Black is a 80 y.o. female Seen following pacemaker implantation 3/14 for complete heart block that was complicated by pause dependent polymorphic ventricular tachycardia   She comes in with complaints of swelling of her left leg. She was taken to travels. She has no history of clotting.     Past Medical History  Diagnosis Date  . Heart murmur     not seen by cardiologist  . Hypertension   . Arthritis   . Complete heart block   . Cardiac pacemaker Medtronic     Past Surgical History  Procedure Laterality Date  . Joint replacement      Left knee  . Thyroid surgery    . Appendectomy    . Eye surgery      cataract bil  . Total knee arthroplasty  12/24/2011    Procedure: TOTAL KNEE ARTHROPLASTY;  Surgeon: Alta Corning, MD;  Location: [facility];  Service: Orthopedics;  Laterality: Right;  general anesthesia with pre op femoral nerve block  . Rotator cuff repair Right     Current Outpatient Prescriptions  Medication Sig Dispense Refill  . amLODipine (NORVASC) 5 MG tablet Take 1 tablet (5 mg total) by mouth daily.  90 tablet  3  . Cholecalciferol (VITAMIN D3) 2000 UNITS TABS Take 1 tablet by mouth daily.      . hydrochlorothiazide (HYDRODIURIL) 25 MG tablet Take 1 tablet (25 mg total) by mouth daily.  90 tablet  3  . HYDROcodone-acetaminophen (NORCO/VICODIN) 5-325 MG per tablet Take 2 tabs by mouth every 8 hours as needed for arthritis pain Do not fill before August 10 2013  180 tablet  0  . Multiple Vitamin (MULTIVITAMIN WITH MINERALS) TABS Take 1 tablet by mouth daily.      . Omega-3 Fatty Acids (FISH OIL TRIPLE STRENGTH PO) Take 1 capsule by mouth daily.      . pravastatin (PRAVACHOL) 40 MG tablet Take 1 tablet (40 mg total) by mouth daily.  90 tablet  3  . QUEtiapine (SEROQUEL) 25 MG tablet Take one half tab at bedtime  30 tablet  12  . ramipril (ALTACE) 10 MG capsule TAKE ONE CAPSULE BY MOUTH TWICE  DAILY  180 capsule  3   No current facility-administered medications for this visit.    No Known Allergies  Review of Systems negative except from HPI and PMH  Physical Exam BP 127/59  Pulse 70  Ht 5\' 5"  (1.651 m)  Wt 151 lb 1.9 oz (68.548 kg)  BMI 25.15 kg/m2 Well developed and well nourished in no acute distress HENT normal E scleral and icterus clear Neck Supple JVP flat; carotids brisk and full Clear to ausculation  Regular rate and rhythm, no murmurs gallops or rub Soft with active bowel sounds No clubbing cyanosis 2+L and  None on right Edema Alert and oriented, grossly normal motor and sensory function Skin Warm and Dry;; left leg swollen and warm compared to R    Assessment and  Plan  Complete heart block  Polymorphic ventricular tachycardia-secondary  Pacemaker Medtronic The patient's device was interrogated.  The information was reviewed. No changes were made in the programming.    Unilateral lower extremity edema  The patient's rhythm is stable. There is no evidence of ventricular tachycardia. She is device dependent.  I'm concerned given the unilateral swelling with warmth without any evidence of skin break. We'll undertake  a venous Doppler.

## 2013-07-27 ENCOUNTER — Other Ambulatory Visit: Payer: Self-pay | Admitting: Family Medicine

## 2013-09-14 ENCOUNTER — Other Ambulatory Visit: Payer: Self-pay | Admitting: Family Medicine

## 2013-09-14 MED ORDER — HYDROCODONE-ACETAMINOPHEN 5-325 MG PO TABS: ORAL_TABLET | ORAL | Status: DC

## 2013-09-18 IMAGING — CR DG CHEST 1V PORT
1 series · 1 of 1 positions shown · non-contrast
Comparison: 07/29/2012

CLINICAL DATA: Pacemaker insertion

PORTABLE CHEST - 1 VIEW

[AP]
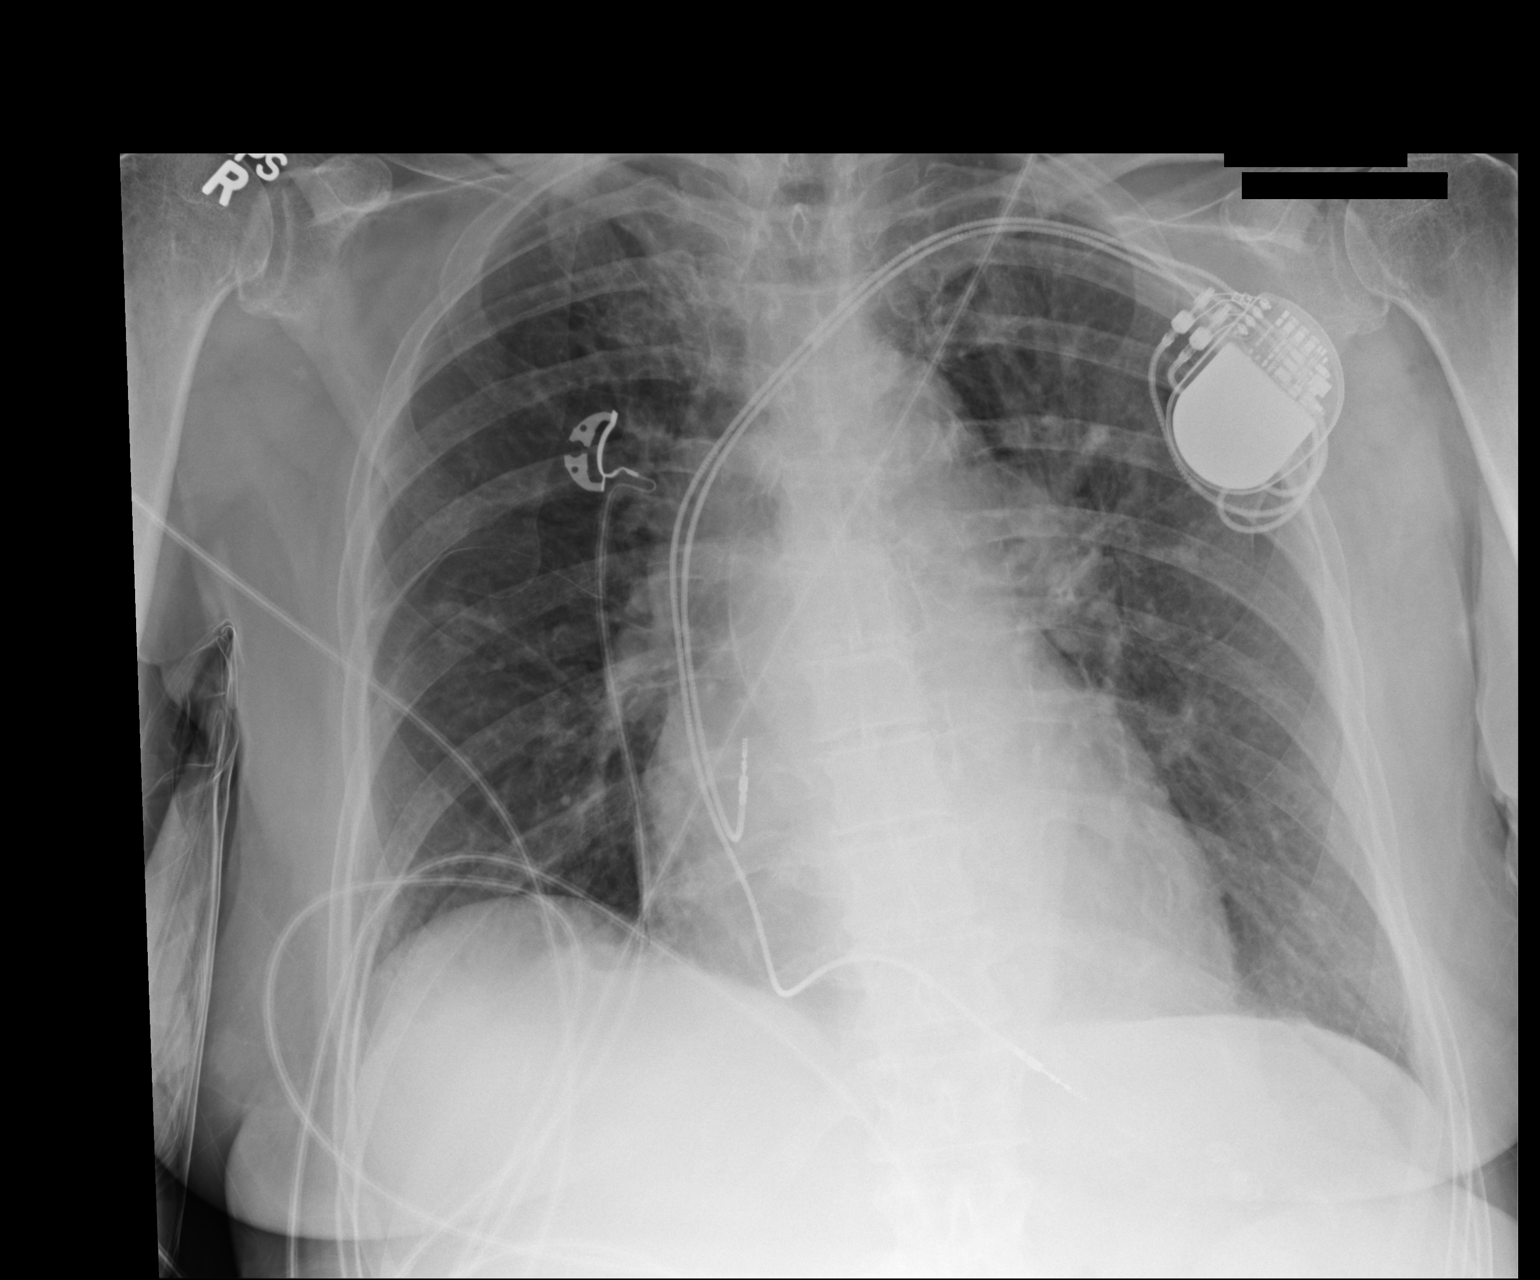

[1 of 1 positions shown; findings below may reference images not displayed]

FINDINGS: Left-sided transvenous pacemaker is in place with leads
in the right atrium and right ventricle.  No pneumothorax.
Negative for heart failure or effusion.
IMPRESSION: Satisfactory dual lead transvenous pacemaker.

## 2013-09-28 ENCOUNTER — Other Ambulatory Visit: Payer: Medicare Other

## 2013-09-28 ENCOUNTER — Other Ambulatory Visit: Payer: Self-pay | Admitting: Family Medicine

## 2013-09-28 DIAGNOSIS — E785 Hyperlipidemia, unspecified: Secondary | ICD-10-CM | POA: Diagnosis not present

## 2013-09-28 DIAGNOSIS — E049 Nontoxic goiter, unspecified: Secondary | ICD-10-CM | POA: Diagnosis not present

## 2013-09-28 DIAGNOSIS — I10 Essential (primary) hypertension: Secondary | ICD-10-CM | POA: Diagnosis not present

## 2013-09-28 LAB — LIPID PANEL
CHOLESTEROL: 181 mg/dL (ref 0–200)
HDL: 47 mg/dL (ref >39)
LDL Cholesterol: 89 mg/dL (ref 0–99)
TRIGLYCERIDES: 225 mg/dL — AB (ref <150)
Total CHOL/HDL Ratio: 3.9 Ratio
VLDL: 45 mg/dL — ABNORMAL HIGH (ref 0–40)

## 2013-09-28 LAB — COMPREHENSIVE METABOLIC PANEL
ALK PHOS: 45 U/L (ref 39–117)
ALT: 20 U/L (ref 0–35)
AST: 19 U/L (ref 0–37)
Albumin: 4 g/dL (ref 3.5–5.2)
BUN: 37 mg/dL — AB (ref 6–23)
CHLORIDE: 101 meq/L (ref 96–112)
CO2: 30 mEq/L (ref 19–32)
Calcium: 10.3 mg/dL (ref 8.4–10.5)
Creat: 1.07 mg/dL (ref 0.50–1.10)
Glucose, Bld: 102 mg/dL — ABNORMAL HIGH (ref 70–99)
Potassium: 4.1 mEq/L (ref 3.5–5.3)
Sodium: 139 mEq/L (ref 135–145)
Total Bilirubin: 0.3 mg/dL (ref 0.2–1.2)
Total Protein: 6.9 g/dL (ref 6.0–8.3)

## 2013-09-28 LAB — TSH: TSH: 3.091 u[IU]/mL (ref 0.350–4.500)

## 2013-09-28 NOTE — Progress Notes (Signed)
D-LDL CANCELED BY BAJ AND CHANGED TO LIPID Shelley Black

## 2013-09-28 NOTE — Progress Notes (Signed)
CMP,D-LDL,TSH DONE TODAY Macy Polio

## 2013-09-28 NOTE — Addendum Note (Signed)
Addended by: Martinique, Colie Fugitt on: 09/28/2013 11:21 AM   Modules accepted: Orders

## 2013-10-02 ENCOUNTER — Encounter: Payer: Self-pay | Admitting: Family Medicine

## 2013-10-02 DIAGNOSIS — M25569 Pain in unspecified knee: Secondary | ICD-10-CM | POA: Diagnosis not present

## 2013-10-04 ENCOUNTER — Other Ambulatory Visit: Payer: Self-pay | Admitting: *Deleted

## 2013-10-04 MED ORDER — RAMIPRIL 10 MG PO CAPS: ORAL_CAPSULE | ORAL | Status: DC

## 2013-10-17 ENCOUNTER — Ambulatory Visit (INDEPENDENT_AMBULATORY_CARE_PROVIDER_SITE_OTHER): Payer: Medicare Other | Admitting: *Deleted

## 2013-10-17 DIAGNOSIS — Z950 Presence of cardiac pacemaker: Secondary | ICD-10-CM

## 2013-10-17 DIAGNOSIS — I442 Atrioventricular block, complete: Secondary | ICD-10-CM | POA: Diagnosis not present

## 2013-10-17 LAB — MDC_IDC_ENUM_SESS_TYPE_REMOTE
Brady Statistic AP VP Percent: 15.6 %
Brady Statistic AS VP Percent: 84.4 %
Brady Statistic AS VS Percent: 0.1 %
Lead Channel Impedance Value: 483 Ohm
Lead Channel Impedance Value: 486 Ohm
Lead Channel Pacing Threshold Amplitude: 0.5 V
Lead Channel Pacing Threshold Amplitude: 0.75 V
Lead Channel Pacing Threshold Pulse Width: 0.4 ms
Lead Channel Sensing Intrinsic Amplitude: 2.8 mV
Lead Channel Setting Pacing Pulse Width: 0.4 ms
MDC IDC MSMT BATTERY REMAINING LONGEVITY: 121 mo
MDC IDC MSMT BATTERY VOLTAGE: 2.79 V
MDC IDC MSMT LEADCHNL RV PACING THRESHOLD PULSEWIDTH: 0.4 ms
MDC IDC SET LEADCHNL RA PACING AMPLITUDE: 2 V
MDC IDC SET LEADCHNL RV PACING AMPLITUDE: 2.5 V
MDC IDC SET LEADCHNL RV SENSING SENSITIVITY: 2.8 mV
MDC IDC STAT BRADY AP VS PERCENT: 0.1 %

## 2013-10-17 NOTE — Progress Notes (Signed)
Remote pacemaker transmission.   

## 2013-10-26 ENCOUNTER — Other Ambulatory Visit: Payer: Self-pay | Admitting: Family Medicine

## 2013-10-26 MED ORDER — HYDROCODONE-ACETAMINOPHEN 5-325 MG PO TABS: ORAL_TABLET | ORAL | Status: DC

## 2013-10-26 NOTE — Progress Notes (Signed)
Gave rx to her husband

## 2013-10-30 ENCOUNTER — Other Ambulatory Visit: Payer: Self-pay | Admitting: *Deleted

## 2013-10-30 DIAGNOSIS — I10 Essential (primary) hypertension: Secondary | ICD-10-CM

## 2013-10-30 MED ORDER — AMLODIPINE BESYLATE 5 MG PO TABS: 5.0000 mg | ORAL_TABLET | Freq: Every day | ORAL | Status: DC

## 2013-11-06 ENCOUNTER — Encounter: Payer: Self-pay | Admitting: Cardiology

## 2013-11-13 ENCOUNTER — Encounter: Payer: Self-pay | Admitting: Internal Medicine

## 2013-12-28 ENCOUNTER — Other Ambulatory Visit: Payer: Self-pay | Admitting: Family Medicine

## 2013-12-28 MED ORDER — HYDROCODONE-ACETAMINOPHEN 5-325 MG PO TABS: ORAL_TABLET | ORAL | Status: DC

## 2013-12-28 MED ORDER — QUETIAPINE FUMARATE 25 MG PO TABS: ORAL_TABLET | ORAL | Status: DC

## 2014-01-10 DIAGNOSIS — H35379 Puckering of macula, unspecified eye: Secondary | ICD-10-CM | POA: Diagnosis not present

## 2014-01-10 DIAGNOSIS — H524 Presbyopia: Secondary | ICD-10-CM | POA: Diagnosis not present

## 2014-01-10 DIAGNOSIS — H353190 Nonexudative age-related macular degeneration, unspecified eye, stage unspecified: Secondary | ICD-10-CM | POA: Diagnosis not present

## 2014-01-21 ENCOUNTER — Ambulatory Visit (INDEPENDENT_AMBULATORY_CARE_PROVIDER_SITE_OTHER): Payer: Medicare Other | Admitting: *Deleted

## 2014-01-21 DIAGNOSIS — I442 Atrioventricular block, complete: Secondary | ICD-10-CM | POA: Diagnosis not present

## 2014-01-21 LAB — MDC_IDC_ENUM_SESS_TYPE_REMOTE
Battery Remaining Longevity: 134 mo
Battery Voltage: 2.79 V
Brady Statistic AP VP Percent: 16 %
Brady Statistic AS VP Percent: 84 %
Lead Channel Impedance Value: 514 Ohm
Lead Channel Impedance Value: 528 Ohm
Lead Channel Pacing Threshold Amplitude: 0.75 V
Lead Channel Pacing Threshold Pulse Width: 0.4 ms
Lead Channel Sensing Intrinsic Amplitude: 2.8 mV
Lead Channel Setting Pacing Amplitude: 2 V
Lead Channel Setting Pacing Pulse Width: 0.4 ms
MDC IDC MSMT BATTERY IMPEDANCE: 110 Ohm
MDC IDC MSMT LEADCHNL RA PACING THRESHOLD AMPLITUDE: 0.5 V
MDC IDC MSMT LEADCHNL RV PACING THRESHOLD PULSEWIDTH: 0.4 ms
MDC IDC SESS DTM: 20150921121356
MDC IDC SET LEADCHNL RV PACING AMPLITUDE: 2.5 V
MDC IDC SET LEADCHNL RV SENSING SENSITIVITY: 2.8 mV
MDC IDC STAT BRADY AP VS PERCENT: 0 %
MDC IDC STAT BRADY AS VS PERCENT: 0 %

## 2014-01-21 NOTE — Progress Notes (Signed)
Remote pacemaker transmission.   

## 2014-02-04 ENCOUNTER — Encounter: Payer: Self-pay | Admitting: Cardiology

## 2014-02-06 DIAGNOSIS — Z23 Encounter for immunization: Secondary | ICD-10-CM | POA: Diagnosis not present

## 2014-02-12 ENCOUNTER — Encounter: Payer: Self-pay | Admitting: Internal Medicine

## 2014-02-18 ENCOUNTER — Encounter: Payer: Self-pay | Admitting: Family Medicine

## 2014-03-12 ENCOUNTER — Other Ambulatory Visit: Payer: Self-pay | Admitting: Family Medicine

## 2014-03-12 NOTE — Telephone Encounter (Signed)
Will sen refill through patient's husband Wynetta Emery chart. Note was taken through wife's

## 2014-03-12 NOTE — Telephone Encounter (Signed)
Patient's husband request refill for Vicodin 5-325 mg

## 2014-03-14 ENCOUNTER — Other Ambulatory Visit: Payer: Self-pay | Admitting: *Deleted

## 2014-03-18 NOTE — Telephone Encounter (Signed)
Dear [name] Team I have a record that she has a new rx that was not to be filled until November 1---did she get that one filled? THANKS! [name]

## 2014-03-22 ENCOUNTER — Other Ambulatory Visit: Payer: Self-pay | Admitting: Family Medicine

## 2014-03-22 MED ORDER — HYDROCODONE-ACETAMINOPHEN 5-325 MG PO TABS: ORAL_TABLET | ORAL | Status: DC

## 2014-03-22 NOTE — Progress Notes (Signed)
Dear [name] Team Please let her know itis ready THANKS! [name]

## 2014-04-01 NOTE — Progress Notes (Signed)
LVM for patient to call back to inform rx ready up front  if it not has been picked up.

## 2014-04-03 ENCOUNTER — Other Ambulatory Visit: Payer: Self-pay | Admitting: Family Medicine

## 2014-04-03 MED ORDER — HYDROCODONE-ACETAMINOPHEN 5-325 MG PO TABS: ORAL_TABLET | ORAL | Status: DC

## 2014-04-11 ENCOUNTER — Encounter (HOSPITAL_COMMUNITY): Payer: Self-pay | Admitting: Cardiology

## 2014-04-23 ENCOUNTER — Ambulatory Visit (INDEPENDENT_AMBULATORY_CARE_PROVIDER_SITE_OTHER): Payer: Medicare Other | Admitting: *Deleted

## 2014-04-23 DIAGNOSIS — I442 Atrioventricular block, complete: Secondary | ICD-10-CM | POA: Diagnosis not present

## 2014-04-23 DIAGNOSIS — Z950 Presence of cardiac pacemaker: Secondary | ICD-10-CM

## 2014-04-23 LAB — MDC_IDC_ENUM_SESS_TYPE_REMOTE
Battery Remaining Longevity: 126 mo
Battery Voltage: 2.79 V
Brady Statistic AP VP Percent: 16.2 %
Brady Statistic AP VS Percent: 0.1 %
Brady Statistic AS VS Percent: 0.1 %
Lead Channel Impedance Value: 514 Ohm
Lead Channel Pacing Threshold Amplitude: 0.875 V
Lead Channel Pacing Threshold Pulse Width: 0.4 ms
Lead Channel Setting Pacing Amplitude: 2 V
Lead Channel Setting Pacing Pulse Width: 0.4 ms
Lead Channel Setting Sensing Sensitivity: 2.8 mV
MDC IDC MSMT LEADCHNL RA IMPEDANCE VALUE: 505 Ohm
MDC IDC MSMT LEADCHNL RA PACING THRESHOLD AMPLITUDE: 0.5 V
MDC IDC MSMT LEADCHNL RA PACING THRESHOLD PULSEWIDTH: 0.4 ms
MDC IDC MSMT LEADCHNL RA SENSING INTR AMPL: 2.8 mV
MDC IDC SET LEADCHNL RV PACING AMPLITUDE: 2.5 V
MDC IDC STAT BRADY AS VP PERCENT: 83.7 %

## 2014-04-23 NOTE — Progress Notes (Signed)
Remote pacemaker transmission.   

## 2014-05-01 ENCOUNTER — Other Ambulatory Visit: Payer: Self-pay | Admitting: Internal Medicine

## 2014-05-13 ENCOUNTER — Telehealth: Payer: Self-pay | Admitting: *Deleted

## 2014-05-13 NOTE — Telephone Encounter (Signed)
See refill request below. Let me know if this is ok to refill?

## 2014-05-13 NOTE — Telephone Encounter (Signed)
-----   Message from Carolyne Littles sent at 05/13/2014 11:10 AM EST ----- Regarding: refill request per husband Refill request for pravastatin  40 MG tablet [facility] is pharmacy

## 2014-05-14 MED ORDER — PRAVASTATIN SODIUM 40 MG PO TABS
40.0000 mg | ORAL_TABLET | Freq: Every day | ORAL | Status: DC
Start: 2014-05-14 — End: 2015-05-12

## 2014-05-14 NOTE — Telephone Encounter (Signed)
Done Shelley Black  

## 2014-05-17 ENCOUNTER — Encounter: Payer: Self-pay | Admitting: Cardiology

## 2014-05-23 ENCOUNTER — Encounter: Payer: Self-pay | Admitting: Internal Medicine

## 2014-06-03 ENCOUNTER — Telehealth: Payer: Self-pay | Admitting: Internal Medicine

## 2014-06-03 NOTE — Telephone Encounter (Signed)
Informed pt that she does not need to send transmission pt verbalized understanding.

## 2014-06-03 NOTE — Telephone Encounter (Signed)
New Message         Pt calling stating that she just did a remote pacer check 05/17/14 and got a phone call stating that she needs to do another one this coming Thursday. Pt wants to know why she needs to do it again. Please call back and advise.

## 2014-06-12 ENCOUNTER — Other Ambulatory Visit: Payer: Self-pay | Admitting: Family Medicine

## 2014-06-12 NOTE — Telephone Encounter (Signed)
Husband called because he is going to be in the area and wanted to know if Dr. Nori Riis would write out his wife prescription of Vicodin and leave it up front. He knows its not do until 07/02/14 he was just in the area. jw

## 2014-06-13 MED ORDER — HYDROCODONE-ACETAMINOPHEN 5-325 MG PO TABS: ORAL_TABLET | ORAL | Status: DC

## 2014-06-13 NOTE — Telephone Encounter (Signed)
Dear Shelley Black Team This is handwritten and placed up front please let him know Also he wanted me to give him 3 months of rx and I cannot do that now so he will likely have to pick it up every month---so sorry THANKS! [name]

## 2014-06-14 NOTE — Telephone Encounter (Signed)
Spoke with patient and informed of below 

## 2014-06-28 ENCOUNTER — Other Ambulatory Visit: Payer: Self-pay | Admitting: Family Medicine

## 2014-06-28 NOTE — Progress Notes (Signed)
Shelley Black Looking at my chart she should HAVE a rx that she can fill February 25. Is she wanting one for NEXT MONTH? See if you can figure this out [facility]! [name]

## 2014-07-03 NOTE — Progress Notes (Signed)
Spoke with the pt and she said she has enough vicoden to last until the end of March.  Will call when she needs more

## 2014-07-04 NOTE — Progress Notes (Signed)
Perfect THANKS! Shelley Black

## 2014-07-10 ENCOUNTER — Other Ambulatory Visit: Payer: Self-pay | Admitting: Family Medicine

## 2014-07-19 ENCOUNTER — Other Ambulatory Visit: Payer: Self-pay | Admitting: Family Medicine

## 2014-07-19 MED ORDER — HYDROCODONE-ACETAMINOPHEN 5-325 MG PO TABS: ORAL_TABLET | ORAL | Status: DC

## 2014-07-27 ENCOUNTER — Other Ambulatory Visit: Payer: Self-pay | Admitting: Internal Medicine

## 2014-08-23 ENCOUNTER — Ambulatory Visit (INDEPENDENT_AMBULATORY_CARE_PROVIDER_SITE_OTHER): Payer: Medicare Other | Admitting: Family Medicine

## 2014-08-23 ENCOUNTER — Encounter: Payer: Self-pay | Admitting: Family Medicine

## 2014-08-23 VITALS — BP 139/44 | Ht 65.5 in | Wt 150.0 lb

## 2014-08-23 DIAGNOSIS — Z96653 Presence of artificial knee joint, bilateral: Secondary | ICD-10-CM | POA: Diagnosis not present

## 2014-08-23 DIAGNOSIS — R110 Nausea: Secondary | ICD-10-CM

## 2014-08-23 DIAGNOSIS — I10 Essential (primary) hypertension: Secondary | ICD-10-CM | POA: Diagnosis not present

## 2014-08-23 MED ORDER — ONDANSETRON HCL 4 MG PO TABS: ORAL_TABLET | ORAL | Status: DC

## 2014-08-23 NOTE — Assessment & Plan Note (Signed)
Bilateral TKR doing extremely well.

## 2014-08-23 NOTE — Assessment & Plan Note (Signed)
Excellent control. No med changes. At next visit we probably should check some blood work on her to check her kidney function etc.

## 2014-08-23 NOTE — Progress Notes (Signed)
   Subjective:    Patient ID: Shelley Black, female    DOB: 1931-01-09, 81 y.o.   MRN: [id number]  HPI #1. Follow-up knee pain. She status post TKR bilaterally. Feels like this is doing pretty well. She continues to use some pain medicine for her low back pain and hip pain. She had hoped with the new knee replacements this would also these off but it has stayed about the same. #2. She brings with her a list of her blood pressures. She's having no problems, specifically no dizziness, no chest pain no shortness of breath. She does not have to sleep on any additional pills at night she's not having any lower extremity edema. Her energy level is pretty get although she says she naps more than she used to. #3. She started having daily nausea that occurs every day around 2:00. She's not really had any change in her diet or her medication regimen. She noticed that is she took some Vicodin for her low back and hip pain that her nausea went away but she doesn't want to have to take her pain medicine 't for nausea. She continues to have normal appetite, says she continues to gradually gain weight over the last 2 years but nothing significantly changed recently. Bowel movements are normal.   Review of Systems See history of present illness.    Objective:   Physical Exam  Vital signs are reviewed GEN.: Well-developed female no acute distress KNEES: She has good extension and flexion bilaterally. She has well-healed TKR scars. Bilaterally the calves are soft. She can get up from a chair without any assistance. She has normal gait. ABDOMEN: Soft, positive bowel sounds. Nontender nondistended no rebound or guarding. CV: Regular rate and rhythm without murmur. Blood pressure diary is reviewed and all her pressures are under 470 systolic and greater than 929 systolic. Diastolics are in the 57-47 range.      Assessment & Plan:

## 2014-08-26 ENCOUNTER — Ambulatory Visit (INDEPENDENT_AMBULATORY_CARE_PROVIDER_SITE_OTHER): Payer: Medicare Other | Admitting: Internal Medicine

## 2014-08-26 ENCOUNTER — Encounter: Payer: Self-pay | Admitting: Internal Medicine

## 2014-08-26 VITALS — BP 124/68 | HR 79 | Ht 65.0 in | Wt 158.4 lb

## 2014-08-26 DIAGNOSIS — Z45018 Encounter for adjustment and management of other part of cardiac pacemaker: Secondary | ICD-10-CM

## 2014-08-26 DIAGNOSIS — I442 Atrioventricular block, complete: Secondary | ICD-10-CM | POA: Diagnosis not present

## 2014-08-26 DIAGNOSIS — Z950 Presence of cardiac pacemaker: Secondary | ICD-10-CM | POA: Diagnosis not present

## 2014-08-26 LAB — MDC_IDC_ENUM_SESS_TYPE_INCLINIC
Brady Statistic AP VP Percent: 16 %
Brady Statistic AP VS Percent: 0 %
Brady Statistic AS VP Percent: 84 %
Brady Statistic AS VS Percent: 0 %
Date Time Interrogation Session: 20160425163306
Lead Channel Impedance Value: 529 Ohm
Lead Channel Impedance Value: 559 Ohm
Lead Channel Pacing Threshold Amplitude: 0.5 V
Lead Channel Pacing Threshold Pulse Width: 0.4 ms
Lead Channel Sensing Intrinsic Amplitude: 2.8 mV
Lead Channel Sensing Intrinsic Amplitude: 4 mV
Lead Channel Setting Pacing Amplitude: 2 V
Lead Channel Setting Pacing Pulse Width: 0.4 ms
Lead Channel Setting Sensing Sensitivity: 2 mV
MDC IDC MSMT BATTERY IMPEDANCE: 134 Ohm
MDC IDC MSMT BATTERY REMAINING LONGEVITY: 130 mo
MDC IDC MSMT BATTERY VOLTAGE: 2.79 V
MDC IDC MSMT LEADCHNL RV PACING THRESHOLD AMPLITUDE: 0.75 V
MDC IDC MSMT LEADCHNL RV PACING THRESHOLD PULSEWIDTH: 0.4 ms
MDC IDC SET LEADCHNL RV PACING AMPLITUDE: 2.5 V

## 2014-08-26 NOTE — Patient Instructions (Signed)
Medication Instructions:  Your physician recommends that you continue on your current medications as directed. Please refer to the Current Medication list given to you today.  Labwork: None  Testing/Procedures: None  Follow-Up: Remote monitoring is used to monitor your Pacemaker of ICD from home. This monitoring reduces the number of office visits required to check your device to one time per year. It allows [place] to keep an eye on the functioning of your device to ensure it is working properly. You are scheduled for a device check from home on 11/25/14. You may send your transmission at any time that day. If you have a wireless device, the transmission will be sent automatically. After your physician reviews your transmission, you will receive a postcard with your next transmission date.  Your physician wants you to follow-up in: 1 year with Dr. Caryl Comes.  You will receive a reminder letter in the mail two months in advance. If you don't receive a letter, please call our office to schedule the follow-up appointment.   Any Other Special Instructions Will Be Listed Below (If Applicable).

## 2014-08-26 NOTE — Progress Notes (Signed)
Patient Care Team: Dickie La, MD as PCP - General   HPI  Shelley Black is a 81 y.o. female Seen following pacemaker implantation 3/14 for complete heart block that was complicated by pause dependent polymorphic ventricular tachycardia   She comes in   Today without complaints.   She denies chest pain shortness of breath. This is a little bit of edema in her extremities. It is not problematic. She is pleased that her blood pressures are much better controlled since her visit last year.   Past Medical History  Diagnosis Date  . Heart murmur     not seen by cardiologist  . Hypertension   . Arthritis   . Complete heart block   . Cardiac pacemaker Medtronic     Past Surgical History  Procedure Laterality Date  . Joint replacement      Left knee  . Thyroid surgery    . Appendectomy    . Eye surgery      cataract bil  . Total knee arthroplasty  12/24/2011    Procedure: TOTAL KNEE ARTHROPLASTY;  Surgeon: Alta Corning, MD;  Location: [facility];  Service: Orthopedics;  Laterality: Right;  general anesthesia with pre op femoral nerve block  . Rotator cuff repair Right   . Left heart catheterization with coronary angiogram N/A 07/29/2012    Procedure: LEFT HEART CATHETERIZATION WITH CORONARY ANGIOGRAM;  Surgeon: Peter M Martinique, MD;  Location: [facility] CATH LAB;  Service: Cardiovascular;  Laterality: N/A;  . Temporary pacemaker insertion  07/29/2012    Procedure: TEMPORARY PACEMAKER INSERTION;  Surgeon: Peter M Martinique, MD;  Location: [facility] CATH LAB;  Service: Cardiovascular;;  . Permanent pacemaker insertion N/A 07/31/2012    Procedure: PERMANENT PACEMAKER INSERTION;  Surgeon: Deboraha Sprang, MD;  Location: [facility] CATH LAB;  Service: Cardiovascular;  Laterality: N/A;    Current Outpatient Prescriptions  Medication Sig Dispense Refill  . amLODipine (NORVASC) 5 MG tablet TAKE ONE TABLET BY MOUTH ONCE DAILY 90 tablet 0  . Cholecalciferol (VITAMIN D3) 2000 UNITS TABS Take 1 tablet by mouth  daily.    . hydrochlorothiazide (HYDRODIURIL) 25 MG tablet TAKE 1 TABLET BY MOUTH ONE TIME DAILY 90 tablet 3  . HYDROcodone-acetaminophen (NORCO/VICODIN) 5-325 MG per tablet Take 2 tabs by mouth every 8 hours as needed for arthritis pain Do not fill Sep 25 2014 180 tablet 0  . Multiple Vitamin (MULTIVITAMIN WITH MINERALS) TABS Take 1 tablet by mouth daily.    . Omega-3 Fatty Acids (FISH OIL TRIPLE STRENGTH PO) Take 1 capsule by mouth daily.    . ondansetron (ZOFRAN) 4 MG tablet Take one daily 30 tablet 5  . pravastatin (PRAVACHOL) 40 MG tablet Take 1 tablet (40 mg total) by mouth daily. 90 tablet 3  . QUEtiapine (SEROQUEL) 25 MG tablet Take one half or one whole tab at bedtime 30 tablet 12  . ramipril (ALTACE) 10 MG capsule TAKE ONE CAPSULE BY MOUTH TWICE DAILY 180 capsule 3   No current facility-administered medications for this visit.    No Known Allergies  Review of Systems negative except from HPI and PMH  Physical Exam BP 124/68 mmHg  Pulse 79  Ht 5\' 5"  (1.651 m)  Wt 158 lb 6.4 oz (71.85 kg)  BMI 26.36 kg/m2 Well developed and well nourished in no acute distress HENT normal E scleral and icterus clear Neck Supple JVP flat; carotids brisk and full Clear to ausculation Device pocket well healed; without hematoma or erythema.  There is no tethering  Regular rate and rhythm, no murmurs gallops or rub Soft with active bowel sounds No clubbing cyanosis 1+L and  None on right Edema Alert and oriented, grossly normal motor and sensory function Skin Warm and Dry;; left leg swollen and warm compared to R   ECG demonstrates P synchronous pacing with frequent PACs  Assessment and  Plan  Complete heart block -intermittent  Polymorphic ventricular tachycardia-secondary  No intercurrent Ventricular tachycardia  Pacemaker Medtronic The patient's device was interrogated.  The information was reviewed. No changes were made in the programming.    Unilateral lower extremity edema    hypertension  The patient's rhythm is stable. There is no evidence of ventricular tachycardia. She is no longer device dependent. We have reprogrammed her with AV search hysteresis.  Blood pressure is well controlled

## 2014-10-14 ENCOUNTER — Other Ambulatory Visit: Payer: Self-pay | Admitting: Family Medicine

## 2014-10-14 MED ORDER — HYDROCODONE-ACETAMINOPHEN 5-325 MG PO TABS: ORAL_TABLET | ORAL | Status: DC

## 2014-10-14 NOTE — Progress Notes (Signed)
Here w her husband for his appt Wants to go ahead and get her refills Doing well altho arthritis pain is somewhat worse Shelley Black

## 2014-10-16 ENCOUNTER — Other Ambulatory Visit: Payer: Self-pay | Admitting: Family Medicine

## 2014-10-23 ENCOUNTER — Other Ambulatory Visit: Payer: Self-pay | Admitting: Internal Medicine

## 2014-10-24 ENCOUNTER — Other Ambulatory Visit: Payer: Self-pay

## 2014-10-24 MED ORDER — AMLODIPINE BESYLATE 5 MG PO TABS: 5.0000 mg | ORAL_TABLET | Freq: Every day | ORAL | Status: DC

## 2014-11-06 ENCOUNTER — Other Ambulatory Visit: Payer: Self-pay | Admitting: Family Medicine

## 2014-11-06 DIAGNOSIS — Z5181 Encounter for therapeutic drug level monitoring: Secondary | ICD-10-CM

## 2014-11-06 DIAGNOSIS — I10 Essential (primary) hypertension: Secondary | ICD-10-CM

## 2014-11-06 DIAGNOSIS — I442 Atrioventricular block, complete: Secondary | ICD-10-CM

## 2014-11-06 DIAGNOSIS — E049 Nontoxic goiter, unspecified: Secondary | ICD-10-CM

## 2014-11-07 ENCOUNTER — Other Ambulatory Visit: Payer: Medicare Other

## 2014-11-07 DIAGNOSIS — E049 Nontoxic goiter, unspecified: Secondary | ICD-10-CM

## 2014-11-07 DIAGNOSIS — I442 Atrioventricular block, complete: Secondary | ICD-10-CM | POA: Diagnosis not present

## 2014-11-07 DIAGNOSIS — I10 Essential (primary) hypertension: Secondary | ICD-10-CM | POA: Diagnosis not present

## 2014-11-07 DIAGNOSIS — Z5181 Encounter for therapeutic drug level monitoring: Secondary | ICD-10-CM | POA: Diagnosis not present

## 2014-11-07 LAB — CBC WITH DIFFERENTIAL/PLATELET
BASOS ABS: 0.1 10*3/uL (ref 0.0–0.1)
Basophils Relative: 1 % (ref 0–1)
EOS PCT: 6 % — AB (ref 0–5)
Eosinophils Absolute: 0.6 10*3/uL (ref 0.0–0.7)
HCT: 38.2 % (ref 36.0–46.0)
HEMOGLOBIN: 12.4 g/dL (ref 12.0–15.0)
LYMPHS ABS: 3.1 10*3/uL (ref 0.7–4.0)
LYMPHS PCT: 33 % (ref 12–46)
MCH: 30 pg (ref 26.0–34.0)
MCHC: 32.5 g/dL (ref 30.0–36.0)
MCV: 92.5 fL (ref 78.0–100.0)
MPV: 8.9 fL (ref 8.6–12.4)
Monocytes Absolute: 0.7 10*3/uL (ref 0.1–1.0)
Monocytes Relative: 8 % (ref 3–12)
Neutro Abs: 4.8 10*3/uL (ref 1.7–7.7)
Neutrophils Relative %: 52 % (ref 43–77)
Platelets: 389 10*3/uL (ref 150–400)
RBC: 4.13 MIL/uL (ref 3.87–5.11)
RDW: 13.5 % (ref 11.5–15.5)
WBC: 9.3 10*3/uL (ref 4.0–10.5)

## 2014-11-07 LAB — COMPREHENSIVE METABOLIC PANEL
ALBUMIN: 3.7 g/dL (ref 3.5–5.2)
ALK PHOS: 47 U/L (ref 39–117)
ALT: 24 U/L (ref 0–35)
AST: 20 U/L (ref 0–37)
BUN: 36 mg/dL — AB (ref 6–23)
CO2: 29 mEq/L (ref 19–32)
Calcium: 10.2 mg/dL (ref 8.4–10.5)
Chloride: 100 mEq/L (ref 96–112)
Creat: 1.16 mg/dL — ABNORMAL HIGH (ref 0.50–1.10)
GLUCOSE: 101 mg/dL — AB (ref 70–99)
POTASSIUM: 4.1 meq/L (ref 3.5–5.3)
SODIUM: 139 meq/L (ref 135–145)
Total Bilirubin: 0.2 mg/dL (ref 0.2–1.2)
Total Protein: 6.8 g/dL (ref 6.0–8.3)

## 2014-11-07 LAB — TSH: TSH: 2.287 u[IU]/mL (ref 0.350–4.500)

## 2014-11-07 LAB — LDL CHOLESTEROL, DIRECT: Direct LDL: 98 mg/dL

## 2014-11-07 NOTE — Progress Notes (Signed)
CMP,CBC WITH DIFF,D-LDL AND TSH DONE TODAY Shelley Black

## 2014-11-12 ENCOUNTER — Encounter: Payer: Self-pay | Admitting: Family Medicine

## 2014-11-25 ENCOUNTER — Ambulatory Visit (INDEPENDENT_AMBULATORY_CARE_PROVIDER_SITE_OTHER): Payer: Medicare Other | Admitting: *Deleted

## 2014-11-25 DIAGNOSIS — I442 Atrioventricular block, complete: Secondary | ICD-10-CM

## 2014-12-06 DIAGNOSIS — I442 Atrioventricular block, complete: Secondary | ICD-10-CM | POA: Diagnosis not present

## 2014-12-06 NOTE — Progress Notes (Signed)
Remote pacemaker transmission.   

## 2014-12-09 LAB — CUP PACEART REMOTE DEVICE CHECK
Battery Impedance: 158 Ohm
Battery Remaining Longevity: 124 mo
Brady Statistic AP VP Percent: 11 %
Brady Statistic AP VS Percent: 0 %
Brady Statistic AS VS Percent: 1 %
Lead Channel Impedance Value: 514 Ohm
Lead Channel Pacing Threshold Amplitude: 0.5 V
Lead Channel Pacing Threshold Amplitude: 0.875 V
Lead Channel Pacing Threshold Pulse Width: 0.4 ms
Lead Channel Pacing Threshold Pulse Width: 0.4 ms
Lead Channel Setting Pacing Amplitude: 2 V
Lead Channel Setting Pacing Amplitude: 2.5 V
Lead Channel Setting Pacing Pulse Width: 0.4 ms
Lead Channel Setting Sensing Sensitivity: 2 mV
MDC IDC MSMT BATTERY VOLTAGE: 2.79 V
MDC IDC MSMT LEADCHNL RA IMPEDANCE VALUE: 505 Ohm
MDC IDC MSMT LEADCHNL RA SENSING INTR AMPL: 2.8 mV — AB
MDC IDC SESS DTM: 20160805175147
MDC IDC STAT BRADY AS VP PERCENT: 88 %

## 2014-12-27 ENCOUNTER — Other Ambulatory Visit: Payer: Self-pay | Admitting: Family Medicine

## 2014-12-27 MED ORDER — HYDROCODONE-ACETAMINOPHEN 5-325 MG PO TABS: ORAL_TABLET | ORAL | Status: DC

## 2015-01-02 ENCOUNTER — Encounter: Payer: Self-pay | Admitting: Cardiology

## 2015-01-13 ENCOUNTER — Other Ambulatory Visit: Payer: Self-pay | Admitting: Family Medicine

## 2015-01-13 NOTE — Telephone Encounter (Signed)
Refill completed.  Martin, Tamika L, RN  

## 2015-01-15 ENCOUNTER — Encounter: Payer: Self-pay | Admitting: Internal Medicine

## 2015-01-20 ENCOUNTER — Other Ambulatory Visit: Payer: Self-pay | Admitting: Family Medicine

## 2015-01-20 MED ORDER — HYDROCODONE-ACETAMINOPHEN 5-325 MG PO TABS: ORAL_TABLET | ORAL | Status: DC

## 2015-01-30 ENCOUNTER — Ambulatory Visit: Payer: Medicare Other

## 2015-02-20 ENCOUNTER — Other Ambulatory Visit: Payer: Self-pay | Admitting: Family Medicine

## 2015-02-21 ENCOUNTER — Other Ambulatory Visit: Payer: Self-pay | Admitting: *Deleted

## 2015-02-21 MED ORDER — QUETIAPINE FUMARATE 25 MG PO TABS: 25.0000 mg | ORAL_TABLET | Freq: Every day | ORAL | Status: DC

## 2015-02-25 NOTE — Telephone Encounter (Signed)
Pt tried one of her husbands tramadol. She thinks it works better and would like to switch to that. She has been on hydrocodone a long time--unclear to me she will be able to totally switch but I would be happy as I think safer in general given her age. Will call in tramadol at next time she needs refill. [name]

## 2015-03-10 ENCOUNTER — Telehealth: Payer: Self-pay | Admitting: Cardiology

## 2015-03-10 ENCOUNTER — Ambulatory Visit (INDEPENDENT_AMBULATORY_CARE_PROVIDER_SITE_OTHER): Payer: Medicare Other | Admitting: *Deleted

## 2015-03-10 DIAGNOSIS — I442 Atrioventricular block, complete: Secondary | ICD-10-CM

## 2015-03-10 NOTE — Telephone Encounter (Signed)
Spoke with pt and reminded pt of remote transmission that is due today. Pt verbalized understanding.   

## 2015-03-10 NOTE — Progress Notes (Signed)
Remote pacemaker transmission.   

## 2015-03-13 DIAGNOSIS — Z23 Encounter for immunization: Secondary | ICD-10-CM | POA: Diagnosis not present

## 2015-03-17 DIAGNOSIS — H35372 Puckering of macula, left eye: Secondary | ICD-10-CM | POA: Diagnosis not present

## 2015-03-21 ENCOUNTER — Other Ambulatory Visit: Payer: Self-pay | Admitting: *Deleted

## 2015-03-21 ENCOUNTER — Other Ambulatory Visit: Payer: Self-pay | Admitting: Family Medicine

## 2015-03-21 MED ORDER — TRAMADOL HCL 50 MG PO TABS: 100.0000 mg | ORAL_TABLET | Freq: Three times a day (TID) | ORAL | Status: DC | PRN

## 2015-03-25 ENCOUNTER — Encounter: Payer: Self-pay | Admitting: Family Medicine

## 2015-03-25 ENCOUNTER — Other Ambulatory Visit: Payer: Self-pay | Admitting: Family Medicine

## 2015-03-25 MED ORDER — TRAMADOL HCL 50 MG PO TABS: 100.0000 mg | ORAL_TABLET | Freq: Three times a day (TID) | ORAL | Status: DC | PRN

## 2015-03-25 NOTE — Progress Notes (Signed)
Re-wrote rx for tramadol-----accidentally wrote for #30---should have been #180 We gave her a small amount last month to see if it worked as well as vicodin and it did so I am happy to switch her to this for her chronic arthritic pain Rx will be left for Wynetta Emery to pick up--I am tearing old one up [name]

## 2015-04-11 ENCOUNTER — Other Ambulatory Visit: Payer: Self-pay | Admitting: Family Medicine

## 2015-05-12 ENCOUNTER — Other Ambulatory Visit: Payer: Self-pay | Admitting: Family Medicine

## 2015-06-09 ENCOUNTER — Ambulatory Visit (INDEPENDENT_AMBULATORY_CARE_PROVIDER_SITE_OTHER): Payer: Medicare Other | Admitting: *Deleted

## 2015-06-09 ENCOUNTER — Telehealth: Payer: Self-pay | Admitting: Cardiology

## 2015-06-09 DIAGNOSIS — I442 Atrioventricular block, complete: Secondary | ICD-10-CM

## 2015-06-09 NOTE — Telephone Encounter (Signed)
LMOVM reminding pt to send remote transmission.   

## 2015-06-10 DIAGNOSIS — Z96652 Presence of left artificial knee joint: Secondary | ICD-10-CM | POA: Diagnosis not present

## 2015-06-10 DIAGNOSIS — Z96651 Presence of right artificial knee joint: Secondary | ICD-10-CM | POA: Diagnosis not present

## 2015-06-10 NOTE — Progress Notes (Signed)
Remote pacemaker transmission.   

## 2015-06-28 ENCOUNTER — Other Ambulatory Visit: Payer: Self-pay | Admitting: Family Medicine

## 2015-07-10 ENCOUNTER — Encounter: Payer: Self-pay | Admitting: *Deleted

## 2015-07-14 ENCOUNTER — Other Ambulatory Visit: Payer: Self-pay | Admitting: Family Medicine

## 2015-07-30 ENCOUNTER — Other Ambulatory Visit: Payer: Self-pay | Admitting: *Deleted

## 2015-07-31 MED ORDER — HYDROCHLOROTHIAZIDE 25 MG PO TABS: 25.0000 mg | ORAL_TABLET | Freq: Every day | ORAL | Status: DC

## 2015-07-31 MED ORDER — TRAMADOL HCL 50 MG PO TABS: 100.0000 mg | ORAL_TABLET | Freq: Three times a day (TID) | ORAL | Status: DC | PRN

## 2015-07-31 MED ORDER — QUETIAPINE FUMARATE 25 MG PO TABS: 25.0000 mg | ORAL_TABLET | Freq: Every day | ORAL | Status: DC

## 2015-07-31 NOTE — Telephone Encounter (Signed)
Dear [name] Team Can u call in her tramadol with 5 refills THANKS! [name]

## 2015-07-31 NOTE — Telephone Encounter (Signed)
LMOVM for pt to return call.  Does she want her [facility], it has not been sent there previously and I just wanted to double check .Rebecca Cairns, Salome Spotted, CMA

## 2015-08-01 ENCOUNTER — Other Ambulatory Visit: Payer: Self-pay | Admitting: Family Medicine

## 2015-08-01 ENCOUNTER — Other Ambulatory Visit: Payer: Self-pay

## 2015-08-01 MED ORDER — AMLODIPINE BESYLATE 5 MG PO TABS: 5.0000 mg | ORAL_TABLET | Freq: Every day | ORAL | Status: DC

## 2015-08-01 MED ORDER — PRAVASTATIN SODIUM 40 MG PO TABS: 40.0000 mg | ORAL_TABLET | Freq: Every day | ORAL | Status: DC

## 2015-08-04 NOTE — Telephone Encounter (Signed)
LMOVM asking pt to return call to let us know where to call in her Rx. Fleeger, Salome Spotted, CMA

## 2015-08-05 NOTE — Telephone Encounter (Signed)
LVM for pt to call back to inquire about below. Zimmerman Rumple, Rigo Letts D, CMA  

## 2015-08-08 NOTE — Telephone Encounter (Signed)
lmovm for pt to return call.  Will await call to find out where to send Rx. Shaquille Janes, Salome Spotted, CMA

## 2015-08-12 NOTE — Telephone Encounter (Signed)
LVM for pt to call office. I called earlier about Rx for husband and she told me she had just picked up her tramadol at [facility]. Ottis Stain, CMA

## 2015-09-08 ENCOUNTER — Other Ambulatory Visit: Payer: Self-pay | Admitting: *Deleted

## 2015-09-08 NOTE — Telephone Encounter (Signed)
Pt husband calling saying that his wife needs refill on tramadol. Deseree Kennon Holter, CMA

## 2015-09-09 ENCOUNTER — Other Ambulatory Visit: Payer: Self-pay | Admitting: *Deleted

## 2015-09-09 MED ORDER — TRAMADOL HCL 50 MG PO TABS: 100.0000 mg | ORAL_TABLET | Freq: Three times a day (TID) | ORAL | Status: DC | PRN

## 2015-09-09 NOTE — Telephone Encounter (Signed)
Called pt home to confirm pharmacy and husband answered.  He stated [facility] and i also called this into the pharmacy and informed pt husband. Katharina Caper, April D, [place]

## 2015-09-09 NOTE — Telephone Encounter (Signed)
Dear [name] Team Please phone in [place]! [name]

## 2015-09-16 ENCOUNTER — Telehealth: Payer: Self-pay | Admitting: Family Medicine

## 2015-09-16 MED ORDER — RAMIPRIL 10 MG PO CAPS: 10.0000 mg | ORAL_CAPSULE | Freq: Two times a day (BID) | ORAL | Status: DC

## 2015-09-16 NOTE — Telephone Encounter (Signed)
Need rx for Ramipril sent to [facility]  This is located in the Pharmacy preferences directory.

## 2015-10-10 LAB — CUP PACEART REMOTE DEVICE CHECK
Battery Impedance: 158 Ohm
Battery Remaining Longevity: 124 mo
Battery Voltage: 2.79 V
Brady Statistic AP VP Percent: 11 %
Brady Statistic AS VP Percent: 87 %
Brady Statistic AS VP Percent: 89 %
Brady Statistic AS VS Percent: 1 %
Date Time Interrogation Session: 20161107175531
Implantable Lead Implant Date: 20140331
Implantable Lead Implant Date: 20140331
Implantable Lead Location: 753859
Implantable Lead Location: 753860
Implantable Lead Location: 753860
Implantable Lead Model: 5076
Implantable Lead Model: 5076
Lead Channel Impedance Value: 521 Ohm
Lead Channel Impedance Value: 525 Ohm
Lead Channel Pacing Threshold Amplitude: 0.375 V
Lead Channel Pacing Threshold Amplitude: 0.5 V
Lead Channel Pacing Threshold Amplitude: 0.875 V
Lead Channel Pacing Threshold Amplitude: 0.875 V
Lead Channel Pacing Threshold Pulse Width: 0.4 ms
Lead Channel Sensing Intrinsic Amplitude: 2.8 mV
Lead Channel Setting Pacing Amplitude: 2.5 V
Lead Channel Setting Pacing Pulse Width: 0.4 ms
Lead Channel Setting Sensing Sensitivity: 2 mV
MDC IDC LEAD IMPLANT DT: 20140331
MDC IDC LEAD IMPLANT DT: 20140331
MDC IDC LEAD LOCATION: 753859
MDC IDC MSMT BATTERY IMPEDANCE: 158 Ohm
MDC IDC MSMT BATTERY REMAINING LONGEVITY: 124 mo
MDC IDC MSMT BATTERY VOLTAGE: 2.79 V
MDC IDC MSMT LEADCHNL RA IMPEDANCE VALUE: 505 Ohm
MDC IDC MSMT LEADCHNL RA PACING THRESHOLD PULSEWIDTH: 0.4 ms
MDC IDC MSMT LEADCHNL RA PACING THRESHOLD PULSEWIDTH: 0.4 ms
MDC IDC MSMT LEADCHNL RA SENSING INTR AMPL: 2.8 mV
MDC IDC MSMT LEADCHNL RV IMPEDANCE VALUE: 525 Ohm
MDC IDC MSMT LEADCHNL RV PACING THRESHOLD PULSEWIDTH: 0.4 ms
MDC IDC SESS DTM: 20170207135240
MDC IDC SET LEADCHNL RA PACING AMPLITUDE: 2 V
MDC IDC SET LEADCHNL RA PACING AMPLITUDE: 2 V
MDC IDC SET LEADCHNL RV PACING AMPLITUDE: 2.5 V
MDC IDC SET LEADCHNL RV PACING PULSEWIDTH: 0.4 ms
MDC IDC SET LEADCHNL RV SENSING SENSITIVITY: 2 mV
MDC IDC STAT BRADY AP VP PERCENT: 12 %
MDC IDC STAT BRADY AP VS PERCENT: 0 %
MDC IDC STAT BRADY AP VS PERCENT: 0 %
MDC IDC STAT BRADY AS VS PERCENT: 1 %

## 2015-10-23 ENCOUNTER — Encounter: Payer: Self-pay | Admitting: *Deleted

## 2015-11-06 ENCOUNTER — Encounter: Payer: Self-pay | Admitting: Internal Medicine

## 2015-11-06 ENCOUNTER — Ambulatory Visit (INDEPENDENT_AMBULATORY_CARE_PROVIDER_SITE_OTHER): Payer: Medicare Other | Admitting: Internal Medicine

## 2015-11-06 VITALS — BP 163/72 | HR 71 | Ht 66.0 in | Wt 159.6 lb

## 2015-11-06 DIAGNOSIS — Z950 Presence of cardiac pacemaker: Secondary | ICD-10-CM

## 2015-11-06 DIAGNOSIS — I472 Ventricular tachycardia: Secondary | ICD-10-CM

## 2015-11-06 DIAGNOSIS — I442 Atrioventricular block, complete: Secondary | ICD-10-CM

## 2015-11-06 DIAGNOSIS — I4729 Other ventricular tachycardia: Secondary | ICD-10-CM

## 2015-11-06 LAB — CUP PACEART INCLINIC DEVICE CHECK
Battery Remaining Longevity: 124 mo
Brady Statistic AP VP Percent: 13 %
Brady Statistic AP VS Percent: 0 %
Brady Statistic AS VP Percent: 86 %
Brady Statistic AS VS Percent: 1 %
Date Time Interrogation Session: 20170706170544
Implantable Lead Implant Date: 20140331
Implantable Lead Model: 5076
Lead Channel Impedance Value: 534 Ohm
Lead Channel Pacing Threshold Amplitude: 0.5 V
Lead Channel Pacing Threshold Amplitude: 0.75 V
Lead Channel Pacing Threshold Amplitude: 0.75 V
Lead Channel Pacing Threshold Pulse Width: 0.4 ms
Lead Channel Setting Pacing Amplitude: 2.5 V
Lead Channel Setting Pacing Pulse Width: 0.4 ms
Lead Channel Setting Sensing Sensitivity: 2 mV
MDC IDC LEAD IMPLANT DT: 20140331
MDC IDC LEAD LOCATION: 753859
MDC IDC LEAD LOCATION: 753860
MDC IDC MSMT BATTERY IMPEDANCE: 158 Ohm
MDC IDC MSMT BATTERY VOLTAGE: 2.79 V
MDC IDC MSMT LEADCHNL RA IMPEDANCE VALUE: 499 Ohm
MDC IDC MSMT LEADCHNL RA PACING THRESHOLD AMPLITUDE: 0.375 V
MDC IDC MSMT LEADCHNL RA PACING THRESHOLD PULSEWIDTH: 0.4 ms
MDC IDC MSMT LEADCHNL RA PACING THRESHOLD PULSEWIDTH: 0.4 ms
MDC IDC MSMT LEADCHNL RA SENSING INTR AMPL: 4 mV
MDC IDC MSMT LEADCHNL RV PACING THRESHOLD PULSEWIDTH: 0.4 ms
MDC IDC SET LEADCHNL RA PACING AMPLITUDE: 2 V

## 2015-11-06 NOTE — Progress Notes (Signed)
Patient Care Team: Dickie La, MD as PCP - General   HPI  Shelley Black is a 82 y.o. female Seen following pacemaker implantation 3/14 for complete heart block that was complicated by pause dependent polymorphic ventricular tachycardia   She comes in today without complaints.   She denies chest pain shortness of breath. This is a little bit of edema in her extremities. It is not problematic.    Past Medical History  Diagnosis Date  . Heart murmur     not seen by cardiologist  . Hypertension   . Arthritis   . Complete heart block ([place])   . Cardiac pacemaker Medtronic     Past Surgical History  Procedure Laterality Date  . Total knee arthroplasty Left   . Thyroid surgery    . Appendectomy    . Cataract extraction, bilateral    . Total knee arthroplasty  12/24/2011    Procedure: TOTAL KNEE ARTHROPLASTY;  Surgeon: Alta Corning, MD;  Location: [place];  Service: Orthopedics;  Laterality: Right;  general anesthesia with pre op femoral nerve block  . Rotator cuff repair Right   . Left heart catheterization with coronary angiogram N/A 07/29/2012    Procedure: LEFT HEART CATHETERIZATION WITH CORONARY ANGIOGRAM;  Surgeon: Peter M Martinique, MD;  Location: [facility] CATH LAB;  Service: Cardiovascular;  Laterality: N/A;  . Temporary pacemaker insertion  07/29/2012    Procedure: TEMPORARY PACEMAKER INSERTION;  Surgeon: Peter M Martinique, MD;  Location: [facility] CATH LAB;  Service: Cardiovascular;;  . Permanent pacemaker insertion N/A 07/31/2012    Procedure: PERMANENT PACEMAKER INSERTION;  Surgeon: Deboraha Sprang, MD;  Location: [facility] CATH LAB;  Service: Cardiovascular;  Laterality: N/A;    Current Outpatient Prescriptions  Medication Sig Dispense Refill  . amLODipine (NORVASC) 5 MG tablet Take 1 tablet (5 mg total) by mouth daily. 90 tablet 0  . Cholecalciferol (VITAMIN D3) 2000 UNITS TABS Take 1 tablet by mouth daily.    . hydrochlorothiazide (HYDRODIURIL) 25 MG tablet Take 1 tablet (25 mg total)  by mouth daily. 90 tablet 3  . Multiple Vitamin (MULTIVITAMIN WITH MINERALS) TABS Take 1 tablet by mouth daily.    . Omega-3 Fatty Acids (FISH OIL TRIPLE STRENGTH PO) Take 1 capsule by mouth daily.    . ondansetron (ZOFRAN) 4 MG tablet Take 4 mg by mouth daily.    . pravastatin (PRAVACHOL) 40 MG tablet Take 1 tablet (40 mg total) by mouth daily. 90 tablet 3  . QUEtiapine (SEROQUEL) 25 MG tablet Take 1 tablet (25 mg total) by mouth at bedtime. 90 tablet 3  . ramipril (ALTACE) 10 MG capsule Take 1 capsule (10 mg total) by mouth 2 (two) times daily. 180 capsule 3  . traMADol (ULTRAM) 50 MG tablet Take 2 tablets (100 mg total) by mouth every 8 (eight) hours as needed. 180 tablet 5   No current facility-administered medications for this visit.    No Known Allergies  Review of Systems negative except from HPI and PMH  Physical Exam BP 163/72 mmHg  Pulse 71  Ht 5\' 6"  (1.676 m)  Wt 159 lb 9.6 oz (72.394 kg)  BMI 25.77 kg/m2  SpO2 94% Well developed and well nourished in no acute distress HENT normal E scleral and icterus clear Neck Supple JVP flat; carotids brisk and full Clear to ausculation Device pocket well healed; without hematoma or erythema.  There is no tethering  Regular rate and rhythm, no murmurs gallops or rub Soft  with active bowel sounds No clubbing cyanosis 1+L and  None on right Edema Alert and oriented, grossly normal motor and sensory function Skin Warm and Dry;; left leg swollen and warm compared to R   ECG demonstrates P synchronous pacing with frequent PACs  Assessment and  Plan  Complete heart block -intermittent  Polymorphic ventricular tachycardia-secondary  No intercurrent Ventricular tachycardia  Pacemaker Medtronic The patient's device was interrogated.  The information was reviewed. No changes were made in the programming.    Unilateral lower extremity edema  Hypertension  The patient's rhythm is stable. There is no evidence of ventricular  tachycardia. She is no longer device dependent. We have reprogrammed her with AV search hysteresis.  Blood pressure is  better at home and is here. She will continue to follow it up.

## 2015-11-06 NOTE — Patient Instructions (Addendum)
Medication Instructions: - Your physician recommends that you continue on your current medications as directed. Please refer to the Current Medication list given to you today.  Labwork: - none  Procedures/Testing: - none  Follow-Up: Remote monitoring is used to monitor your Pacemaker of ICD from home. This monitoring reduces the number of office visits required to check your device to one time per year. It allows [place] to keep an eye on the functioning of your device to ensure it is working properly. You are scheduled for a device check from home on 02/05/16. You may send your transmission at any time that day. If you have a wireless device, the transmission will be sent automatically. After your physician reviews your transmission, you will receive a postcard with your next transmission date.  - Your physician wants you to follow-up in: 1 year with Chanetta Marshall, NP for Dr. Caryl Comes. You will receive a reminder letter in the mail two months in advance. If you don't receive a letter, please call our office to schedule the follow-up appointment.  Any Additional Special Instructions Will Be Listed Below (If Applicable).     If you need a refill on your cardiac medications before your next appointment, please call your pharmacy.

## 2015-11-13 ENCOUNTER — Other Ambulatory Visit: Payer: Self-pay | Admitting: Internal Medicine

## 2015-11-28 ENCOUNTER — Other Ambulatory Visit: Payer: Self-pay | Admitting: Family Medicine

## 2015-12-22 ENCOUNTER — Telehealth: Payer: Self-pay | Admitting: Family Medicine

## 2015-12-22 NOTE — Telephone Encounter (Signed)
Will forward to MD to advise. Jazmin Hartsell,CMA  

## 2015-12-22 NOTE — Telephone Encounter (Signed)
Husband called.  He wants to bring his wife for blood work on Wednesday when he comes to his lab appt.  apparentoy pt was suppose to have her yearly blood work in April but didn't. Husband says he has called about this since April but no one returns his calls. There is no documentation of such requests.  He wants to know the answer by this afternoon.

## 2015-12-24 ENCOUNTER — Other Ambulatory Visit: Payer: Medicare Other

## 2015-12-24 ENCOUNTER — Other Ambulatory Visit: Payer: Self-pay | Admitting: Family Medicine

## 2015-12-24 DIAGNOSIS — E785 Hyperlipidemia, unspecified: Secondary | ICD-10-CM

## 2015-12-24 DIAGNOSIS — E049 Nontoxic goiter, unspecified: Secondary | ICD-10-CM | POA: Diagnosis not present

## 2015-12-24 DIAGNOSIS — I442 Atrioventricular block, complete: Secondary | ICD-10-CM

## 2015-12-24 LAB — COMPLETE METABOLIC PANEL WITH GFR
ALT: 15 U/L (ref 6–29)
AST: 17 U/L (ref 10–35)
Albumin: 3.8 g/dL (ref 3.6–5.1)
Alkaline Phosphatase: 48 U/L (ref 33–130)
BUN: 40 mg/dL — ABNORMAL HIGH (ref 7–25)
CHLORIDE: 101 mmol/L (ref 98–110)
CO2: 30 mmol/L (ref 20–31)
CREATININE: 1.19 mg/dL — AB (ref 0.60–0.88)
Calcium: 10.4 mg/dL (ref 8.6–10.4)
GFR, EST AFRICAN AMERICAN: 49 mL/min — AB (ref >=60)
GFR, EST NON AFRICAN AMERICAN: 42 mL/min — AB (ref >=60)
GLUCOSE: 115 mg/dL — AB (ref 65–99)
Potassium: 4.3 mmol/L (ref 3.5–5.3)
SODIUM: 140 mmol/L (ref 135–146)
Total Bilirubin: 0.3 mg/dL (ref 0.2–1.2)
Total Protein: 7 g/dL (ref 6.1–8.1)

## 2015-12-24 LAB — LIPID PANEL
Cholesterol: 165 mg/dL (ref 125–200)
HDL: 50 mg/dL (ref >=46)
LDL CALC: 79 mg/dL (ref <130)
TRIGLYCERIDES: 182 mg/dL — AB (ref <150)
Total CHOL/HDL Ratio: 3.3 Ratio (ref <=5.0)
VLDL: 36 mg/dL — AB (ref <30)

## 2015-12-24 LAB — TSH: TSH: 2.75 m[IU]/L

## 2015-12-30 ENCOUNTER — Encounter: Payer: Self-pay | Admitting: Family Medicine

## 2016-01-26 ENCOUNTER — Other Ambulatory Visit: Payer: Self-pay | Admitting: *Deleted

## 2016-01-26 MED ORDER — TRAMADOL HCL 50 MG PO TABS: 100.0000 mg | ORAL_TABLET | Freq: Three times a day (TID) | ORAL | 5 refills | Status: DC | PRN

## 2016-01-29 DIAGNOSIS — Z23 Encounter for immunization: Secondary | ICD-10-CM | POA: Diagnosis not present

## 2016-02-04 ENCOUNTER — Other Ambulatory Visit: Payer: Self-pay | Admitting: Family Medicine

## 2016-02-04 NOTE — Telephone Encounter (Signed)
I ordered refills on Sept 25---make sure she knows hat THANKS! [name] PS if for some reason they are not at the pharmacy, please call in as per rx of 01/26/16

## 2016-02-04 NOTE — Telephone Encounter (Signed)
Contacted pt and she said that she did not have that Rx, I told her I would look up front and then contact the pharmacy and then I would call the rx in if needed. Wanted to be sure the pharmacy doesn't have in on file or that it is up front to pick up. Katharina Caper, April D, [place]

## 2016-02-04 NOTE — Telephone Encounter (Signed)
PS This MAY have been about her HUSBANDS tramadol which [facility] at [facility] filled today---if so it is taken care of  THANKS! [name]

## 2016-02-04 NOTE — Telephone Encounter (Signed)
Patient came into office request refill on RX Tramadol 50 mg. Please let pt know when complete.

## 2016-02-04 NOTE — Telephone Encounter (Signed)
Also in the conversation she stated that her husband Rx was wrong, should be for 180 and it was written for 120. Routing to PCP as an Pharmacist, hospital. Katharina Caper, April D, [place]

## 2016-02-05 ENCOUNTER — Ambulatory Visit (INDEPENDENT_AMBULATORY_CARE_PROVIDER_SITE_OTHER): Payer: Medicare Other | Admitting: *Deleted

## 2016-02-05 ENCOUNTER — Telehealth: Payer: Self-pay | Admitting: Cardiology

## 2016-02-05 DIAGNOSIS — I442 Atrioventricular block, complete: Secondary | ICD-10-CM | POA: Diagnosis not present

## 2016-02-05 NOTE — Telephone Encounter (Signed)
Dear [name] Team If HER rx for tramadol is not at pharmacyplease call it in. RE HIS rx for tramadol, tell him to come see me in next 1 month anyway and I will take care of it then---he shoud use the rx Neeton sent in for hm until then. THANKS! [name]

## 2016-02-05 NOTE — Telephone Encounter (Signed)
LVm for pt to call office back to inform her of below.  Also to see which pharmacy she would like [place] to call this into.  Katharina Caper, April D, [place]

## 2016-02-05 NOTE — Progress Notes (Signed)
Remote pacemaker transmission.   

## 2016-02-05 NOTE — Telephone Encounter (Signed)
Contacted pharmacy and the Rx had been sent in and was on hold, I contacted pt and informed her of this and the below message. Katharina Caper, April D, [place]

## 2016-02-05 NOTE — Telephone Encounter (Signed)
Spoke with pt and reminded pt of remote transmission that is due today. Pt verbalized understanding.   

## 2016-02-06 ENCOUNTER — Encounter: Payer: Self-pay | Admitting: Cardiology

## 2016-02-15 LAB — CUP PACEART REMOTE DEVICE CHECK
Battery Impedance: 206 Ohm
Battery Remaining Longevity: 114 mo
Battery Voltage: 2.79 V
Brady Statistic AP VP Percent: 18 %
Brady Statistic AS VP Percent: 82 %
Date Time Interrogation Session: 20171005145154
Implantable Lead Location: 753860
Implantable Lead Model: 5076
Lead Channel Impedance Value: 546 Ohm
Lead Channel Setting Sensing Sensitivity: 2 mV
MDC IDC LEAD IMPLANT DT: 20140331
MDC IDC LEAD IMPLANT DT: 20140331
MDC IDC LEAD LOCATION: 753859
MDC IDC MSMT LEADCHNL RA IMPEDANCE VALUE: 536 Ohm
MDC IDC MSMT LEADCHNL RA PACING THRESHOLD AMPLITUDE: 0.375 V
MDC IDC MSMT LEADCHNL RA PACING THRESHOLD PULSEWIDTH: 0.4 ms
MDC IDC MSMT LEADCHNL RV PACING THRESHOLD AMPLITUDE: 0.875 V
MDC IDC MSMT LEADCHNL RV PACING THRESHOLD PULSEWIDTH: 0.4 ms
MDC IDC SET LEADCHNL RA PACING AMPLITUDE: 2 V
MDC IDC SET LEADCHNL RV PACING AMPLITUDE: 2.5 V
MDC IDC SET LEADCHNL RV PACING PULSEWIDTH: 0.4 ms
MDC IDC STAT BRADY AP VS PERCENT: 0 %
MDC IDC STAT BRADY AS VS PERCENT: 0 %

## 2016-03-18 DIAGNOSIS — H353122 Nonexudative age-related macular degeneration, left eye, intermediate dry stage: Secondary | ICD-10-CM | POA: Diagnosis not present

## 2016-03-18 DIAGNOSIS — H538 Other visual disturbances: Secondary | ICD-10-CM | POA: Diagnosis not present

## 2016-03-18 DIAGNOSIS — H5203 Hypermetropia, bilateral: Secondary | ICD-10-CM | POA: Diagnosis not present

## 2016-03-18 DIAGNOSIS — H35372 Puckering of macula, left eye: Secondary | ICD-10-CM | POA: Diagnosis not present

## 2016-03-18 DIAGNOSIS — H353111 Nonexudative age-related macular degeneration, right eye, early dry stage: Secondary | ICD-10-CM | POA: Diagnosis not present

## 2016-03-18 DIAGNOSIS — H524 Presbyopia: Secondary | ICD-10-CM | POA: Diagnosis not present

## 2016-04-09 ENCOUNTER — Other Ambulatory Visit: Payer: Self-pay

## 2016-05-06 ENCOUNTER — Ambulatory Visit (INDEPENDENT_AMBULATORY_CARE_PROVIDER_SITE_OTHER): Payer: Medicare Other | Admitting: *Deleted

## 2016-05-06 DIAGNOSIS — I442 Atrioventricular block, complete: Secondary | ICD-10-CM

## 2016-05-06 NOTE — Progress Notes (Signed)
Remote pacemaker transmission.   

## 2016-05-07 ENCOUNTER — Encounter: Payer: Self-pay | Admitting: Cardiology

## 2016-05-25 LAB — CUP PACEART REMOTE DEVICE CHECK
Battery Impedance: 206 Ohm
Brady Statistic AP VP Percent: 19 %
Brady Statistic AP VS Percent: 0 %
Brady Statistic AS VP Percent: 81 %
Brady Statistic AS VS Percent: 0 %
Date Time Interrogation Session: 20180104153857
Implantable Lead Implant Date: 20140331
Implantable Lead Location: 753859
Implantable Lead Location: 753860
Implantable Lead Model: 5076
Lead Channel Impedance Value: 582 Ohm
Lead Channel Pacing Threshold Amplitude: 0.375 V
Lead Channel Pacing Threshold Amplitude: 0.875 V
Lead Channel Pacing Threshold Pulse Width: 0.4 ms
Lead Channel Setting Pacing Amplitude: 2.5 V
Lead Channel Setting Sensing Sensitivity: 2 mV
MDC IDC LEAD IMPLANT DT: 20140331
MDC IDC MSMT BATTERY REMAINING LONGEVITY: 115 mo
MDC IDC MSMT BATTERY VOLTAGE: 2.79 V
MDC IDC MSMT LEADCHNL RA IMPEDANCE VALUE: 537 Ohm
MDC IDC MSMT LEADCHNL RV PACING THRESHOLD PULSEWIDTH: 0.4 ms
MDC IDC PG IMPLANT DT: 20140331
MDC IDC SET LEADCHNL RA PACING AMPLITUDE: 2 V
MDC IDC SET LEADCHNL RV PACING PULSEWIDTH: 0.4 ms

## 2016-07-15 ENCOUNTER — Other Ambulatory Visit: Payer: Self-pay | Admitting: *Deleted

## 2016-07-15 MED ORDER — QUETIAPINE FUMARATE 25 MG PO TABS: 25.0000 mg | ORAL_TABLET | Freq: Every day | ORAL | 3 refills | Status: DC

## 2016-07-15 NOTE — Telephone Encounter (Signed)
Additional request from for patient's pravachol.  Kelijah Towry,CMA

## 2016-07-15 NOTE — Addendum Note (Signed)
Addended by: Valerie Roys on: 07/15/2016 12:04 PM   Modules accepted: Orders

## 2016-07-16 NOTE — Telephone Encounter (Signed)
2nd request.  Martin, Tamika L, RN  

## 2016-07-19 MED ORDER — PRAVASTATIN SODIUM 40 MG PO TABS: 40.0000 mg | ORAL_TABLET | Freq: Every day | ORAL | 3 refills | Status: DC

## 2016-07-28 ENCOUNTER — Other Ambulatory Visit: Payer: Self-pay | Admitting: Family Medicine

## 2016-07-28 MED ORDER — TRAMADOL HCL 50 MG PO TABS: 100.0000 mg | ORAL_TABLET | Freq: Three times a day (TID) | ORAL | 5 refills | Status: DC | PRN

## 2016-08-02 ENCOUNTER — Other Ambulatory Visit: Payer: Self-pay | Admitting: *Deleted

## 2016-08-02 MED ORDER — HYDROCHLOROTHIAZIDE 25 MG PO TABS: 25.0000 mg | ORAL_TABLET | Freq: Every day | ORAL | 3 refills | Status: DC

## 2016-08-05 ENCOUNTER — Telehealth: Payer: Self-pay | Admitting: Cardiology

## 2016-08-05 ENCOUNTER — Ambulatory Visit (INDEPENDENT_AMBULATORY_CARE_PROVIDER_SITE_OTHER): Payer: Medicare Other | Admitting: *Deleted

## 2016-08-05 DIAGNOSIS — I442 Atrioventricular block, complete: Secondary | ICD-10-CM | POA: Diagnosis not present

## 2016-08-05 NOTE — Telephone Encounter (Signed)
Spoke with pt and reminded pt of remote transmission that is due today. Pt verbalized understanding.   

## 2016-08-06 ENCOUNTER — Encounter: Payer: Self-pay | Admitting: Cardiology

## 2016-08-06 LAB — CUP PACEART REMOTE DEVICE CHECK
Brady Statistic AP VP Percent: 20 %
Brady Statistic AP VS Percent: 0 %
Brady Statistic AS VP Percent: 80 %
Brady Statistic AS VS Percent: 0 %
Implantable Lead Implant Date: 20140331
Implantable Lead Implant Date: 20140331
Implantable Lead Location: 753860
Implantable Lead Model: 5076
Lead Channel Impedance Value: 546 Ohm
Lead Channel Impedance Value: 595 Ohm
Lead Channel Pacing Threshold Amplitude: 0.375 V
Lead Channel Pacing Threshold Amplitude: 1 V
Lead Channel Pacing Threshold Pulse Width: 0.4 ms
Lead Channel Setting Sensing Sensitivity: 2 mV
MDC IDC LEAD LOCATION: 753859
MDC IDC MSMT BATTERY IMPEDANCE: 229 Ohm
MDC IDC MSMT BATTERY REMAINING LONGEVITY: 112 mo
MDC IDC MSMT BATTERY VOLTAGE: 2.79 V
MDC IDC MSMT LEADCHNL RA PACING THRESHOLD PULSEWIDTH: 0.4 ms
MDC IDC PG IMPLANT DT: 20140331
MDC IDC SESS DTM: 20180405174905
MDC IDC SET LEADCHNL RA PACING AMPLITUDE: 2 V
MDC IDC SET LEADCHNL RV PACING AMPLITUDE: 2.5 V
MDC IDC SET LEADCHNL RV PACING PULSEWIDTH: 0.4 ms

## 2016-08-06 NOTE — Progress Notes (Signed)
Remote pacemaker transmission.   

## 2016-09-01 ENCOUNTER — Other Ambulatory Visit: Payer: Self-pay | Admitting: *Deleted

## 2016-09-01 MED ORDER — RAMIPRIL 10 MG PO CAPS: 10.0000 mg | ORAL_CAPSULE | Freq: Two times a day (BID) | ORAL | 0 refills | Status: DC

## 2016-09-28 ENCOUNTER — Telehealth: Payer: Self-pay | Admitting: Family Medicine

## 2016-09-28 NOTE — Telephone Encounter (Signed)
Will forward to PCP.  Adit Riddles L, RN  

## 2016-09-28 NOTE — Telephone Encounter (Signed)
Husband states he wants PCP to call him to discuss getting pt something for restless legs. ep

## 2016-09-29 ENCOUNTER — Ambulatory Visit (INDEPENDENT_AMBULATORY_CARE_PROVIDER_SITE_OTHER): Payer: Medicare Other | Admitting: Family Medicine

## 2016-09-29 ENCOUNTER — Encounter: Payer: Self-pay | Admitting: Family Medicine

## 2016-09-29 VITALS — BP 144/68 | HR 46 | Temp 97.6°F | Ht 65.0 in | Wt 158.2 lb

## 2016-09-29 DIAGNOSIS — I442 Atrioventricular block, complete: Secondary | ICD-10-CM | POA: Diagnosis not present

## 2016-09-29 DIAGNOSIS — E785 Hyperlipidemia, unspecified: Secondary | ICD-10-CM | POA: Diagnosis not present

## 2016-09-29 DIAGNOSIS — Z79899 Other long term (current) drug therapy: Secondary | ICD-10-CM

## 2016-09-29 DIAGNOSIS — Z96653 Presence of artificial knee joint, bilateral: Secondary | ICD-10-CM

## 2016-09-29 DIAGNOSIS — I10 Essential (primary) hypertension: Secondary | ICD-10-CM

## 2016-09-29 NOTE — Progress Notes (Signed)
    CHIEF COMPLAINT / HPI:   #1. Restless legs at night when she does not use her tramadol. She is using it rarely she doesn't have any problems. #2. Insomnia: She does find loss she takes her Seroquel. The lower dose is working quite well for her #3 joint pains: Does fairly [place] all she takes her tramadol #4. Hypertension: No problems with her medicines. Does not need refills right now. No chest pain. No shortness of breath.  REVIEW OF SYSTEMS:  See history of present illness.  OBJECTIVE:    Vital signs reviewed. GENERAL: Well-developed, well-nourished, no acute distress. CARDIOVASCULAR: Regular rate and rhythm no murmur gallop or rub LUNGS: Clear to auscultation bilaterally, no rales or wheeze. ABDOMEN: Soft positive bowel sounds NEURO: No gross focal neurological deficits. MSK: Movement of extremity x 4.    ASSESSMENT / PLAN: Please see problem oriented charting for details

## 2016-09-29 NOTE — Telephone Encounter (Signed)
Has ov tofay and will discuss then Shelley Black

## 2016-09-29 NOTE — Assessment & Plan Note (Signed)
We'll check some lab work today. No medication changes. She seems to be doing quite well.

## 2016-09-29 NOTE — Assessment & Plan Note (Signed)
Did quite well with her knee replacements however she started to have some bilateral knee pain. The pain is not as bad as before her TKR. Also having multiple other arthralgias.

## 2016-09-29 NOTE — Assessment & Plan Note (Signed)
Followed regularly by cardiology.

## 2016-09-30 LAB — COMPREHENSIVE METABOLIC PANEL
A/G RATIO: 1.3 (ref 1.2–2.2)
ALT: 20 IU/L (ref 0–32)
AST: 23 IU/L (ref 0–40)
Albumin: 4 g/dL (ref 3.5–4.7)
Alkaline Phosphatase: 57 IU/L (ref 39–117)
BILIRUBIN TOTAL: 0.2 mg/dL (ref 0.0–1.2)
BUN/Creatinine Ratio: 35 — ABNORMAL HIGH (ref 12–28)
BUN: 35 mg/dL — AB (ref 8–27)
CALCIUM: 10.7 mg/dL — AB (ref 8.7–10.3)
CHLORIDE: 101 mmol/L (ref 96–106)
CO2: 26 mmol/L (ref 18–29)
Creatinine, Ser: 1.01 mg/dL — ABNORMAL HIGH (ref 0.57–1.00)
GFR calc Af Amer: 59 mL/min/{1.73_m2} — ABNORMAL LOW (ref >59)
GFR, EST NON AFRICAN AMERICAN: 52 mL/min/{1.73_m2} — AB (ref >59)
GLUCOSE: 95 mg/dL (ref 65–99)
Globulin, Total: 3.2 g/dL (ref 1.5–4.5)
POTASSIUM: 4.5 mmol/L (ref 3.5–5.2)
Sodium: 142 mmol/L (ref 134–144)
TOTAL PROTEIN: 7.2 g/dL (ref 6.0–8.5)

## 2016-09-30 LAB — CBC
HEMATOCRIT: 39.5 % (ref 34.0–46.6)
HEMOGLOBIN: 13.1 g/dL (ref 11.1–15.9)
MCH: 30.5 pg (ref 26.6–33.0)
MCHC: 33.2 g/dL (ref 31.5–35.7)
MCV: 92 fL (ref 79–97)
Platelets: 329 10*3/uL (ref 150–379)
RBC: 4.29 x10E6/uL (ref 3.77–5.28)
RDW: 13.9 % (ref 12.3–15.4)
WBC: 9.7 10*3/uL (ref 3.4–10.8)

## 2016-09-30 LAB — LDL CHOLESTEROL, DIRECT: LDL DIRECT: 103 mg/dL — AB (ref 0–99)

## 2016-10-01 ENCOUNTER — Encounter: Payer: Self-pay | Admitting: Family Medicine

## 2016-11-08 ENCOUNTER — Ambulatory Visit (INDEPENDENT_AMBULATORY_CARE_PROVIDER_SITE_OTHER): Payer: Medicare Other | Admitting: *Deleted

## 2016-11-08 DIAGNOSIS — I442 Atrioventricular block, complete: Secondary | ICD-10-CM | POA: Diagnosis not present

## 2016-11-09 LAB — CUP PACEART REMOTE DEVICE CHECK
Battery Voltage: 2.79 V
Brady Statistic AP VS Percent: 0 %
Brady Statistic AS VS Percent: 0 %
Date Time Interrogation Session: 20180709134158
Implantable Lead Implant Date: 20140331
Implantable Lead Location: 753860
Lead Channel Pacing Threshold Pulse Width: 0.4 ms
Lead Channel Pacing Threshold Pulse Width: 0.4 ms
Lead Channel Setting Pacing Pulse Width: 0.4 ms
Lead Channel Setting Sensing Sensitivity: 2 mV
MDC IDC LEAD IMPLANT DT: 20140331
MDC IDC LEAD LOCATION: 753859
MDC IDC MSMT BATTERY IMPEDANCE: 230 Ohm
MDC IDC MSMT BATTERY REMAINING LONGEVITY: 110 mo
MDC IDC MSMT LEADCHNL RA IMPEDANCE VALUE: 498 Ohm
MDC IDC MSMT LEADCHNL RA PACING THRESHOLD AMPLITUDE: 0.375 V
MDC IDC MSMT LEADCHNL RV IMPEDANCE VALUE: 533 Ohm
MDC IDC MSMT LEADCHNL RV PACING THRESHOLD AMPLITUDE: 1 V
MDC IDC PG IMPLANT DT: 20140331
MDC IDC SET LEADCHNL RA PACING AMPLITUDE: 2 V
MDC IDC SET LEADCHNL RV PACING AMPLITUDE: 2.5 V
MDC IDC STAT BRADY AP VP PERCENT: 21 %
MDC IDC STAT BRADY AS VP PERCENT: 79 %

## 2016-11-09 NOTE — Progress Notes (Signed)
Remote pacemaker transmission.   

## 2016-11-15 ENCOUNTER — Encounter: Payer: Self-pay | Admitting: Cardiology

## 2016-11-19 ENCOUNTER — Other Ambulatory Visit: Payer: Self-pay | Admitting: Internal Medicine

## 2016-11-30 NOTE — Progress Notes (Signed)
Electrophysiology Office Note Date: 12/02/2016  ID:  Shelley Black, DOB 1931-03-21, MRN [id number]  PCP: Dickie La, MD Electrophysiologist: Caryl Comes  CC: Pacemaker follow-up  Shelley Black is a 83 y.o. female seen today for Dr Caryl Comes.  She presents today for routine electrophysiology followup.  Since last being seen in our clinic, the patient reports doing very well.  She denies chest pain, palpitations, dyspnea, PND, orthopnea, nausea, vomiting, dizziness, syncope, edema, weight gain, or early satiety.  Device History: MDT dual chamber PPM implanted 2014 for complete heart block   Past Medical History:  Diagnosis Date  . Arthritis   . Cardiac pacemaker Medtronic   . Complete heart block ([place])   . Heart murmur    not seen by cardiologist  . Hypertension    Past Surgical History:  Procedure Laterality Date  . APPENDECTOMY    . CATARACT EXTRACTION, BILATERAL    . LEFT HEART CATHETERIZATION WITH CORONARY ANGIOGRAM N/A 07/29/2012   Procedure: LEFT HEART CATHETERIZATION WITH CORONARY ANGIOGRAM;  Surgeon: Peter M Martinique, MD;  Location: [facility] CATH LAB;  Service: Cardiovascular;  Laterality: N/A;  . PERMANENT PACEMAKER INSERTION N/A 07/31/2012   Procedure: PERMANENT PACEMAKER INSERTION;  Surgeon: Deboraha Sprang, MD;  Location[facility] CATH LAB;  Service: Cardiovascular;  Laterality: N/A;  . ROTATOR CUFF REPAIR Right   . TEMPORARY PACEMAKER INSERTION  07/29/2012   Procedure: TEMPORARY PACEMAKER INSERTION;  Surgeon: Peter M Martinique, MD;  Location: [facility] CATH LAB;  Service: Cardiovascular;;  . THYROID SURGERY    . TOTAL KNEE ARTHROPLASTY Left   . TOTAL KNEE ARTHROPLASTY  12/24/2011   Procedure: TOTAL KNEE ARTHROPLASTY;  Surgeon: Alta Corning, MD;  Location: [facility];  Service: Orthopedics;  Laterality: Right;  general anesthesia with pre op femoral nerve block    Current Outpatient Prescriptions  Medication Sig Dispense Refill  . amLODipine (NORVASC) 5 MG tablet TAKE 1 TABLET DAILY 90 tablet  0  . Cholecalciferol (VITAMIN D3) 2000 UNITS TABS Take 1 tablet by mouth daily.    . hydrochlorothiazide (HYDRODIURIL) 25 MG tablet TAKE 1 TABLET BY MOUTH EVERY DAY 90 tablet 0  . Multiple Vitamin (MULTIVITAMIN WITH MINERALS) TABS Take 1 tablet by mouth daily.    . Omega-3 Fatty Acids (FISH OIL TRIPLE STRENGTH PO) Take 1 capsule by mouth daily.    . ondansetron (ZOFRAN) 4 MG tablet Take 4 mg by mouth daily.    . pravastatin (PRAVACHOL) 40 MG tablet Take 1 tablet (40 mg total) by mouth daily. 90 tablet 3  . QUEtiapine (SEROQUEL) 25 MG tablet Take 1 tablet (25 mg total) by mouth at bedtime. 90 tablet 3  . ramipril (ALTACE) 10 MG capsule Take 1 capsule (10 mg total) by mouth 2 (two) times daily. Patient should schedule an appointment for further refills. 180 capsule 0  . traMADol (ULTRAM) 50 MG tablet Take 2 tablets (100 mg total) by mouth every 8 (eight) hours as needed. 180 tablet 5   No current facility-administered medications for this visit.     Allergies:   Patient has no known allergies.   Social History: Social History   Social History  . Marital status: Married    Spouse name: N/A  . Number of children: N/A  . Years of education: N/A   Occupational History  . Not on file.   Social History Main Topics  . Smoking status: Never Smoker  . Smokeless tobacco: Never Used  . Alcohol use No  . Drug use: No  .  Sexual activity: Not on file   Other Topics Concern  . Not on file   Social History Narrative   64th anniversary    Family History: Family History  Problem Relation Age of Onset  . Heart attack Mother   . Heart attack Father   . Diabetes Neg Hx   . Cancer Neg Hx      Review of Systems: All other systems reviewed and are otherwise negative except as noted above.   Physical Exam: VS:  BP (!) 142/76   Pulse 76   Ht 5\' 6"  (1.676 m)   Wt 159 lb 12.8 oz (72.5 kg)   BMI 25.79 kg/m  , BMI Body mass index is 25.79 kg/m.  GEN- The patient is well appearing,  alert and oriented x 3 today.   HEENT: normocephalic, atraumatic; sclera clear, conjunctiva pink; hearing intact; oropharynx clear; neck supple  Lungs- Clear to ausculation bilaterally, normal work of breathing.  No wheezes, rales, rhonchi Heart- Regular rate and rhythm, no murmurs, rubs or gallops  GI- soft, non-tender, non-distended, bowel sounds present  Extremities- no clubbing, cyanosis, or edema  MS- no significant deformity or atrophy Skin- warm and dry, no rash or lesion; PPM pocket well healed Psych- euthymic mood, full affect Neuro- strength and sensation are intact  PPM Interrogation- reviewed in detail today,  See PACEART report  EKG:  EKG is ordered today. The ekg ordered today shows SR with V pacing  Recent Labs: 12/24/2015: TSH 2.75 09/29/2016: ALT 20; BUN 35; Creatinine, Ser 1.01; Hemoglobin 13.1; Platelets 329; Potassium 4.5; Sodium 142   Wt Readings from Last 3 Encounters:  12/02/16 159 lb 12.8 oz (72.5 kg)  09/29/16 158 lb 3.2 oz (71.8 kg)  11/06/15 159 lb 9.6 oz (72.4 kg)     Other studies Reviewed: Additional studies/ records that were reviewed today include: Dr Olin Pia office notes  Assessment and Plan:  1.  Complete heart block Normal PPM function See Pace Art report No changes today  2.  HTN Stable No change required today  3.  Atrial tach Seen on device interrogation today Asymptomatic Will follow    Current medicines are reviewed at length with the patient today.   The patient does not have concerns regarding her medicines.  The following changes were made today:  none  Labs/ tests ordered today include: none Orders Placed This Encounter  Procedures  . EKG 12-Lead     Disposition:   Follow up with [facility], Dr Caryl Comes 1 year     Signed, Chanetta Marshall, NP 12/02/2016 11:06 AM  [address] [place] [address] [phone] (office) [phone] (fax

## 2016-12-02 ENCOUNTER — Encounter: Payer: Self-pay | Admitting: Nurse Practitioner

## 2016-12-02 ENCOUNTER — Encounter (INDEPENDENT_AMBULATORY_CARE_PROVIDER_SITE_OTHER): Payer: Self-pay

## 2016-12-02 ENCOUNTER — Ambulatory Visit (INDEPENDENT_AMBULATORY_CARE_PROVIDER_SITE_OTHER): Payer: Medicare Other | Admitting: Nurse Practitioner

## 2016-12-02 VITALS — BP 142/76 | HR 76 | Ht 66.0 in | Wt 159.8 lb

## 2016-12-02 DIAGNOSIS — I471 Supraventricular tachycardia: Secondary | ICD-10-CM

## 2016-12-02 DIAGNOSIS — I442 Atrioventricular block, complete: Secondary | ICD-10-CM

## 2016-12-02 DIAGNOSIS — I10 Essential (primary) hypertension: Secondary | ICD-10-CM

## 2016-12-02 LAB — CUP PACEART INCLINIC DEVICE CHECK
Date Time Interrogation Session: 20180802110823
Implantable Lead Implant Date: 20140331
Implantable Lead Location: 753860
Implantable Lead Model: 5076
MDC IDC LEAD IMPLANT DT: 20140331
MDC IDC LEAD LOCATION: 753859
MDC IDC PG IMPLANT DT: 20140331

## 2016-12-02 NOTE — Patient Instructions (Addendum)
Medication Instructions: Your physician recommends that you continue on your current medications as directed. Please refer to the Current Medication list given to you today.   Labwork: None ordered  Procedures/Testing: None ordered  Follow-Up: Remote monitoring is used to monitor your Pacemaker of ICD from home. This monitoring reduces the number of office visits required to check your device to one time per year. It allows [place] to keep an eye on the functioning of your device to ensure it is working properly. You are scheduled for a device check from home on 02/07/17. You may send your transmission at any time that day. If you have a wireless device, the transmission will be sent automatically. After your physician reviews your transmission, you will receive a postcard with your next transmission date.   Your physician wants you to follow-up in: 1 year With Dr. Caryl Comes. You will receive a reminder letter in the mail two months in advance. If you don't receive a letter, please call our office to schedule the follow-up appointment.    Any Additional Special Instructions Will Be Listed Below (If Applicable).     If you need a refill on your cardiac medications before your next appointment, please call your pharmacy.

## 2016-12-21 ENCOUNTER — Other Ambulatory Visit: Payer: Self-pay | Admitting: *Deleted

## 2016-12-22 MED ORDER — RAMIPRIL 10 MG PO CAPS: 10.0000 mg | ORAL_CAPSULE | Freq: Two times a day (BID) | ORAL | 0 refills | Status: DC

## 2017-01-04 DIAGNOSIS — Z23 Encounter for immunization: Secondary | ICD-10-CM | POA: Diagnosis not present

## 2017-01-22 ENCOUNTER — Other Ambulatory Visit: Payer: Self-pay | Admitting: Family Medicine

## 2017-01-24 NOTE — Telephone Encounter (Signed)
Dear [name] Team Please call in her tramadol with 5 refills THANKS! [name]

## 2017-01-25 NOTE — Telephone Encounter (Signed)
Called in Rx. Shelley Black, Shelley Black, Shelley Black

## 2017-02-06 ENCOUNTER — Other Ambulatory Visit: Payer: Self-pay | Admitting: Internal Medicine

## 2017-02-07 ENCOUNTER — Ambulatory Visit (INDEPENDENT_AMBULATORY_CARE_PROVIDER_SITE_OTHER): Payer: Medicare Other | Admitting: *Deleted

## 2017-02-07 ENCOUNTER — Telehealth: Payer: Self-pay | Admitting: Cardiology

## 2017-02-07 DIAGNOSIS — I442 Atrioventricular block, complete: Secondary | ICD-10-CM | POA: Diagnosis not present

## 2017-02-07 NOTE — Telephone Encounter (Signed)
LMOVM reminding pt to send remote transmission.   

## 2017-02-08 NOTE — Progress Notes (Signed)
Remote pacemaker transmission.   

## 2017-02-09 LAB — CUP PACEART REMOTE DEVICE CHECK
Battery Impedance: 278 Ohm
Battery Voltage: 2.79 V
Brady Statistic AP VS Percent: 0 %
Brady Statistic AS VP Percent: 79 %
Date Time Interrogation Session: 20181008183339
Implantable Lead Implant Date: 20140331
Implantable Lead Location: 753860
Implantable Lead Model: 5076
Lead Channel Pacing Threshold Pulse Width: 0.4 ms
Lead Channel Setting Pacing Pulse Width: 0.4 ms
Lead Channel Setting Sensing Sensitivity: 2 mV
MDC IDC LEAD IMPLANT DT: 20140331
MDC IDC LEAD LOCATION: 753859
MDC IDC MSMT BATTERY REMAINING LONGEVITY: 104 mo
MDC IDC MSMT LEADCHNL RA IMPEDANCE VALUE: 512 Ohm
MDC IDC MSMT LEADCHNL RA PACING THRESHOLD AMPLITUDE: 0.375 V
MDC IDC MSMT LEADCHNL RV IMPEDANCE VALUE: 531 Ohm
MDC IDC MSMT LEADCHNL RV PACING THRESHOLD AMPLITUDE: 1.125 V
MDC IDC MSMT LEADCHNL RV PACING THRESHOLD PULSEWIDTH: 0.4 ms
MDC IDC PG IMPLANT DT: 20140331
MDC IDC SET LEADCHNL RA PACING AMPLITUDE: 2 V
MDC IDC SET LEADCHNL RV PACING AMPLITUDE: 2.5 V
MDC IDC STAT BRADY AP VP PERCENT: 21 %
MDC IDC STAT BRADY AS VS PERCENT: 0 %

## 2017-02-11 ENCOUNTER — Encounter: Payer: Self-pay | Admitting: Cardiology

## 2017-03-21 DIAGNOSIS — H353122 Nonexudative age-related macular degeneration, left eye, intermediate dry stage: Secondary | ICD-10-CM | POA: Diagnosis not present

## 2017-03-21 DIAGNOSIS — H353111 Nonexudative age-related macular degeneration, right eye, early dry stage: Secondary | ICD-10-CM | POA: Diagnosis not present

## 2017-03-21 DIAGNOSIS — H35372 Puckering of macula, left eye: Secondary | ICD-10-CM | POA: Diagnosis not present

## 2017-04-07 ENCOUNTER — Other Ambulatory Visit: Payer: Self-pay

## 2017-05-09 ENCOUNTER — Ambulatory Visit (INDEPENDENT_AMBULATORY_CARE_PROVIDER_SITE_OTHER): Payer: Medicare Other | Admitting: *Deleted

## 2017-05-09 DIAGNOSIS — I442 Atrioventricular block, complete: Secondary | ICD-10-CM

## 2017-05-09 NOTE — Progress Notes (Signed)
Remote pacemaker transmission.   

## 2017-05-10 ENCOUNTER — Encounter: Payer: Self-pay | Admitting: Cardiology

## 2017-05-10 LAB — CUP PACEART REMOTE DEVICE CHECK
Battery Impedance: 302 Ohm
Brady Statistic AP VP Percent: 21 %
Brady Statistic AP VS Percent: 0 %
Brady Statistic AS VP Percent: 79 %
Brady Statistic AS VS Percent: 0 %
Date Time Interrogation Session: 20190107150712
Implantable Lead Implant Date: 20140331
Implantable Lead Location: 753859
Implantable Lead Location: 753860
Implantable Lead Model: 5076
Lead Channel Impedance Value: 529 Ohm
Lead Channel Pacing Threshold Pulse Width: 0.4 ms
Lead Channel Pacing Threshold Pulse Width: 0.4 ms
Lead Channel Setting Pacing Amplitude: 2.5 V
MDC IDC LEAD IMPLANT DT: 20140331
MDC IDC MSMT BATTERY REMAINING LONGEVITY: 101 mo
MDC IDC MSMT BATTERY VOLTAGE: 2.78 V
MDC IDC MSMT LEADCHNL RA IMPEDANCE VALUE: 544 Ohm
MDC IDC MSMT LEADCHNL RA PACING THRESHOLD AMPLITUDE: 0.375 V
MDC IDC MSMT LEADCHNL RV PACING THRESHOLD AMPLITUDE: 1 V
MDC IDC PG IMPLANT DT: 20140331
MDC IDC SET LEADCHNL RA PACING AMPLITUDE: 2 V
MDC IDC SET LEADCHNL RV PACING PULSEWIDTH: 0.4 ms
MDC IDC SET LEADCHNL RV SENSING SENSITIVITY: 2 mV

## 2017-05-26 DIAGNOSIS — H353111 Nonexudative age-related macular degeneration, right eye, early dry stage: Secondary | ICD-10-CM | POA: Diagnosis not present

## 2017-05-26 DIAGNOSIS — H35372 Puckering of macula, left eye: Secondary | ICD-10-CM | POA: Diagnosis not present

## 2017-05-26 DIAGNOSIS — H353122 Nonexudative age-related macular degeneration, left eye, intermediate dry stage: Secondary | ICD-10-CM | POA: Diagnosis not present

## 2017-06-01 ENCOUNTER — Other Ambulatory Visit: Payer: Self-pay | Admitting: *Deleted

## 2017-06-02 MED ORDER — HYDROCHLOROTHIAZIDE 25 MG PO TABS: 25.0000 mg | ORAL_TABLET | Freq: Every day | ORAL | 0 refills | Status: DC

## 2017-06-02 MED ORDER — QUETIAPINE FUMARATE 25 MG PO TABS: 25.0000 mg | ORAL_TABLET | Freq: Every day | ORAL | 3 refills | Status: DC

## 2017-06-02 MED ORDER — PRAVASTATIN SODIUM 40 MG PO TABS: 40.0000 mg | ORAL_TABLET | Freq: Every day | ORAL | 3 refills | Status: DC

## 2017-06-13 ENCOUNTER — Other Ambulatory Visit: Payer: Self-pay | Admitting: *Deleted

## 2017-06-13 MED ORDER — RAMIPRIL 10 MG PO CAPS: 10.0000 mg | ORAL_CAPSULE | Freq: Two times a day (BID) | ORAL | 0 refills | Status: DC

## 2017-06-23 ENCOUNTER — Other Ambulatory Visit: Payer: Self-pay | Admitting: Family Medicine

## 2017-06-23 MED ORDER — TRAMADOL HCL 50 MG PO TABS: ORAL_TABLET | ORAL | 5 refills | Status: DC

## 2017-08-08 ENCOUNTER — Telehealth: Payer: Self-pay | Admitting: Cardiology

## 2017-08-08 ENCOUNTER — Ambulatory Visit (INDEPENDENT_AMBULATORY_CARE_PROVIDER_SITE_OTHER): Payer: Medicare Other | Admitting: *Deleted

## 2017-08-08 DIAGNOSIS — I442 Atrioventricular block, complete: Secondary | ICD-10-CM | POA: Diagnosis not present

## 2017-08-08 NOTE — Telephone Encounter (Signed)
Attempted to confirm remote transmission with pt. No answer and was unable to leave a message.   

## 2017-08-09 NOTE — Progress Notes (Signed)
Remote pacemaker transmission.   

## 2017-08-11 ENCOUNTER — Encounter: Payer: Self-pay | Admitting: Cardiology

## 2017-08-18 LAB — CUP PACEART REMOTE DEVICE CHECK
Battery Impedance: 326 Ohm
Battery Voltage: 2.78 V
Brady Statistic AS VS Percent: 0 %
Implantable Lead Implant Date: 20140331
Implantable Lead Location: 753859
Implantable Lead Model: 5076
Implantable Lead Model: 5076
Lead Channel Impedance Value: 505 Ohm
Lead Channel Pacing Threshold Pulse Width: 0.4 ms
Lead Channel Sensing Intrinsic Amplitude: 2.8 mV
Lead Channel Setting Pacing Amplitude: 2 V
Lead Channel Setting Pacing Amplitude: 2.5 V
Lead Channel Setting Sensing Sensitivity: 2 mV
MDC IDC LEAD IMPLANT DT: 20140331
MDC IDC LEAD LOCATION: 753860
MDC IDC MSMT BATTERY REMAINING LONGEVITY: 98 mo
MDC IDC MSMT LEADCHNL RA PACING THRESHOLD AMPLITUDE: 0.25 V
MDC IDC MSMT LEADCHNL RA PACING THRESHOLD PULSEWIDTH: 0.4 ms
MDC IDC MSMT LEADCHNL RV IMPEDANCE VALUE: 529 Ohm
MDC IDC MSMT LEADCHNL RV PACING THRESHOLD AMPLITUDE: 0.875 V
MDC IDC PG IMPLANT DT: 20140331
MDC IDC SESS DTM: 20190408174937
MDC IDC SET LEADCHNL RV PACING PULSEWIDTH: 0.4 ms
MDC IDC STAT BRADY AP VP PERCENT: 21 %
MDC IDC STAT BRADY AP VS PERCENT: 0 %
MDC IDC STAT BRADY AS VP PERCENT: 78 %

## 2017-09-09 ENCOUNTER — Other Ambulatory Visit: Payer: Self-pay

## 2017-09-13 MED ORDER — RAMIPRIL 10 MG PO CAPS: 10.0000 mg | ORAL_CAPSULE | Freq: Two times a day (BID) | ORAL | 0 refills | Status: DC

## 2017-09-21 ENCOUNTER — Other Ambulatory Visit: Payer: Self-pay

## 2017-09-21 MED ORDER — RAMIPRIL 10 MG PO CAPS
10.0000 mg | ORAL_CAPSULE | Freq: Two times a day (BID) | ORAL | 0 refills | Status: DC
Start: 2017-09-21 — End: 2017-12-09

## 2017-10-07 ENCOUNTER — Encounter: Payer: Self-pay | Admitting: Family Medicine

## 2017-10-07 ENCOUNTER — Ambulatory Visit (INDEPENDENT_AMBULATORY_CARE_PROVIDER_SITE_OTHER): Payer: Medicare Other | Admitting: Family Medicine

## 2017-10-07 VITALS — BP 124/43 | Ht 65.0 in | Wt 160.0 lb

## 2017-10-07 DIAGNOSIS — M1710 Unilateral primary osteoarthritis, unspecified knee: Secondary | ICD-10-CM | POA: Diagnosis not present

## 2017-10-07 DIAGNOSIS — IMO0002 Reserved for concepts with insufficient information to code with codable children: Secondary | ICD-10-CM

## 2017-10-10 NOTE — Progress Notes (Signed)
    CHIEF COMPLAINT / HPI: Here for follow-up of some chronic medical issues.  Her knee pain is doing well with her current medication regimen.  She is not having any problems.  She needs a couple of refills but she cannot remember what they are.  In general, life seems to be treating her well.  REVIEW OF SYSTEMS: Review of Systems  Constitutional: Negative for activity chang; no  appetite change and no unexpected weight change.  CV: No chest pain, no shortness of breath, no lower extremity edema. No change in exercise tolerance Respiratory: Negative for cough or wheezing.  No shortness of breath. Gastrointestinal: Negative for abdominal pain, no diarrhea and no  constipation.   agitation.  MSK: Arthralgias particularly in the knees as per HPI   PERTINENT  PMH / PSH: I have reviewed the patient's medications, allergies, past medical and surgical history, smoking status and updated in the EMR as appropriate.   OBJECTIVE:  Vital signs reviewed. GENERAL: Well-developed, well-nourished, no acute distress. CARDIOVASCULAR: Regular rate and rhythm no murmur gallop or rub LUNGS: Clear to auscultation bilaterally, no rales or wheeze. ABDOMEN: Soft positive bowel sounds NEURO: No gross focal neurological deficits. MSK: Movement of extremity x 4.  Both knees have full range of motion in extension and flexion.  She has some difficulty rising from the chair secondary to knee stiffness and she has an antalgic wide-based gait.  Skin: Skin around the area of each knee is without any sign of erythema or rash.  There is no unusual warmth noted.    ASSESSMENT / PLAN: Please see problem oriented charting for details

## 2017-10-18 ENCOUNTER — Other Ambulatory Visit: Payer: Self-pay | Admitting: *Deleted

## 2017-10-19 MED ORDER — HYDROCHLOROTHIAZIDE 25 MG PO TABS: 25.0000 mg | ORAL_TABLET | Freq: Every day | ORAL | 3 refills | Status: DC

## 2017-11-04 ENCOUNTER — Other Ambulatory Visit: Payer: Self-pay | Admitting: Internal Medicine

## 2017-11-07 ENCOUNTER — Ambulatory Visit (INDEPENDENT_AMBULATORY_CARE_PROVIDER_SITE_OTHER): Payer: Medicare Other | Admitting: *Deleted

## 2017-11-07 DIAGNOSIS — I442 Atrioventricular block, complete: Secondary | ICD-10-CM

## 2017-11-08 NOTE — Progress Notes (Signed)
Remote pacemaker transmission.   

## 2017-11-18 LAB — CUP PACEART REMOTE DEVICE CHECK
Battery Impedance: 375 Ohm
Battery Remaining Longevity: 93 mo
Brady Statistic AP VP Percent: 24 %
Brady Statistic AP VS Percent: 0 %
Brady Statistic AS VS Percent: 0 %
Date Time Interrogation Session: 20190708125040
Implantable Lead Implant Date: 20140331
Implantable Lead Implant Date: 20140331
Implantable Lead Location: 753859
Implantable Lead Model: 5076
Lead Channel Impedance Value: 515 Ohm
Lead Channel Pacing Threshold Amplitude: 1 V
Lead Channel Pacing Threshold Pulse Width: 0.4 ms
Lead Channel Setting Pacing Amplitude: 2 V
Lead Channel Setting Pacing Amplitude: 2.5 V
MDC IDC LEAD LOCATION: 753860
MDC IDC MSMT BATTERY VOLTAGE: 2.78 V
MDC IDC MSMT LEADCHNL RA IMPEDANCE VALUE: 512 Ohm
MDC IDC MSMT LEADCHNL RA PACING THRESHOLD AMPLITUDE: 0.375 V
MDC IDC MSMT LEADCHNL RV PACING THRESHOLD PULSEWIDTH: 0.4 ms
MDC IDC PG IMPLANT DT: 20140331
MDC IDC SET LEADCHNL RV PACING PULSEWIDTH: 0.4 ms
MDC IDC SET LEADCHNL RV SENSING SENSITIVITY: 2 mV
MDC IDC STAT BRADY AS VP PERCENT: 76 %

## 2017-11-24 ENCOUNTER — Encounter (HOSPITAL_COMMUNITY): Payer: Self-pay

## 2017-11-24 ENCOUNTER — Emergency Department (HOSPITAL_COMMUNITY): Payer: Medicare Other

## 2017-11-24 ENCOUNTER — Other Ambulatory Visit: Payer: Self-pay

## 2017-11-24 ENCOUNTER — Emergency Department (HOSPITAL_COMMUNITY)
Admission: EM | Admit: 2017-11-24 | Discharge: 2017-11-25 | Disposition: A | Discharge status: Home / Self Care | Payer: Medicare Other | Attending: Emergency Medicine | Admitting: Emergency Medicine

## 2017-11-24 DIAGNOSIS — I10 Essential (primary) hypertension: Secondary | ICD-10-CM | POA: Diagnosis not present

## 2017-11-24 DIAGNOSIS — R51 Headache: Secondary | ICD-10-CM

## 2017-11-24 DIAGNOSIS — G43109 Migraine with aura, not intractable, without status migrainosus: Secondary | ICD-10-CM | POA: Diagnosis not present

## 2017-11-24 DIAGNOSIS — R519 Headache, unspecified: Secondary | ICD-10-CM

## 2017-11-24 DIAGNOSIS — H538 Other visual disturbances: Secondary | ICD-10-CM | POA: Diagnosis present

## 2017-11-24 DIAGNOSIS — H353111 Nonexudative age-related macular degeneration, right eye, early dry stage: Secondary | ICD-10-CM | POA: Diagnosis not present

## 2017-11-24 DIAGNOSIS — H353122 Nonexudative age-related macular degeneration, left eye, intermediate dry stage: Secondary | ICD-10-CM | POA: Diagnosis not present

## 2017-11-24 DIAGNOSIS — Z79899 Other long term (current) drug therapy: Secondary | ICD-10-CM | POA: Insufficient documentation

## 2017-11-24 DIAGNOSIS — H53461 Homonymous bilateral field defects, right side: Secondary | ICD-10-CM | POA: Diagnosis not present

## 2017-11-24 DIAGNOSIS — H35372 Puckering of macula, left eye: Secondary | ICD-10-CM | POA: Diagnosis not present

## 2017-11-24 DIAGNOSIS — I6523 Occlusion and stenosis of bilateral carotid arteries: Secondary | ICD-10-CM | POA: Diagnosis not present

## 2017-11-24 LAB — COMPREHENSIVE METABOLIC PANEL
ALBUMIN: 3.9 g/dL (ref 3.5–5.0)
ALT: 23 U/L (ref 0–44)
AST: 27 U/L (ref 15–41)
Alkaline Phosphatase: 49 U/L (ref 38–126)
Anion gap: 12 (ref 5–15)
BILIRUBIN TOTAL: 0.4 mg/dL (ref 0.3–1.2)
BUN: 31 mg/dL — AB (ref 8–23)
CO2: 27 mmol/L (ref 22–32)
CREATININE: 1.24 mg/dL — AB (ref 0.44–1.00)
Calcium: 10.1 mg/dL (ref 8.9–10.3)
Chloride: 97 mmol/L — ABNORMAL LOW (ref 98–111)
GFR calc Af Amer: 45 mL/min — ABNORMAL LOW (ref >60)
GFR calc non Af Amer: 39 mL/min — ABNORMAL LOW (ref >60)
Glucose, Bld: 109 mg/dL — ABNORMAL HIGH (ref 70–99)
POTASSIUM: 3.9 mmol/L (ref 3.5–5.1)
Sodium: 136 mmol/L (ref 135–145)
TOTAL PROTEIN: 7.7 g/dL (ref 6.5–8.1)

## 2017-11-24 LAB — I-STAT CHEM 8, ED
BUN: 33 mg/dL — AB (ref 8–23)
CHLORIDE: 97 mmol/L — AB (ref 98–111)
Calcium, Ion: 1.25 mmol/L (ref 1.15–1.40)
Creatinine, Ser: 1.3 mg/dL — ABNORMAL HIGH (ref 0.44–1.00)
Glucose, Bld: 106 mg/dL — ABNORMAL HIGH (ref 70–99)
HEMATOCRIT: 41 % (ref 36.0–46.0)
Hemoglobin: 13.9 g/dL (ref 12.0–15.0)
Potassium: 3.9 mmol/L (ref 3.5–5.1)
SODIUM: 135 mmol/L (ref 135–145)
TCO2: 27 mmol/L (ref 22–32)

## 2017-11-24 LAB — DIFFERENTIAL
Abs Immature Granulocytes: 0 10*3/uL (ref 0.0–0.1)
Basophils Absolute: 0.1 10*3/uL (ref 0.0–0.1)
Basophils Relative: 1 %
Eosinophils Absolute: 0.5 10*3/uL (ref 0.0–0.7)
Eosinophils Relative: 5 %
IMMATURE GRANULOCYTES: 0 %
LYMPHS ABS: 3.3 10*3/uL (ref 0.7–4.0)
LYMPHS PCT: 30 %
MONOS PCT: 9 %
Monocytes Absolute: 1 10*3/uL (ref 0.1–1.0)
NEUTROS ABS: 5.9 10*3/uL (ref 1.7–7.7)
Neutrophils Relative %: 55 %

## 2017-11-24 LAB — I-STAT TROPONIN, ED: Troponin i, poc: 0.01 ng/mL (ref 0.00–0.08)

## 2017-11-24 LAB — PROTIME-INR
INR: 1
Prothrombin Time: 13.1 seconds (ref 11.4–15.2)

## 2017-11-24 LAB — CBC
HEMATOCRIT: 41.6 % (ref 36.0–46.0)
Hemoglobin: 13.5 g/dL (ref 12.0–15.0)
MCH: 30.3 pg (ref 26.0–34.0)
MCHC: 32.5 g/dL (ref 30.0–36.0)
MCV: 93.3 fL (ref 78.0–100.0)
Platelets: 392 10*3/uL (ref 150–400)
RBC: 4.46 MIL/uL (ref 3.87–5.11)
RDW: 13.2 % (ref 11.5–15.5)
WBC: 10.7 10*3/uL — AB (ref 4.0–10.5)

## 2017-11-24 LAB — APTT: aPTT: 29 seconds (ref 24–36)

## 2017-11-24 MED ORDER — IOPAMIDOL (ISOVUE-370) INJECTION 76%
50.0000 mL | Freq: Once | INTRAVENOUS | Status: AC | PRN
  Administered 2017-11-24: 50 mL via INTRAVENOUS

## 2017-11-24 MED ORDER — IOPAMIDOL (ISOVUE-370) INJECTION 76%
INTRAVENOUS | Status: AC
  Filled 2017-11-24: qty 50

## 2017-11-24 NOTE — ED Provider Notes (Signed)
[facility] EMERGENCY DEPARTMENT Provider Note   CSN: [id number] Arrival date & time: 11/24/17  1530     History   Chief Complaint Chief Complaint  Patient presents with  . Headache  . Visual Field Change    HPI Shelley Black is a 84 y.o. female.  Patient presents to the emergency department with a chief complaint of intermittent blurred/loss of vision.  She states that she has been having the symptoms for the past 3 to 4 days.  She states that when she is looking straight ahead, there is a small area in her peripheral vision that cuts in and out.  She states that she was seen by her ophthalmologist today who was concerned about stroke and sent her to the emergency department for evaluation.  Patient reports having had intermittent headaches for the past 3 to 4 days.  She denies having headache now.  She denies any fevers or chills.  Denies any numbness, weakness, tingling.  Denies any slurred speech.  She denies ever having had a stroke before.  She denies any other associated symptoms.  The history is provided by the patient. No language interpreter was used.    Past Medical History:  Diagnosis Date  . Arthritis   . Cardiac pacemaker Medtronic   . Complete heart block ([place])   . Heart murmur    not seen by cardiologist  . Hypertension     Patient Active Problem List   Diagnosis Date Noted  . Nausea without vomiting 08/23/2014  . Cardiac pacemaker Medtronic   . Complete heart block ([place]) 07/29/2012  . Cardiac arrest ([place]) 07/29/2012  . Rotator cuff tear 06/21/2012  . S/P TKR (total knee replacement) 12/24/2011  . INSOMNIA 07/16/2009  . OVARIAN CYSTIC MASS 01/31/2009  . Goiter 08/21/2007  . CAROTID ARTERY DISEASE 06/21/2007  . Osteoarthrosis involving lower leg 03/22/2007  . Benign neoplasm of colon 10/31/2006  . Hyperlipidemia 10/31/2006  . Essential hypertension 10/31/2006    Past Surgical History:  Procedure Laterality Date  .  APPENDECTOMY    . CATARACT EXTRACTION, BILATERAL    . LEFT HEART CATHETERIZATION WITH CORONARY ANGIOGRAM N/A 07/29/2012   Procedure: LEFT HEART CATHETERIZATION WITH CORONARY ANGIOGRAM;  Surgeon: Peter M Martinique, MD;  Location: [facility] CATH LAB;  Service: Cardiovascular;  Laterality: N/A;  . PERMANENT PACEMAKER INSERTION N/A 07/31/2012   Procedure: PERMANENT PACEMAKER INSERTION;  Surgeon: Deboraha Sprang, MD;  Location: [facility] CATH LAB;  Service: Cardiovascular;  Laterality: N/A;  . ROTATOR CUFF REPAIR Right   . TEMPORARY PACEMAKER INSERTION  07/29/2012   Procedure: TEMPORARY PACEMAKER INSERTION;  Surgeon: Peter M Martinique, MD;  Location: [facility] CATH LAB;  Service: Cardiovascular;;  . THYROID SURGERY    . TOTAL KNEE ARTHROPLASTY Left   . TOTAL KNEE ARTHROPLASTY  12/24/2011   Procedure: TOTAL KNEE ARTHROPLASTY;  Surgeon: Alta Corning, MD;  Location: [facility];  Service: Orthopedics;  Laterality: Right;  general anesthesia with pre op femoral nerve block     OB History   None      Home Medications    Prior to Admission medications   Medication Sig Start Date End Date Taking? Authorizing Provider  amLODipine (NORVASC) 5 MG tablet TAKE 1 TABLET DAILY 11/19/16   Deboraha Sprang, MD  amLODipine (NORVASC) 5 MG tablet Take 1 tablet (5 mg total) by mouth daily. 11/04/17   Deboraha Sprang, MD  Cholecalciferol (VITAMIN D3) 2000 UNITS TABS Take 1 tablet by mouth daily.  [provider]  hydrochlorothiazide (HYDRODIURIL) 25 MG tablet Take 1 tablet (25 mg total) by mouth daily. 10/19/17   Dickie La, MD  Multiple Vitamin (MULTIVITAMIN WITH MINERALS) TABS Take 1 tablet by mouth daily.    [provider]  Omega-3 Fatty Acids (FISH OIL TRIPLE STRENGTH PO) Take 1 capsule by mouth daily.    [provider]  ondansetron (ZOFRAN) 4 MG tablet Take 4 mg by mouth daily.    [provider]  pravastatin (PRAVACHOL) 40 MG tablet Take 1 tablet (40 mg total) by mouth daily. 06/02/17   Dickie La, MD    QUEtiapine (SEROQUEL) 25 MG tablet Take 1 tablet (25 mg total) by mouth at bedtime. 06/02/17   Dickie La, MD  ramipril (ALTACE) 10 MG capsule Take 1 capsule (10 mg total) by mouth 2 (two) times daily. Patient should schedule an appointment for further refills. 09/21/17   Dickie La, MD  traMADol Veatrice Bourbon) 50 MG tablet TAKE 2 TABLETS BY MOUTH EVERY 8 HOURS AS NEEDED 06/23/17   Dickie La, MD    Family History Family History  Problem Relation Age of Onset  . Heart attack Mother   . Heart attack Father   . Diabetes Neg Hx   . Cancer Neg Hx     Social History Social History   Tobacco Use  . Smoking status: Never Smoker  . Smokeless tobacco: Never Used  Substance Use Topics  . Alcohol use: No  . Drug use: No     Allergies   Patient has no known allergies.   Review of Systems Review of Systems  All other systems reviewed and are negative.    Physical Exam Updated Vital Signs BP (!) 171/60   Pulse 72   Temp 97.9 F (36.6 C) (Oral)   Resp 13   Ht 5\' 5"  (1.651 m)   Wt 68 kg (150 lb)   SpO2 97%   BMI 24.96 kg/m   Physical Exam  Constitutional: She is oriented to person, place, and time. She appears well-developed and well-nourished.  HENT:  Head: Normocephalic and atraumatic.  Eyes: Pupils are equal, round, and reactive to light. Conjunctivae and EOM are normal.  Neck: Normal range of motion. Neck supple.  Cardiovascular: Normal rate and regular rhythm. Exam reveals no gallop and no friction rub.  No murmur heard. Pulmonary/Chest: Effort normal and breath sounds normal. No respiratory distress. She has no wheezes. She has no rales. She exhibits no tenderness.  Abdominal: Soft. Bowel sounds are normal. She exhibits no distension and no mass. There is no tenderness. There is no rebound and no guarding.  Musculoskeletal: Normal range of motion. She exhibits no edema or tenderness.  Neurological: She is alert and oriented to person, place, and time.  CN III-XII  intact, speech is clear, movements are goal oriented  Skin: Skin is warm and dry.  Psychiatric: She has a normal mood and affect. Her behavior is normal. Judgment and thought content normal.  Nursing note and vitals reviewed.    ED Treatments / Results  Labs (all labs ordered are listed, but only abnormal results are displayed) Labs Reviewed  CBC - Abnormal; Notable for the following components:      Result Value   WBC 10.7 (*)    All other components within normal limits  COMPREHENSIVE METABOLIC PANEL - Abnormal; Notable for the following components:   Chloride 97 (*)    Glucose, Bld 109 (*)    BUN 31 (*)  Creatinine, Ser 1.24 (*)    GFR calc non Af Amer 39 (*)    GFR calc Af Amer 45 (*)    All other components within normal limits  I-STAT CHEM 8, ED - Abnormal; Notable for the following components:   Chloride 97 (*)    BUN 33 (*)    Creatinine, Ser 1.30 (*)    Glucose, Bld 106 (*)    All other components within normal limits  PROTIME-INR  APTT  DIFFERENTIAL  I-STAT TROPONIN, ED  CBG MONITORING, ED    EKG EKG Interpretation  Date/Time:  Thursday November 24 2017 15:37:53 EDT Ventricular Rate:  73 PR Interval:  170 QRS Duration: 168 QT Interval:  450 QTC Calculation: 495 R Axis:   -79 Text Interpretation:  Electronic ventricular pacemaker No significant change since last tracing Confirmed by Isla Pence [phone]) on 11/24/2017 9:39:09 PM   Radiology Ct Head Wo Contrast  Result Date: 11/24/2017 CLINICAL DATA:  Headache and visual disturbance for the past 4 days. Visual field change in the right eye. EXAM: CT HEAD WITHOUT CONTRAST TECHNIQUE: Contiguous axial images were obtained from the base of the skull through the vertex without intravenous contrast. COMPARISON:  None. FINDINGS: Brain: Diffusely enlarged ventricles and subarachnoid spaces. Patchy white matter low density in both cerebral hemispheres. No intracranial hemorrhage, mass lesion or CT evidence of acute  infarction. Vascular: No hyperdense vessel or unexpected calcification. Skull: Normal. Negative for fracture or focal lesion. Sinuses/Orbits: Status post bilateral cataract extraction. Unremarkable paranasal sinuses. Other: None. IMPRESSION: 1. No acute abnormality. 2. Mild diffuse cerebral and cerebellar atrophy. 3. Mild to moderate chronic small vessel white matter ischemic changes in both cerebral hemispheres. Electronically Signed   By: Claudie Revering M.D.   On: 11/24/2017 16:49    Procedures Procedures (including critical care time)  Medications Ordered in ED Medications  iopamidol (ISOVUE-370) 76 % injection (has no administration in time range)  iopamidol (ISOVUE-370) 76 % injection 50 mL (has no administration in time range)     Initial Impression / Assessment and Plan / ED Course  I have reviewed the triage vital signs and the nursing notes.  Pertinent labs & imaging results that were available during my care of the patient were reviewed by me and considered in my medical decision making (see chart for details).     Patient with intermittent vision loss and right eye peripheral field.  Not noticeable on my exam, but patient states that earlier in the week she cannot see her daughter's face with her right eye.  She was seen by her ophthalmologist, who was concerned about stroke, and sent her to the emergency department for evaluation.  Screening CT was markable for cerebellar and cerebral atrophy, but no evidence of acute stroke.  MRI is contraindicated given patient's pacemaker make/model.  Will check CT angios head and cervical.  Anticipate consultation with neurology.  CT shows no emergent large vessel occlusion or high-grade intracranial stenosis.  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  I discussed the patient with Dr. Leonel Ramsay, who has also seen and evaluated the patient.  He believes the patient's visual complaints to be migraine aura.     Recommends also starting aspirin low-dose daily for the narrowing of the left vertebral artery.  Recommends outpatient neurology follow-up.  Patient understands and agrees with the plan.  She is stable and ready for discharge.  Final Clinical Impressions(s) / ED Diagnoses   Final diagnoses:  Nonintractable  headache, unspecified chronicity pattern, unspecified headache type  Migraine with aura and without status migrainosus, not intractable    ED Discharge Orders        Ordered    aspirin 81 MG tablet  Daily     11/25/17 0036    Ambulatory referral to Neurology    Comments:  An appointment is requested in approximately: 2 weeks   11/25/17 0036       Montine Circle, PA-C 11/25/17 0201    Isla Pence, MD 11/25/17 1729

## 2017-11-24 NOTE — ED Notes (Signed)
Pt's family member asking about wait time. 

## 2017-11-24 NOTE — ED Triage Notes (Signed)
Pt endorses headache and seeing spots that began Monday. Pt went to eye dr today and sent here to be evaluated for stroke. Pt has visual field change in right eye, has hx of eye problems. No weakness, drift, slurred speech or facial droop. Headache is resolved.

## 2017-11-24 NOTE — ED Provider Notes (Signed)
MSE was initiated and I personally evaluated the patient and placed orders (if any) at  3:45 PM on November 24, 2017.  The patient appears stable so that the remainder of the MSE may be completed by another provider.  Patient placed in Quick Look pathway, seen and evaluated   Chief Complaint: Vision changes  HPI:   A 84-year-old female with a past medical history of hypertension, hyperlipidemia who presents to ED for evaluation of vision changes and headache that began Monday.  States that she had a severe headache on Monday which gradually resolved.  States that she kept seeing "spots" in her vision on Monday that are still present today but not as severe.  She saw her eye doctor today and was sent here for stroke work-up.  Denies history of stroke.  Denies any head injury or blood thinner use.  States that she has had gradual vision changes in her right eye greater than the left eye for the past several years.  The only thing that is new is the spots in her vision.  ROS: Vision changes  Physical Exam:   Gen: No distress  Neuro: Awake and Alert  Skin: Warm    Focused Exam: No facial asymmetry noted. Pupils minimally reactive. Strength 5/5 in bilateral upper extremities. Patient reports visual field change in R eye.   Initiation of care has begun. The patient has been counseled on the process, plan, and necessity for staying for the completion/evaluation, and the remainder of the medical screening examination    Delia Heady, PA-C 11/24/17 [place]    Isla Pence, MD 11/24/17 1734

## 2017-11-25 ENCOUNTER — Encounter: Payer: Self-pay | Admitting: Neurology

## 2017-11-25 DIAGNOSIS — G43109 Migraine with aura, not intractable, without status migrainosus: Secondary | ICD-10-CM | POA: Diagnosis not present

## 2017-11-25 MED ORDER — ASPIRIN 81 MG PO TABS: 81.0000 mg | ORAL_TABLET | Freq: Every day | ORAL | 0 refills | Status: DC

## 2017-11-25 NOTE — Discharge Instructions (Signed)
Your CT imaging and tests show no evidence of stroke.  The neurologist recommends that you follow-up on an outpatient basis.  I have put in a referral for you.  You will also need to take a baby aspirin daily due to some stenosis seen on the CT of your neck.

## 2017-11-25 NOTE — Consult Note (Signed)
Neurology Consultation Reason for Consult: Vision changes Referring Physician: Marlon Pel, R  CC: Vision changes  History is obtained from: Patient  HPI: Shelley Black is a 84 y.o. female with a long-standing history of migraines with visual aura had a particularly bad migraine on Monday. She states that the pain was fairly severe, associated with photophobia and she had vision changes which were characteristic of her previous headaches.  Given out that the migraine was, she wondered if eye changes were contributing. To this effect she saw her ophthalmologist today who was concerned for possible TIA/stroke and therefore referred her to the emergency department.  She describes the visual changes as being  Zigzag lines as well as floating spots.  She denies any numbness, weakness, diplopia, nausea, vomiting, vertigo, other symptoms.  ROS: A 14 point ROS was performed and is negative except as noted in the HPI.   Past Medical History:  Diagnosis Date  . Arthritis   . Cardiac pacemaker Medtronic   . Complete heart block ([place])   . Heart murmur    not seen by cardiologist  . Hypertension      Family History  Problem Relation Age of Onset  . Heart attack Mother   . Heart attack Father   . Diabetes Neg Hx   . Cancer Neg Hx      Social History:  reports that she has never smoked. She has never used smokeless tobacco. She reports that she does not drink alcohol or use drugs.   Exam: Current vital signs: BP (!) 151/65   Pulse 70   Temp 98 F (36.7 C)   Resp 14   Ht 5\' 5"  (1.651 m)   Wt 68 kg (150 lb)   SpO2 97%   BMI 24.96 kg/m  Vital signs in last 24 hours: Temp:  [97.9 F (36.6 C)-98 F (36.7 C)] 98 F (36.7 C) (07/26 0008) Pulse Rate:  [70-86] 70 (07/25 2330) Resp:  [13-18] 14 (07/26 0043) BP: (151-192)/(50-76) 151/65 (07/26 0043) SpO2:  [95 %-98 %] 97 % (07/25 2330) Weight:  [68 kg (150 lb)] 68 kg (150 lb) (07/25 1535)   Physical Exam  Constitutional:  Appears well-developed and well-nourished.  Psych: Affect appropriate to situation Eyes: No scleral injection HENT: No OP obstrucion Head: Normocephalic.  Cardiovascular: Normal rate and regular rhythm.  Respiratory: Effort normal, non-labored breathing GI: Soft.  No distension. There is no tenderness.  Skin: WDI  Neuro: Mental Status: Patient is awake, alert, oriented to person, place, month, year, and situation. Patient is able to give a clear and coherent history. No signs of aphasia or neglect Cranial Nerves: II: Visual Fields are full. Pupils are equal, round, and reactive to light.   III,IV, VI: EOMI without ptosis or diploplia.  V: Facial sensation is symmetric to temperature VII: Facial movement is symmetric.  VIII: hearing is intact to voice X: Uvula elevates symmetrically XI: Shoulder shrug is symmetric. XII: tongue is midline without atrophy or fasciculations.  Motor: Tone is normal. Bulk is normal. 5/5 strength was present in all four extremities.  Sensory: Sensation is symmetric to light touch and temperature in the arms and legs. Cerebellar: FNF intact bilaterally  I have reviewed labs in epic and the results pertinent to this consultation are: CMP-unremarkable  I have reviewed the images obtained: CT head-no acute stroke CTA vertebral stenosis  Impression: 84 year old female with a history of migraines with visual aura who presents with complaints most consistent with migraine visual aura. She does have severe  left vertebral artery stenosis and for that reason I would recommend her going on aspirin therapy, but I think it is incidental.   Recommendations: 1) ASA 81 mg daily 2) follow-up with outpatient neurology for consideration of prophylactic therapy.   Roland Rack, MD [facility] [phone]  If 7pm- 7am, please page neurology on call as listed in [place].

## 2017-11-25 NOTE — ED Notes (Signed)
Dr. Kirkpatrick at bedside 

## 2017-11-28 ENCOUNTER — Telehealth: Payer: Self-pay | Admitting: *Deleted

## 2017-11-28 NOTE — Telephone Encounter (Signed)
Pt wanted to let Dr. Nori Riis know that they have an appt with Dr. Tomi Likens tomorrow, so they will cancel the appt with Dr. Nori Riis for Wednesday.  Fleeger, Salome Spotted, CMA

## 2017-11-29 ENCOUNTER — Encounter: Payer: Self-pay | Admitting: Neurology

## 2017-11-29 ENCOUNTER — Ambulatory Visit (INDEPENDENT_AMBULATORY_CARE_PROVIDER_SITE_OTHER): Payer: Medicare Other | Admitting: Neurology

## 2017-11-29 VITALS — BP 108/66 | HR 88 | Ht 65.0 in | Wt 159.0 lb

## 2017-11-29 DIAGNOSIS — Z79899 Other long term (current) drug therapy: Secondary | ICD-10-CM | POA: Diagnosis not present

## 2017-11-29 DIAGNOSIS — G4453 Primary thunderclap headache: Secondary | ICD-10-CM

## 2017-11-29 DIAGNOSIS — H539 Unspecified visual disturbance: Secondary | ICD-10-CM | POA: Diagnosis not present

## 2017-11-29 DIAGNOSIS — G4440 Drug-induced headache, not elsewhere classified, not intractable: Secondary | ICD-10-CM | POA: Diagnosis not present

## 2017-11-29 MED ORDER — TOPIRAMATE 25 MG PO TABS: 25.0000 mg | ORAL_TABLET | Freq: Every day | ORAL | 3 refills | Status: DC

## 2017-11-29 NOTE — Patient Instructions (Addendum)
1.  We will start topiramate (Topamax) 25mg  at bedtime.  Possible side effects include: impaired thinking, sedation, paresthesias (numbness and tingling) and weight loss.  It may cause dehydration and there is a small risk for kidney stones, so make sure to stay hydrated with water during the day.  There is also a very small risk for glaucoma, so if you notice any change in your vision while taking this medication, see an ophthalmologist.   2.  Limit use of pain relievers (Excedrin, tramadol) to no more than 2 days out of the week to prevent rebound headache 3.  Keep headache diary 4.  Since we don't know for sure what happened, take aspirin 81mg  daily. 5. We will check a BMP. 6.  Follow up in 4 months.

## 2017-11-29 NOTE — Progress Notes (Signed)
NEUROLOGY CONSULTATION NOTE  Shelley Black MRN: [id number] DOB: 1930/07/03  Referring provider: ED referral Primary care provider: [name], MD  Reason for consult:  Headache, visual disturbance  HISTORY OF PRESENT ILLNESS: Shelley Black is an 84 year old right-handed female with complete heart block s/p pacemaker Medtronic, hypertension and arthritis who presents for headache.  She is accompanied by her husband and daughter who supplements history.  History also supplemented by ED note.  On the morning of 11/21/17, she developed a sudden onset severe thunderclap headache on the top of her head.  She had a persistent headache lasting until the following morning.  She did not have nausea, vomiting, photophobia, phonophobia or unilateral numbness or weakness.  She reportedly had mild slurred speech.  Most prominent, she saw a gray spot in the center of her vision involving both eyes.  She saw her ophthalmologist on 11/24/17 who sent her to the ED for suspected stroke.  .  CT of head without contrast was personally reviewed and demonstrated mild to moderate chronic small vessel ischemic changes but no acute findings.  CTA of head and neck personally reviewed and demonstrated no large vessel occlusion or high-grade stenosis.  Although focal severe narrowing was seen in the left vertebral artery at C5 level, possibly due to mass effect from adjacent facet and uncovertebral hypertrophy.  Migraine aura was suspected.  She continues to have intermittent visual disturbance involving gray spots, brief and lasting throughout the day.  She no longer has had the severe headache but continues to have her daily habitual headache that she has experienced since young adulthood.  It is a dull bifrontal non-throbbing headache with no associated symptoms.  She wakes up with it and treats it every morning with Excedrin, which resolves it in 30 minutes.  She also takes tramadol twice daily.  11/24/17 BMP:  Na  135, K 3.9, glucose 106, BUN 33, Cr 1.30  PAST MEDICAL HISTORY: Past Medical History:  Diagnosis Date  . Arthritis   . Cardiac pacemaker Medtronic   . Complete heart block ([place])   . Heart murmur    not seen by cardiologist  . Hypertension     PAST SURGICAL HISTORY: Past Surgical History:  Procedure Laterality Date  . APPENDECTOMY    . CATARACT EXTRACTION, BILATERAL    . LEFT HEART CATHETERIZATION WITH CORONARY ANGIOGRAM N/A 07/29/2012   Procedure: LEFT HEART CATHETERIZATION WITH CORONARY ANGIOGRAM;  Surgeon: Peter M Martinique, MD;  Location: [facility] CATH LAB;  Service: Cardiovascular;  Laterality: N/A;  . PERMANENT PACEMAKER INSERTION N/A 07/31/2012   Procedure: PERMANENT PACEMAKER INSERTION;  Surgeon: Deboraha Sprang, MD;  Location: [facility] CATH LAB;  Service: Cardiovascular;  Laterality: N/A;  . ROTATOR CUFF REPAIR Right   . TEMPORARY PACEMAKER INSERTION  07/29/2012   Procedure: TEMPORARY PACEMAKER INSERTION;  Surgeon: Peter M Martinique, MD;  Location[facility] CATH LAB;  Service: Cardiovascular;;  . THYROID SURGERY    . TOTAL KNEE ARTHROPLASTY Left   . TOTAL KNEE ARTHROPLASTY  12/24/2011   Procedure: TOTAL KNEE ARTHROPLASTY;  Surgeon: Alta Corning, MD;  Location: [place];  Service: Orthopedics;  Laterality: Right;  general anesthesia with pre op femoral nerve block    MEDICATIONS: Current Outpatient Medications on File Prior to Visit  Medication Sig Dispense Refill  . amLODipine (NORVASC) 5 MG tablet TAKE 1 TABLET DAILY (Patient not taking: Reported on 11/24/2017) 90 tablet 0  . amLODipine (NORVASC) 5 MG tablet Take 1 tablet (5 mg total) by mouth daily.  90 tablet 0  . aspirin 81 MG tablet Take 1 tablet (81 mg total) by mouth daily. 30 tablet 0  . Cholecalciferol (VITAMIN D3) 2000 UNITS TABS Take 2,000 Units by mouth daily.     . hydrochlorothiazide (HYDRODIURIL) 25 MG tablet Take 1 tablet (25 mg total) by mouth daily. 90 tablet 3  . Multiple Vitamin (MULTIVITAMIN WITH MINERALS) TABS Take 1 tablet by  mouth daily.    . Omega-3 Fatty Acids (FISH OIL TRIPLE STRENGTH PO) Take 1 capsule by mouth daily.    . pravastatin (PRAVACHOL) 40 MG tablet Take 1 tablet (40 mg total) by mouth daily. 90 tablet 3  . QUEtiapine (SEROQUEL) 25 MG tablet Take 1 tablet (25 mg total) by mouth at bedtime. 90 tablet 3  . ramipril (ALTACE) 10 MG capsule Take 1 capsule (10 mg total) by mouth 2 (two) times daily. Patient should schedule an appointment for further refills. 180 capsule 0  . traMADol (ULTRAM) 50 MG tablet TAKE 2 TABLETS BY MOUTH EVERY 8 HOURS AS NEEDED 180 tablet 5   No current facility-administered medications on file prior to visit.     ALLERGIES: No Known Allergies  FAMILY HISTORY: Family History  Problem Relation Age of Onset  . Heart attack Mother   . Heart attack Father   . Other Son        MVA  . Diabetes Neg Hx   . Cancer Neg Hx     SOCIAL HISTORY: Social History   Socioeconomic History  . Marital status: Married    Spouse name: Wynetta Emery  . Number of children: 6  . Years of education: Not on file  . Highest education level: 12th grade  Occupational History  . Occupation: Retired  Scientific laboratory technician  . Financial resource strain: Not on file  . Food insecurity:    Worry: Not on file    Inability: Not on file  . Transportation needs:    Medical: Not on file    Non-medical: Not on file  Tobacco Use  . Smoking status: Never Smoker  . Smokeless tobacco: Never Used  Substance and Sexual Activity  . Alcohol use: No  . Drug use: No  . Sexual activity: Not on file  Lifestyle  . Physical activity:    Days per week: Not on file    Minutes per session: Not on file  . Stress: Not on file  Relationships  . Social connections:    Talks on phone: Not on file    Gets together: Not on file    Attends religious service: Not on file    Active member of club or organization: Not on file    Attends meetings of clubs or organizations: Not on file    Relationship status: Not on file  .  Intimate partner violence:    Fear of current or ex partner: Not on file    Emotionally abused: Not on file    Physically abused: Not on file    Forced sexual activity: Not on file  Other Topics Concern  . Not on file  Social History Narrative   64th anniversary      Patient is right-handed. She lives with her husband. She avoids caffeine. She does not exercise.    REVIEW OF SYSTEMS: Constitutional: No fevers, chills, or sweats, no generalized fatigue, change in appetite Eyes: No visual changes, double vision, eye pain Ear, nose and throat: No hearing loss, ear pain, nasal congestion, sore throat Cardiovascular: No chest pain, palpitations Respiratory:  No shortness of breath at rest or with exertion, wheezes GastrointestinaI: No nausea, vomiting, diarrhea, abdominal pain, fecal incontinence Genitourinary:  No dysuria, urinary retention or frequency Musculoskeletal:  No neck pain, back pain Integumentary: No rash, pruritus, skin lesions Neurological: as above Psychiatric: No depression, insomnia, anxiety Endocrine: No palpitations, fatigue, diaphoresis, mood swings, change in appetite, change in weight, increased thirst Hematologic/Lymphatic:  No purpura, petechiae. Allergic/Immunologic: no itchy/runny eyes, nasal congestion, recent allergic reactions, rashes  PHYSICAL EXAM: Vitals:   11/29/17 1451  BP: 108/66  Pulse: 88  SpO2: 96%   General: No acute distress.  Patient appears well-groomed.  Head:  Normocephalic/atraumatic Eyes:  fundi examined but not visualized Neck: supple, no paraspinal tenderness, full range of motion Back: No paraspinal tenderness Heart: regular rate and rhythm Lungs: Clear to auscultation bilaterally. Vascular: No carotid bruits. Neurological Exam: Mental status: alert and oriented to person, place, and time, recent and remote memory intact, fund of knowledge intact, attention and concentration intact, speech fluent and not dysarthric, language  intact. Cranial nerves: CN I: not tested CN II: pupils equal, round and reactive to light, visual fields intact CN III, IV, VI:  full range of motion, no nystagmus, no ptosis CN V: facial sensation intact CN VII: upper and lower face symmetric CN VIII: hearing intact CN IX, X: gag intact, uvula midline CN XI: sternocleidomastoid and trapezius muscles intact CN XII: tongue midline Bulk & Tone: normal, no fasciculations. Motor:  5/5 throughout  Sensation:  Decreased pinprick sensation in left lower extremity below the knee (secondary to nerve damage from knee surgery); vibration sensation intact. Deep Tendon Reflexes:  1+ upper extremities, absent lower extremities, toes downgoing.  Finger to nose testing:  Without dysmetria.  Heel to shin:  Without dysmetria.  Gait:  Normal station and stride.  Romberg negative.  IMPRESSION: 1.  Thunderclap headache.  Imaging negative for aneurysm/subarachnoid hemorrhage.  Possible migraine. 2.  Visual disturbance.  Possible migraine aura, however unusual for it to occur daily throughout the day for the past week.  Unfortunately, she is unable to have an MRI of the brain due to pacemaker in order to evaluate for possible stroke. 3.  Medication-overuse headache  PLAN: 1.  We will treat for persistent migraine aura and start topiramate (Topamax) 25mg  at bedtime.  Possible side effects include: impaired thinking, sedation, paresthesias (numbness and tingling) and weight loss.  It may cause dehydration and there is a small risk for kidney stones, so make sure to stay hydrated with water during the day.  There is also a very small risk for glaucoma, so if you notice any change in your vision while taking this medication, see an ophthalmologist.   2.  Limit use of pain relievers (Excedrin, tramadol) to no more than 2 days out of the week to prevent rebound headache 3.  Keep headache diary 4.  Take aspirin 81mg  daily 5. We will check a BMP (elevated BUN/Cr).   Advised to increase water intake. 6.  Follow up in 4 months.  Thank you for allowing me to take part in the care of this patient.  Metta Clines, DO  CC:  [name], MD

## 2017-11-30 ENCOUNTER — Other Ambulatory Visit (INDEPENDENT_AMBULATORY_CARE_PROVIDER_SITE_OTHER): Payer: Medicare Other

## 2017-11-30 ENCOUNTER — Ambulatory Visit: Payer: Medicare Other | Admitting: Family Medicine

## 2017-11-30 ENCOUNTER — Encounter: Payer: Self-pay | Admitting: Family Medicine

## 2017-11-30 DIAGNOSIS — Z79899 Other long term (current) drug therapy: Secondary | ICD-10-CM | POA: Diagnosis not present

## 2017-11-30 DIAGNOSIS — I6509 Occlusion and stenosis of unspecified vertebral artery: Secondary | ICD-10-CM | POA: Insufficient documentation

## 2017-11-30 LAB — BASIC METABOLIC PANEL
BUN: 31 mg/dL — AB (ref 6–23)
CALCIUM: 10.4 mg/dL (ref 8.4–10.5)
CHLORIDE: 99 meq/L (ref 96–112)
CO2: 32 mEq/L (ref 19–32)
CREATININE: 1.17 mg/dL (ref 0.40–1.20)
GFR: 46.73 mL/min — AB (ref >60.00)
Glucose, Bld: 81 mg/dL (ref 70–99)
Potassium: 4.2 mEq/L (ref 3.5–5.1)
Sodium: 138 mEq/L (ref 135–145)

## 2017-12-09 ENCOUNTER — Other Ambulatory Visit: Payer: Self-pay | Admitting: *Deleted

## 2017-12-09 MED ORDER — RAMIPRIL 10 MG PO CAPS: 10.0000 mg | ORAL_CAPSULE | Freq: Two times a day (BID) | ORAL | 0 refills | Status: DC

## 2017-12-28 ENCOUNTER — Other Ambulatory Visit: Payer: Self-pay | Admitting: *Deleted

## 2017-12-28 ENCOUNTER — Other Ambulatory Visit: Payer: Self-pay | Admitting: Family Medicine

## 2017-12-28 MED ORDER — TRAMADOL HCL 50 MG PO TABS: ORAL_TABLET | ORAL | 0 refills | Status: DC

## 2018-01-13 ENCOUNTER — Other Ambulatory Visit: Payer: Self-pay | Admitting: Family Medicine

## 2018-01-13 DIAGNOSIS — H539 Unspecified visual disturbance: Secondary | ICD-10-CM

## 2018-01-13 NOTE — Assessment & Plan Note (Signed)
Started with "spots" in her visual field. They thought she was having stroke. Stroke w/u negative or acute event. "spots getting worse" ---now cannot drive. She see optomotrist (Dr Clydene Laming) I am sending to opthalmologist

## 2018-01-22 ENCOUNTER — Other Ambulatory Visit: Payer: Self-pay | Admitting: Internal Medicine

## 2018-01-23 ENCOUNTER — Ambulatory Visit: Payer: Medicare Other | Admitting: Neurology

## 2018-01-23 ENCOUNTER — Encounter

## 2018-01-24 ENCOUNTER — Other Ambulatory Visit: Payer: Self-pay | Admitting: *Deleted

## 2018-01-24 DIAGNOSIS — H02834 Dermatochalasis of left upper eyelid: Secondary | ICD-10-CM | POA: Diagnosis not present

## 2018-01-24 DIAGNOSIS — H26493 Other secondary cataract, bilateral: Secondary | ICD-10-CM | POA: Diagnosis not present

## 2018-01-24 DIAGNOSIS — H35372 Puckering of macula, left eye: Secondary | ICD-10-CM | POA: Diagnosis not present

## 2018-01-24 DIAGNOSIS — H5315 Visual distortions of shape and size: Secondary | ICD-10-CM | POA: Diagnosis not present

## 2018-01-24 DIAGNOSIS — H02831 Dermatochalasis of right upper eyelid: Secondary | ICD-10-CM | POA: Diagnosis not present

## 2018-01-24 DIAGNOSIS — Z961 Presence of intraocular lens: Secondary | ICD-10-CM | POA: Diagnosis not present

## 2018-01-24 DIAGNOSIS — H353132 Nonexudative age-related macular degeneration, bilateral, intermediate dry stage: Secondary | ICD-10-CM | POA: Diagnosis not present

## 2018-01-24 MED ORDER — TRAMADOL HCL 50 MG PO TABS: ORAL_TABLET | ORAL | 5 refills | Status: DC

## 2018-01-26 DIAGNOSIS — H26491 Other secondary cataract, right eye: Secondary | ICD-10-CM | POA: Diagnosis not present

## 2018-01-26 DIAGNOSIS — Z23 Encounter for immunization: Secondary | ICD-10-CM | POA: Diagnosis not present

## 2018-01-26 DIAGNOSIS — Z961 Presence of intraocular lens: Secondary | ICD-10-CM | POA: Diagnosis not present

## 2018-02-06 DIAGNOSIS — H35372 Puckering of macula, left eye: Secondary | ICD-10-CM | POA: Diagnosis not present

## 2018-02-06 DIAGNOSIS — H353132 Nonexudative age-related macular degeneration, bilateral, intermediate dry stage: Secondary | ICD-10-CM | POA: Diagnosis not present

## 2018-02-06 DIAGNOSIS — Z961 Presence of intraocular lens: Secondary | ICD-10-CM | POA: Diagnosis not present

## 2018-02-06 DIAGNOSIS — H35361 Drusen (degenerative) of macula, right eye: Secondary | ICD-10-CM | POA: Diagnosis not present

## 2018-02-07 ENCOUNTER — Ambulatory Visit (INDEPENDENT_AMBULATORY_CARE_PROVIDER_SITE_OTHER): Payer: Medicare Other | Admitting: *Deleted

## 2018-02-07 DIAGNOSIS — I442 Atrioventricular block, complete: Secondary | ICD-10-CM

## 2018-02-08 NOTE — Progress Notes (Signed)
Remote pacemaker transmission.   

## 2018-02-09 ENCOUNTER — Ambulatory Visit (INDEPENDENT_AMBULATORY_CARE_PROVIDER_SITE_OTHER): Payer: Medicare Other | Admitting: Internal Medicine

## 2018-02-09 ENCOUNTER — Encounter: Payer: Self-pay | Admitting: Internal Medicine

## 2018-02-09 VITALS — BP 130/68 | HR 72 | Ht 65.0 in | Wt 160.2 lb

## 2018-02-09 DIAGNOSIS — I472 Ventricular tachycardia: Secondary | ICD-10-CM

## 2018-02-09 DIAGNOSIS — I10 Essential (primary) hypertension: Secondary | ICD-10-CM | POA: Diagnosis not present

## 2018-02-09 DIAGNOSIS — I442 Atrioventricular block, complete: Secondary | ICD-10-CM | POA: Diagnosis not present

## 2018-02-09 DIAGNOSIS — I4729 Other ventricular tachycardia: Secondary | ICD-10-CM

## 2018-02-09 LAB — CUP PACEART INCLINIC DEVICE CHECK
Battery Remaining Longevity: 90 mo
Battery Voltage: 2.78 V
Brady Statistic AS VP Percent: 74 %
Implantable Lead Implant Date: 20140331
Implantable Lead Location: 753859
Implantable Lead Location: 753860
Implantable Lead Model: 5076
Implantable Lead Model: 5076
Implantable Pulse Generator Implant Date: 20140331
Lead Channel Impedance Value: 528 Ohm
Lead Channel Pacing Threshold Amplitude: 0.5 V
Lead Channel Pacing Threshold Amplitude: 1 V
Lead Channel Pacing Threshold Pulse Width: 0.4 ms
Lead Channel Pacing Threshold Pulse Width: 0.4 ms
Lead Channel Sensing Intrinsic Amplitude: 4 mV
Lead Channel Setting Pacing Amplitude: 2 V
Lead Channel Setting Pacing Amplitude: 2.5 V
Lead Channel Setting Pacing Pulse Width: 0.4 ms
Lead Channel Setting Sensing Sensitivity: 2 mV
MDC IDC LEAD IMPLANT DT: 20140331
MDC IDC MSMT BATTERY IMPEDANCE: 424 Ohm
MDC IDC MSMT LEADCHNL RA PACING THRESHOLD AMPLITUDE: 0.375 V
MDC IDC MSMT LEADCHNL RA PACING THRESHOLD PULSEWIDTH: 0.4 ms
MDC IDC MSMT LEADCHNL RV IMPEDANCE VALUE: 571 Ohm
MDC IDC MSMT LEADCHNL RV PACING THRESHOLD AMPLITUDE: 1 V
MDC IDC MSMT LEADCHNL RV PACING THRESHOLD PULSEWIDTH: 0.4 ms
MDC IDC SESS DTM: 20191010173014
MDC IDC STAT BRADY AP VP PERCENT: 26 %
MDC IDC STAT BRADY AP VS PERCENT: 0 %
MDC IDC STAT BRADY AS VS PERCENT: 0 %

## 2018-02-09 NOTE — Progress Notes (Signed)
Patient Care Team: Dickie La, MD as PCP - General   HPI  Shelley Black is a 84 y.o. female Seen following pacemaker implantation 3/14 for complete heart block that was complicated by pause dependent polymorphic ventricular tachycardia  The patient denies chest pain, shortness of breath, nocturnal dyspnea, orthopnea    There have been no palpitations, lightheadedness or syncope. Chronic edema    Past Medical History:  Diagnosis Date  . Arthritis   . Cardiac pacemaker Medtronic   . Complete heart block ([place])   . Heart murmur    not seen by cardiologist  . Hypertension     Past Surgical History:  Procedure Laterality Date  . APPENDECTOMY    . CATARACT EXTRACTION, BILATERAL    . LEFT HEART CATHETERIZATION WITH CORONARY ANGIOGRAM N/A 07/29/2012   Procedure: LEFT HEART CATHETERIZATION WITH CORONARY ANGIOGRAM;  Surgeon: Peter M Martinique, MD;  Location: [facility] CATH LAB;  Service: Cardiovascular;  Laterality: N/A;  . PERMANENT PACEMAKER INSERTION N/A 07/31/2012   Procedure: PERMANENT PACEMAKER INSERTION;  Surgeon: Deboraha Sprang, MD;  Location: [facility] CATH LAB;  Service: Cardiovascular;  Laterality: N/A;  . ROTATOR CUFF REPAIR Right   . TEMPORARY PACEMAKER INSERTION  07/29/2012   Procedure: TEMPORARY PACEMAKER INSERTION;  Surgeon: Peter M Martinique, MD;  Location: [facility] CATH LAB;  Service: Cardiovascular;;  . THYROID SURGERY    . TOTAL KNEE ARTHROPLASTY Left   . TOTAL KNEE ARTHROPLASTY  12/24/2011   Procedure: TOTAL KNEE ARTHROPLASTY;  Surgeon: Alta Corning, MD;  Location: [place];  Service: Orthopedics;  Laterality: Right;  general anesthesia with pre op femoral nerve block    Current Outpatient Medications  Medication Sig Dispense Refill  . amLODipine (NORVASC) 5 MG tablet Take 1 tablet (5 mg total) by mouth daily. Please keep upcoming appt in October with Dr. Caryl Comes for future refills. Thank you 90 tablet 0  . aspirin 81 MG tablet Take 1 tablet (81 mg total) by mouth daily. 30 tablet 0   . Cholecalciferol (VITAMIN D3) 2000 UNITS TABS Take 2,000 Units by mouth daily.     . hydrochlorothiazide (HYDRODIURIL) 25 MG tablet Take 1 tablet (25 mg total) by mouth daily. 90 tablet 3  . Multiple Vitamin (MULTIVITAMIN WITH MINERALS) TABS Take 1 tablet by mouth daily.    . Omega-3 Fatty Acids (FISH OIL TRIPLE STRENGTH PO) Take 1 capsule by mouth daily.    . pravastatin (PRAVACHOL) 40 MG tablet Take 1 tablet (40 mg total) by mouth daily. 90 tablet 3  . QUEtiapine (SEROQUEL) 25 MG tablet Take 1 tablet (25 mg total) by mouth at bedtime. 90 tablet 3  . ramipril (ALTACE) 10 MG capsule Take 1 capsule (10 mg total) by mouth 2 (two) times daily. Patient should schedule an appointment for further refills. 180 capsule 0  . topiramate (TOPAMAX) 25 MG tablet Take 1 tablet (25 mg total) by mouth at bedtime. 30 tablet 3  . traMADol (ULTRAM) 50 MG tablet TAKE 2 TABLETS BY MOUTH EVERY 8 HOURS AS NEEDED 180 tablet 5   No current facility-administered medications for this visit.     No Known Allergies  Review of Systems negative except from HPI and PMH  Physical Exam BP 130/68   Pulse 72   Ht 5\' 5"  (1.651 m)   Wt 160 lb 3.2 oz (72.7 kg)   SpO2 95%   BMI 26.66 kg/m  Well developed and nourished in no acute distress HENT normal Neck supple with JVP-flat  Clear Regular rate and rhythm, no murmurs or gallops Abd-soft with active BS No Clubbing cyanosis L>R edema Skin-warm and dry A & Oriented  Grossly normal sensory and motor function    ECG demonstrates P-synchronous/ AV  pacing   Assessment and  Plan  Complete heart block -intermittent  Polymorphic ventricular tachycardia-secondary  No intercurrent Ventricular tachycardia  Pacemaker Medtronic The patient's device was interrogated.  The information was reviewed. No changes were made in the programming.    Unilateral lower extremity edema  Hypertension      BP well controlled  CHronic LE asymmetric edema

## 2018-02-09 NOTE — Patient Instructions (Signed)
Medication Instructions:  Your physician recommends that you continue on your current medications as directed. Please refer to the Current Medication list given to you today. If you need a refill on your cardiac medications before your next appointment, please call your pharmacy.   Lab work: None ordered. If you have labs (blood work) drawn today and your tests are completely normal, you will receive your results only by: Marland Kitchen MyChart Message (if you have MyChart) OR . A paper copy in the mail If you have any lab test that is abnormal or we need to change your treatment, we will call you to review the results.  Testing/Procedures: None ordered.  Follow-Up: At [facility], you and your health needs are our priority.  As part of our continuing mission to provide you with exceptional heart care, we have created designated Provider Care Teams.  These Care Teams include your primary Cardiologist (physician) and Advanced Practice Providers (APPs -  Physician Assistants and Nurse Practitioners) who all work together to provide you with the care you need, when you need it. You will need a follow up appointment in 12 months.  Please call our office 2 months in advance to schedule this appointment.  You may see one of the following Advanced Practice Providers on your designated Care Team:   Chanetta Marshall, NP . Tommye Standard, PA-C  Any Other Special Instructions Will Be Listed Below (If Applicable).

## 2018-02-16 ENCOUNTER — Encounter: Payer: Self-pay | Admitting: Cardiology

## 2018-03-01 DIAGNOSIS — H35372 Puckering of macula, left eye: Secondary | ICD-10-CM | POA: Diagnosis not present

## 2018-03-02 LAB — CUP PACEART REMOTE DEVICE CHECK
Battery Voltage: 2.78 V
Brady Statistic AP VP Percent: 26 %
Brady Statistic AP VS Percent: 0 %
Brady Statistic AS VP Percent: 74 %
Date Time Interrogation Session: 20191008144722
Implantable Lead Location: 753860
Implantable Lead Model: 5076
Implantable Lead Model: 5076
Lead Channel Impedance Value: 529 Ohm
Lead Channel Pacing Threshold Amplitude: 0.375 V
Lead Channel Pacing Threshold Amplitude: 1.125 V
Lead Channel Pacing Threshold Pulse Width: 0.4 ms
Lead Channel Pacing Threshold Pulse Width: 0.4 ms
MDC IDC LEAD IMPLANT DT: 20140331
MDC IDC LEAD IMPLANT DT: 20140331
MDC IDC LEAD LOCATION: 753859
MDC IDC MSMT BATTERY IMPEDANCE: 399 Ohm
MDC IDC MSMT BATTERY REMAINING LONGEVITY: 92 mo
MDC IDC MSMT LEADCHNL RV IMPEDANCE VALUE: 560 Ohm
MDC IDC PG IMPLANT DT: 20140331
MDC IDC SET LEADCHNL RA PACING AMPLITUDE: 2 V
MDC IDC SET LEADCHNL RV PACING AMPLITUDE: 2.5 V
MDC IDC SET LEADCHNL RV PACING PULSEWIDTH: 0.4 ms
MDC IDC SET LEADCHNL RV SENSING SENSITIVITY: 2 mV
MDC IDC STAT BRADY AS VS PERCENT: 0 %

## 2018-03-06 ENCOUNTER — Other Ambulatory Visit: Payer: Self-pay | Admitting: Family Medicine

## 2018-03-09 DIAGNOSIS — Z09 Encounter for follow-up examination after completed treatment for conditions other than malignant neoplasm: Secondary | ICD-10-CM | POA: Diagnosis not present

## 2018-03-09 DIAGNOSIS — H353132 Nonexudative age-related macular degeneration, bilateral, intermediate dry stage: Secondary | ICD-10-CM | POA: Diagnosis not present

## 2018-03-09 DIAGNOSIS — H35372 Puckering of macula, left eye: Secondary | ICD-10-CM | POA: Diagnosis not present

## 2018-05-02 ENCOUNTER — Ambulatory Visit: Payer: Medicare Other | Admitting: Neurology

## 2018-05-04 ENCOUNTER — Other Ambulatory Visit: Payer: Self-pay | Admitting: Internal Medicine

## 2018-05-09 ENCOUNTER — Ambulatory Visit (INDEPENDENT_AMBULATORY_CARE_PROVIDER_SITE_OTHER): Payer: Medicare Other

## 2018-05-09 DIAGNOSIS — I442 Atrioventricular block, complete: Secondary | ICD-10-CM | POA: Diagnosis not present

## 2018-05-10 NOTE — Progress Notes (Signed)
Remote pacemaker transmission.   

## 2018-05-11 LAB — CUP PACEART REMOTE DEVICE CHECK
Battery Impedance: 473 Ohm
Brady Statistic AP VP Percent: 25 %
Brady Statistic AP VS Percent: 0 %
Brady Statistic AS VP Percent: 74 %
Brady Statistic AS VS Percent: 0 %
Date Time Interrogation Session: 20200107180221
Implantable Lead Implant Date: 20140331
Implantable Lead Location: 753859
Implantable Lead Model: 5076
Implantable Lead Model: 5076
Lead Channel Impedance Value: 520 Ohm
Lead Channel Impedance Value: 559 Ohm
Lead Channel Pacing Threshold Amplitude: 0.375 V
Lead Channel Pacing Threshold Pulse Width: 0.4 ms
Lead Channel Setting Pacing Amplitude: 2.5 V
MDC IDC LEAD IMPLANT DT: 20140331
MDC IDC LEAD LOCATION: 753860
MDC IDC MSMT BATTERY REMAINING LONGEVITY: 86 mo
MDC IDC MSMT BATTERY VOLTAGE: 2.78 V
MDC IDC MSMT LEADCHNL RV PACING THRESHOLD AMPLITUDE: 0.875 V
MDC IDC MSMT LEADCHNL RV PACING THRESHOLD PULSEWIDTH: 0.4 ms
MDC IDC PG IMPLANT DT: 20140331
MDC IDC SET LEADCHNL RA PACING AMPLITUDE: 2 V
MDC IDC SET LEADCHNL RV PACING PULSEWIDTH: 0.4 ms
MDC IDC SET LEADCHNL RV SENSING SENSITIVITY: 2 mV

## 2018-05-18 DIAGNOSIS — H353122 Nonexudative age-related macular degeneration, left eye, intermediate dry stage: Secondary | ICD-10-CM | POA: Diagnosis not present

## 2018-05-18 DIAGNOSIS — H35372 Puckering of macula, left eye: Secondary | ICD-10-CM | POA: Diagnosis not present

## 2018-05-18 DIAGNOSIS — Z961 Presence of intraocular lens: Secondary | ICD-10-CM | POA: Diagnosis not present

## 2018-05-18 DIAGNOSIS — H353132 Nonexudative age-related macular degeneration, bilateral, intermediate dry stage: Secondary | ICD-10-CM | POA: Diagnosis not present

## 2018-05-18 DIAGNOSIS — H353111 Nonexudative age-related macular degeneration, right eye, early dry stage: Secondary | ICD-10-CM | POA: Diagnosis not present

## 2018-06-08 DIAGNOSIS — H35362 Drusen (degenerative) of macula, left eye: Secondary | ICD-10-CM | POA: Diagnosis not present

## 2018-06-08 DIAGNOSIS — H35372 Puckering of macula, left eye: Secondary | ICD-10-CM | POA: Diagnosis not present

## 2018-06-08 DIAGNOSIS — H35361 Drusen (degenerative) of macula, right eye: Secondary | ICD-10-CM | POA: Diagnosis not present

## 2018-06-08 DIAGNOSIS — H353132 Nonexudative age-related macular degeneration, bilateral, intermediate dry stage: Secondary | ICD-10-CM | POA: Diagnosis not present

## 2018-06-18 ENCOUNTER — Other Ambulatory Visit: Payer: Self-pay | Admitting: Family Medicine

## 2018-06-21 ENCOUNTER — Other Ambulatory Visit: Payer: Self-pay

## 2018-06-21 MED ORDER — RAMIPRIL 10 MG PO CAPS: 10.0000 mg | ORAL_CAPSULE | Freq: Two times a day (BID) | ORAL | 3 refills | Status: DC

## 2018-06-21 MED ORDER — QUETIAPINE FUMARATE 25 MG PO TABS: 25.0000 mg | ORAL_TABLET | Freq: Every day | ORAL | 3 refills | Status: DC

## 2018-07-11 ENCOUNTER — Other Ambulatory Visit: Payer: Self-pay | Admitting: Family Medicine

## 2018-07-31 ENCOUNTER — Telehealth: Payer: Self-pay

## 2018-08-01 NOTE — Telephone Encounter (Signed)
Please let him know I cannot set up refill time That is up to his insurance and his pharmacy I will refill tho THANKS! [name]

## 2018-08-01 NOTE — Telephone Encounter (Signed)
Melanie sent me the message but it was Wynetta Emery who called on behalf of his wife Matison. She is in need of a refill but I think he was hoping you could set a specific date so they could pick it up at the same time? I'll let him know you'll refill her medication but it's up to them to determine pickup times.  Thanks!

## 2018-08-03 ENCOUNTER — Other Ambulatory Visit: Payer: Self-pay | Admitting: Family Medicine

## 2018-08-03 MED ORDER — TRAMADOL HCL 50 MG PO TABS: ORAL_TABLET | ORAL | 5 refills | Status: DC

## 2018-08-08 ENCOUNTER — Ambulatory Visit (INDEPENDENT_AMBULATORY_CARE_PROVIDER_SITE_OTHER): Payer: Medicare Other | Admitting: *Deleted

## 2018-08-08 ENCOUNTER — Other Ambulatory Visit: Payer: Self-pay

## 2018-08-08 DIAGNOSIS — I442 Atrioventricular block, complete: Secondary | ICD-10-CM | POA: Diagnosis not present

## 2018-08-08 DIAGNOSIS — I471 Supraventricular tachycardia: Secondary | ICD-10-CM

## 2018-08-09 ENCOUNTER — Telehealth: Payer: Self-pay

## 2018-08-09 LAB — CUP PACEART REMOTE DEVICE CHECK
Battery Impedance: 547 Ohm
Battery Remaining Longevity: 79 mo
Battery Voltage: 2.78 V
Brady Statistic AP VP Percent: 27 %
Brady Statistic AP VS Percent: 0 %
Brady Statistic AS VP Percent: 73 %
Brady Statistic AS VS Percent: 0 %
Date Time Interrogation Session: 20200408114647
Implantable Lead Implant Date: 20140331
Implantable Lead Implant Date: 20140331
Implantable Lead Location: 753859
Implantable Lead Location: 753860
Implantable Lead Model: 5076
Implantable Lead Model: 5076
Implantable Pulse Generator Implant Date: 20140331
Lead Channel Impedance Value: 512 Ohm
Lead Channel Impedance Value: 528 Ohm
Lead Channel Pacing Threshold Amplitude: 0.375 V
Lead Channel Pacing Threshold Amplitude: 1.125 V
Lead Channel Pacing Threshold Pulse Width: 0.4 ms
Lead Channel Pacing Threshold Pulse Width: 0.4 ms
Lead Channel Setting Pacing Amplitude: 2 V
Lead Channel Setting Pacing Amplitude: 2.5 V
Lead Channel Setting Pacing Pulse Width: 0.4 ms
Lead Channel Setting Sensing Sensitivity: 2 mV

## 2018-08-09 NOTE — Telephone Encounter (Signed)
Spoke with patient to remind of missed remote transmission 

## 2018-08-16 NOTE — Progress Notes (Signed)
Remote pacemaker transmission.   

## 2018-09-06 ENCOUNTER — Other Ambulatory Visit: Payer: Self-pay

## 2018-09-06 ENCOUNTER — Telehealth (INDEPENDENT_AMBULATORY_CARE_PROVIDER_SITE_OTHER): Payer: Medicare Other | Admitting: Family Medicine

## 2018-09-06 DIAGNOSIS — N390 Urinary tract infection, site not specified: Secondary | ICD-10-CM | POA: Insufficient documentation

## 2018-09-06 DIAGNOSIS — N3000 Acute cystitis without hematuria: Secondary | ICD-10-CM | POA: Diagnosis not present

## 2018-09-06 MED ORDER — CEPHALEXIN 250 MG PO CAPS: 250.0000 mg | ORAL_CAPSULE | Freq: Four times a day (QID) | ORAL | 0 refills | Status: DC

## 2018-09-06 NOTE — Progress Notes (Signed)
Agrees to video visit Worried about Uti. Has urgency, frequency, hesitancy, and dysuria. Denies fever, CVA pain, nausea, vomiting and constitutional sx.  Feels well, just going to the bathroom "all the time." Amazingly, no hx of prior UTI. No bleeding (urinary, bowel or vaginal).  No bowel changes. Duration of visit was 15 minutes.

## 2018-09-07 NOTE — Assessment & Plan Note (Addendum)
Fits with uncomplicated cystitis.  Red flags given (pyelo questions asked in progress note)  FU prn this problem

## 2018-11-07 ENCOUNTER — Ambulatory Visit (INDEPENDENT_AMBULATORY_CARE_PROVIDER_SITE_OTHER): Payer: Medicare Other | Admitting: *Deleted

## 2018-11-07 DIAGNOSIS — I442 Atrioventricular block, complete: Secondary | ICD-10-CM | POA: Diagnosis not present

## 2018-11-07 LAB — CUP PACEART REMOTE DEVICE CHECK
Battery Impedance: 597 Ohm
Battery Remaining Longevity: 77 mo
Battery Voltage: 2.78 V
Brady Statistic AP VP Percent: 26 %
Brady Statistic AP VS Percent: 0 %
Brady Statistic AS VP Percent: 74 %
Brady Statistic AS VS Percent: 0 %
Date Time Interrogation Session: 20200707154415
Implantable Lead Implant Date: 20140331
Implantable Lead Implant Date: 20140331
Implantable Lead Location: 753859
Implantable Lead Location: 753860
Implantable Lead Model: 5076
Implantable Lead Model: 5076
Implantable Pulse Generator Implant Date: 20140331
Lead Channel Impedance Value: 536 Ohm
Lead Channel Impedance Value: 548 Ohm
Lead Channel Pacing Threshold Amplitude: 0.375 V
Lead Channel Pacing Threshold Amplitude: 1 V
Lead Channel Pacing Threshold Pulse Width: 0.4 ms
Lead Channel Pacing Threshold Pulse Width: 0.4 ms
Lead Channel Setting Pacing Amplitude: 2 V
Lead Channel Setting Pacing Amplitude: 2.5 V
Lead Channel Setting Pacing Pulse Width: 0.4 ms
Lead Channel Setting Sensing Sensitivity: 2 mV

## 2018-11-19 ENCOUNTER — Encounter: Payer: Self-pay | Admitting: Cardiology

## 2018-11-19 NOTE — Progress Notes (Signed)
Remote pacemaker transmission.   

## 2018-12-15 ENCOUNTER — Other Ambulatory Visit: Payer: Self-pay | Admitting: Family Medicine

## 2019-01-22 ENCOUNTER — Other Ambulatory Visit: Payer: Self-pay | Admitting: Family Medicine

## 2019-02-05 DIAGNOSIS — Z23 Encounter for immunization: Secondary | ICD-10-CM | POA: Diagnosis not present

## 2019-02-06 ENCOUNTER — Ambulatory Visit (INDEPENDENT_AMBULATORY_CARE_PROVIDER_SITE_OTHER): Payer: Medicare Other | Admitting: *Deleted

## 2019-02-06 DIAGNOSIS — I442 Atrioventricular block, complete: Secondary | ICD-10-CM | POA: Diagnosis not present

## 2019-02-06 DIAGNOSIS — I469 Cardiac arrest, cause unspecified: Secondary | ICD-10-CM

## 2019-02-07 LAB — CUP PACEART REMOTE DEVICE CHECK
Battery Impedance: 672 Ohm
Battery Remaining Longevity: 70 mo
Battery Voltage: 2.78 V
Brady Statistic AP VP Percent: 26 %
Brady Statistic AP VS Percent: 0 %
Brady Statistic AS VP Percent: 74 %
Brady Statistic AS VS Percent: 0 %
Date Time Interrogation Session: 20201007145801
Implantable Lead Implant Date: 20140331
Implantable Lead Implant Date: 20140331
Implantable Lead Location: 753859
Implantable Lead Location: 753860
Implantable Lead Model: 5076
Implantable Lead Model: 5076
Implantable Pulse Generator Implant Date: 20140331
Lead Channel Impedance Value: 476 Ohm
Lead Channel Impedance Value: 480 Ohm
Lead Channel Pacing Threshold Amplitude: 0.375 V
Lead Channel Pacing Threshold Amplitude: 1.125 V
Lead Channel Pacing Threshold Pulse Width: 0.4 ms
Lead Channel Pacing Threshold Pulse Width: 0.4 ms
Lead Channel Setting Pacing Amplitude: 2 V
Lead Channel Setting Pacing Amplitude: 2.5 V
Lead Channel Setting Pacing Pulse Width: 0.4 ms
Lead Channel Setting Sensing Sensitivity: 2 mV

## 2019-02-16 NOTE — Progress Notes (Signed)
Remote pacemaker transmission.   

## 2019-02-19 ENCOUNTER — Other Ambulatory Visit: Payer: Self-pay | Admitting: Family Medicine

## 2019-02-20 ENCOUNTER — Telehealth: Payer: Self-pay | Admitting: *Deleted

## 2019-02-20 NOTE — Telephone Encounter (Signed)
Received fax from pharmacy, PA needed on Tramadol.  Clinical questions submitted via Cover My Meds.  Waiting on response, could take up to 72 hours.  Cover My Meds info: Key: [id number]  Christen Bame, CMA.

## 2019-02-20 NOTE — Telephone Encounter (Signed)
Med approved until 02/2020.  Pharmacy and pt informed. Christen Bame, CMA

## 2019-02-23 ENCOUNTER — Other Ambulatory Visit: Payer: Self-pay

## 2019-02-23 ENCOUNTER — Ambulatory Visit (INDEPENDENT_AMBULATORY_CARE_PROVIDER_SITE_OTHER): Payer: Medicare Other | Admitting: Family Medicine

## 2019-02-23 VITALS — BP 120/60 | Ht 65.0 in | Wt 145.0 lb

## 2019-02-23 DIAGNOSIS — H532 Diplopia: Secondary | ICD-10-CM

## 2019-02-23 DIAGNOSIS — R42 Dizziness and giddiness: Secondary | ICD-10-CM | POA: Diagnosis not present

## 2019-02-23 DIAGNOSIS — F4321 Adjustment disorder with depressed mood: Secondary | ICD-10-CM | POA: Diagnosis not present

## 2019-02-23 MED ORDER — RAMELTEON 8 MG PO TABS: 8.0000 mg | ORAL_TABLET | Freq: Every day | ORAL | 1 refills | Status: DC

## 2019-02-23 NOTE — Patient Instructions (Signed)
STOP Quetapine Start Ramelteon  Decrease the ramipril from 2 tabs a day to one tab a day

## 2019-02-24 DIAGNOSIS — H532 Diplopia: Secondary | ICD-10-CM | POA: Insufficient documentation

## 2019-02-24 DIAGNOSIS — F4321 Adjustment disorder with depressed mood: Secondary | ICD-10-CM | POA: Insufficient documentation

## 2019-02-24 DIAGNOSIS — R42 Dizziness and giddiness: Secondary | ICD-10-CM | POA: Insufficient documentation

## 2019-02-24 NOTE — Assessment & Plan Note (Addendum)
History of complete heart block with Medtronic pacer.  She also has history of left vertebral artery stenosis not recently evaluated.  Many potential diagnoses are in the differential.  I will start with decreasing her ramipril to once a day.  Her blood pressure is extremely well controlled today.  Perhaps we are over controlling her.  We will also discontinue ketamine.  She has been on this for many years.  Would like to change it to ramelteon for her insomnia.  We discussed red flags and she will call me if she has new or worsening symptoms.  Follow-up in 2 weeks.  We may need to get her back into the cardiologist and may need to reevaluate her vertebral artery source of her dizziness.  Right now I think the most likely is that her antihypertensive regimen is too aggressive plus minus side effects from therapy.  I did not get labs today as at [facility] medicine clinic where we do not have that capacity.  I would like to get CBC CMP at next office visit.  If she calls with new or worsening symptoms in the interim, I would at least like to get CBC.  She does have history of colon resection but has never had GI bleed.

## 2019-02-24 NOTE — Assessment & Plan Note (Signed)
She reports having double vision only with both eyes open.  She closes 1 eye, it resolves.  I urged her to follow-up with her optometrist ophthalmologist in the next 1 week.  Call if new or worsening symptoms.

## 2019-02-24 NOTE — Assessment & Plan Note (Signed)
Given the length of their marriage, she is actually doing quite well.  I am glad to see that she is living with her daughter I think that is the best plan for her right now.

## 2019-02-24 NOTE — Progress Notes (Signed)
  Shelley Black - 85 y.o. female MRN [id number]  Date of birth: 1930-10-21    SUBJECTIVE:      Chief Complaint:/ HPI:    #1.  Double vision intermittently over the last 6 to 8 weeks.  She did have some eye surgery done several months ago and is not exactly sure where the double vision started but it got worse 2 weeks ago.  She was taking some vitamins for her eyes, PreserVision, and had run out of these.  When her daughter brought her some more the double vision seemed to improve.  Notably 1 month ago her husband of 65 years passed away except there is been a lot of stress.  She is currently living with her daughter. 2.  Dizziness.  Not necessarily associated with the double vision.  The dizziness seems to occur more when she is getting up from sleep or from standing however it does not go away after a minute or 2 which is atypical from her previous episodes of orthostasis.  No room spinning.  Lightheaded is the best way to describe it.  Can last for hours.  ROS:     Denies chest pain.  Has not noted any palpitations.  Has been taking her as needed.  No eye pain.  No lower extremity edema.  No change in exercise tolerance.  No orthopnea.  PERTINENT  PMH / PSH FH / / SH:  Past Medical, Surgical, Social, and Family History Reviewed & Updated in the EMR.  Pertinent findings include:  History of complete heart block with chronic pacer recently interrogated in October.  OBJECTIVE: BP 120/60   Ht 5\' 5"  (1.651 m)   Wt 145 lb (65.8 kg)   BMI 24.13 kg/m   Physical Exam:  Vital signs are reviewed. GENERAL: Well-developed elderly female no acute distress.  She can get on and off the exam table without any assistance. GAIT: Slightly wide-based.  She walks without assistance. EYES: Pupils equal round reactive to light.  Extraocular muscles are intact and gaze is symmetrical.  Conjunctive is without any injection NECK: No lymphadenopathy.  Full range of motion Cardiovascular: Regular rate and rhythm.  EXTREMITY: No edema.  ASSESSMENT & PLAN:  See problem based charting & AVS for pt instructions. No problem-specific Assessment & Plan notes found for this encounter.

## 2019-03-07 ENCOUNTER — Ambulatory Visit: Payer: Medicare Other | Admitting: Family Medicine

## 2019-03-19 ENCOUNTER — Other Ambulatory Visit: Payer: Self-pay

## 2019-03-19 MED ORDER — HYDROCHLOROTHIAZIDE 25 MG PO TABS: 25.0000 mg | ORAL_TABLET | Freq: Every day | ORAL | 3 refills | Status: DC

## 2019-03-28 ENCOUNTER — Other Ambulatory Visit: Payer: Self-pay

## 2019-04-17 ENCOUNTER — Other Ambulatory Visit: Payer: Self-pay

## 2019-04-17 MED ORDER — AMLODIPINE BESYLATE 5 MG PO TABS: 5.0000 mg | ORAL_TABLET | Freq: Every day | ORAL | 0 refills | Status: DC

## 2019-04-17 NOTE — Telephone Encounter (Signed)
Refill for Amlodipine sent to [facility].

## 2019-05-08 ENCOUNTER — Ambulatory Visit (INDEPENDENT_AMBULATORY_CARE_PROVIDER_SITE_OTHER): Payer: Medicare Other | Admitting: *Deleted

## 2019-05-08 DIAGNOSIS — I442 Atrioventricular block, complete: Secondary | ICD-10-CM

## 2019-05-09 ENCOUNTER — Telehealth: Payer: Self-pay

## 2019-05-09 NOTE — Telephone Encounter (Signed)
Left message for patient to remind of missed remote transmission.  

## 2019-05-10 LAB — CUP PACEART REMOTE DEVICE CHECK
Battery Impedance: 722 Ohm
Battery Remaining Longevity: 68 mo
Battery Voltage: 2.78 V
Brady Statistic AP VP Percent: 26 %
Brady Statistic AP VS Percent: 0 %
Brady Statistic AS VP Percent: 74 %
Brady Statistic AS VS Percent: 0 %
Date Time Interrogation Session: 20210107145959
Implantable Lead Implant Date: 20140331
Implantable Lead Implant Date: 20140331
Implantable Lead Location: 753859
Implantable Lead Location: 753860
Implantable Lead Model: 5076
Implantable Lead Model: 5076
Implantable Pulse Generator Implant Date: 20140331
Lead Channel Impedance Value: 502 Ohm
Lead Channel Impedance Value: 505 Ohm
Lead Channel Pacing Threshold Amplitude: 0.375 V
Lead Channel Pacing Threshold Amplitude: 1 V
Lead Channel Pacing Threshold Pulse Width: 0.4 ms
Lead Channel Pacing Threshold Pulse Width: 0.4 ms
Lead Channel Setting Pacing Amplitude: 2 V
Lead Channel Setting Pacing Amplitude: 2.5 V
Lead Channel Setting Pacing Pulse Width: 0.4 ms
Lead Channel Setting Sensing Sensitivity: 2 mV

## 2019-06-07 ENCOUNTER — Other Ambulatory Visit: Payer: Self-pay | Admitting: Family Medicine

## 2019-06-11 NOTE — Telephone Encounter (Signed)
Pt is going out of town tomorrow.  Calling to check the status. Christen Bame, CMA

## 2019-06-11 NOTE — Telephone Encounter (Signed)
Pt is going out of town tomorrow and would like to get tramadol refilled today if at all possible. Ottis Stain, CMA

## 2019-07-25 DIAGNOSIS — H353132 Nonexudative age-related macular degeneration, bilateral, intermediate dry stage: Secondary | ICD-10-CM | POA: Diagnosis not present

## 2019-07-25 DIAGNOSIS — Z961 Presence of intraocular lens: Secondary | ICD-10-CM | POA: Diagnosis not present

## 2019-07-25 DIAGNOSIS — H04123 Dry eye syndrome of bilateral lacrimal glands: Secondary | ICD-10-CM | POA: Diagnosis not present

## 2019-07-25 DIAGNOSIS — H35372 Puckering of macula, left eye: Secondary | ICD-10-CM | POA: Diagnosis not present

## 2019-08-06 ENCOUNTER — Telehealth: Payer: Self-pay | Admitting: Family Medicine

## 2019-08-06 DIAGNOSIS — L603 Nail dystrophy: Secondary | ICD-10-CM

## 2019-08-06 NOTE — Telephone Encounter (Signed)
Daughter is calling and would like a referral for her mother to see a podiatrist. Her mother needs her nails cut. jw

## 2019-08-06 NOTE — Telephone Encounter (Signed)
Dear [name] Team Please let her know I have sent in the referral [facility]! Shelley Black

## 2019-08-07 ENCOUNTER — Ambulatory Visit (INDEPENDENT_AMBULATORY_CARE_PROVIDER_SITE_OTHER): Payer: Medicare Other | Admitting: *Deleted

## 2019-08-07 DIAGNOSIS — I442 Atrioventricular block, complete: Secondary | ICD-10-CM

## 2019-08-07 NOTE — Telephone Encounter (Signed)
Pt informed. Khady Vandenberg T Jamil Castillo, CMA  

## 2019-08-08 LAB — CUP PACEART REMOTE DEVICE CHECK
Battery Impedance: 824 Ohm
Battery Remaining Longevity: 64 mo
Battery Voltage: 2.77 V
Brady Statistic AP VP Percent: 28 %
Brady Statistic AP VS Percent: 0 %
Brady Statistic AS VP Percent: 72 %
Brady Statistic AS VS Percent: 0 %
Date Time Interrogation Session: 20210407100424
Implantable Lead Implant Date: 20140331
Implantable Lead Implant Date: 20140331
Implantable Lead Location: 753859
Implantable Lead Location: 753860
Implantable Lead Model: 5076
Implantable Lead Model: 5076
Implantable Pulse Generator Implant Date: 20140331
Lead Channel Impedance Value: 505 Ohm
Lead Channel Impedance Value: 505 Ohm
Lead Channel Pacing Threshold Amplitude: 0.375 V
Lead Channel Pacing Threshold Amplitude: 1 V
Lead Channel Pacing Threshold Pulse Width: 0.4 ms
Lead Channel Pacing Threshold Pulse Width: 0.4 ms
Lead Channel Setting Pacing Amplitude: 2 V
Lead Channel Setting Pacing Amplitude: 2.5 V
Lead Channel Setting Pacing Pulse Width: 0.4 ms
Lead Channel Setting Sensing Sensitivity: 2 mV

## 2019-08-10 ENCOUNTER — Other Ambulatory Visit: Payer: Self-pay | Admitting: Family Medicine

## 2019-08-10 ENCOUNTER — Other Ambulatory Visit: Payer: Self-pay | Admitting: Internal Medicine

## 2019-08-23 ENCOUNTER — Ambulatory Visit (INDEPENDENT_AMBULATORY_CARE_PROVIDER_SITE_OTHER): Payer: Medicare Other | Admitting: Podiatry

## 2019-08-23 ENCOUNTER — Other Ambulatory Visit: Payer: Self-pay

## 2019-08-23 ENCOUNTER — Encounter: Payer: Self-pay | Admitting: Podiatry

## 2019-08-23 DIAGNOSIS — M79676 Pain in unspecified toe(s): Secondary | ICD-10-CM

## 2019-08-23 DIAGNOSIS — B351 Tinea unguium: Secondary | ICD-10-CM

## 2019-08-23 NOTE — Progress Notes (Signed)
Subjective:   Patient ID: Shelley Black, female   DOB: 86 y.o.   MRN: [id number]   HPI Patient states that her nails have thickened in both feet and make it hard to wear shoe gear and she cannot cut them and they get irritated in the corners with the length that she experiences.  Patient states is been ongoing and worse over the last year and does not smoke likes to be active   Review of Systems  All other systems reviewed and are negative.       Objective:  Physical Exam Vitals and nursing note reviewed.  Constitutional:      Appearance: She is well-developed.  Pulmonary:     Effort: Pulmonary effort is normal.  Musculoskeletal:        General: Normal range of motion.  Skin:    General: Skin is warm.  Neurological:     Mental Status: She is alert.     Neurovascular status found to be mildly diminished intact bilateral with range of motion adequate subtalar midtarsal joint.  Patient is noted to have thick yellow brittle nailbeds 1-5 both feet that are moderately painful when pressed and get irritated in the corners with thick yellow subungual debris underneath the nail surface     Assessment:  Mycotic nail infection 1-5 both feet that can be tender as they grow out     Plan:  H&P reviewed reviewed condition debridement nailbeds 1-5 both feet no iatrogenic bleeding reappoint for routine care

## 2019-10-29 ENCOUNTER — Other Ambulatory Visit: Payer: Self-pay | Admitting: *Deleted

## 2019-10-30 MED ORDER — TRAMADOL HCL 50 MG PO TABS: ORAL_TABLET | ORAL | 4 refills | Status: DC

## 2019-11-06 ENCOUNTER — Ambulatory Visit (INDEPENDENT_AMBULATORY_CARE_PROVIDER_SITE_OTHER): Payer: Medicare Other | Admitting: *Deleted

## 2019-11-06 DIAGNOSIS — I442 Atrioventricular block, complete: Secondary | ICD-10-CM | POA: Diagnosis not present

## 2019-11-08 LAB — CUP PACEART REMOTE DEVICE CHECK
Battery Impedance: 981 Ohm
Battery Remaining Longevity: 57 mo
Battery Voltage: 2.77 V
Brady Statistic AP VP Percent: 29 %
Brady Statistic AP VS Percent: 0 %
Brady Statistic AS VP Percent: 71 %
Brady Statistic AS VS Percent: 0 %
Date Time Interrogation Session: 20210707132102
Implantable Lead Implant Date: 20140331
Implantable Lead Implant Date: 20140331
Implantable Lead Location: 753859
Implantable Lead Location: 753860
Implantable Lead Model: 5076
Implantable Lead Model: 5076
Implantable Pulse Generator Implant Date: 20140331
Lead Channel Impedance Value: 476 Ohm
Lead Channel Impedance Value: 500 Ohm
Lead Channel Pacing Threshold Amplitude: 0.375 V
Lead Channel Pacing Threshold Amplitude: 1.125 V
Lead Channel Pacing Threshold Pulse Width: 0.4 ms
Lead Channel Pacing Threshold Pulse Width: 0.4 ms
Lead Channel Setting Pacing Amplitude: 2 V
Lead Channel Setting Pacing Amplitude: 2.5 V
Lead Channel Setting Pacing Pulse Width: 0.4 ms
Lead Channel Setting Sensing Sensitivity: 2 mV

## 2019-11-08 NOTE — Progress Notes (Signed)
Remote pacemaker transmission.   

## 2020-01-01 ENCOUNTER — Other Ambulatory Visit: Payer: Self-pay | Admitting: *Deleted

## 2020-01-02 MED ORDER — HYDROCHLOROTHIAZIDE 25 MG PO TABS: 25.0000 mg | ORAL_TABLET | Freq: Every day | ORAL | 3 refills | Status: DC

## 2020-01-23 ENCOUNTER — Ambulatory Visit (INDEPENDENT_AMBULATORY_CARE_PROVIDER_SITE_OTHER): Payer: Medicare Other | Admitting: Family Medicine

## 2020-01-23 ENCOUNTER — Other Ambulatory Visit: Payer: Self-pay

## 2020-01-23 ENCOUNTER — Encounter: Payer: Self-pay | Admitting: Family Medicine

## 2020-01-23 VITALS — BP 112/62 | HR 62 | Ht 65.0 in | Wt 138.8 lb

## 2020-01-23 DIAGNOSIS — Z23 Encounter for immunization: Secondary | ICD-10-CM

## 2020-01-23 DIAGNOSIS — E785 Hyperlipidemia, unspecified: Secondary | ICD-10-CM | POA: Diagnosis not present

## 2020-01-23 DIAGNOSIS — R42 Dizziness and giddiness: Secondary | ICD-10-CM

## 2020-01-23 DIAGNOSIS — I442 Atrioventricular block, complete: Secondary | ICD-10-CM | POA: Diagnosis not present

## 2020-01-23 DIAGNOSIS — I10 Essential (primary) hypertension: Secondary | ICD-10-CM

## 2020-01-23 MED ORDER — QUETIAPINE FUMARATE 25 MG PO TABS: ORAL_TABLET | ORAL | 3 refills | Status: DC

## 2020-01-23 NOTE — Assessment & Plan Note (Signed)
Has pacer Last interrogation was OK Doubt this is contributing to her lightheadedness as her symptoms occur at rest as well as with activity.

## 2020-01-23 NOTE — Assessment & Plan Note (Signed)
With known arterial disease. Will continue pravastatin

## 2020-01-23 NOTE — Progress Notes (Signed)
    CHIEF COMPLAINT / HPI:  light headedness occasionally throughout most days. Can occur while she is seated. No other associated symptoms. 2. Insomnia and racing thoughts have been well controlled on quetapine. She would be reluctant to change or stop it 3. Living with her daughter and that is going realtively well. 4. Continues to use tramadol for arthritis pain and it helps about 50%, 5. had COVID vaccines. Would be interested in booster. Wants flu shot today. 6. Says her memory is not as sharp as it was. Has not had difficulty with doing any activities or getting lost.   PERTINENT  PMH / PSH: I have reviewed the patient's medications, allergies, past medical and surgical history, smoking status and updated in the EMR as appropriate.   OBJECTIVE:  BP 112/62   Pulse 62   Ht 5\' 5"  (1.651 m)   Wt 138 lb 12.8 oz (63 kg)   SpO2 96%   BMI 23.10 kg/m  Vital signs reviewed. GENERAL: Well-developed, well-nourished, no acute distress. CARDIOVASCULAR: Regular rate and rhythm no murmur gallop or rub LUNGS: Clear to auscultation bilaterally, no rales or wheeze. No unusual WOB. ABDOMEN: Soft positive bowel sounds NEURO: No gross focal neurological deficits except hard of hearing MSK: Movement of extremity x 4. Normal gait. Gets on and off table by herself. PSYCH: AxOx4. Good eye contact.. No psychomotor retardation or agitation. Appropriate speech fluency and content. Asks and answers questions appropriately. Mood is congruent. Excellent sense of humor    ASSESSMENT / PLAN: Labs today and will determine f/u based on those as well as BP redings she will send me  Dizziness and giddiness I think BP is too low. Will discontinue ramipril. Have them get some BP readings over next 2 weeks or so and send them to me. Make sure we don't lose BP control. If this does not improve and her BOP stays low, would consider also d/c HCTZ. Conitnueing amlodipine for now.  ALSo will decrease quetapine to  half tab. She is very attached to this medication so doubt she will be happy with discontinuig. We atrted it years ago when she was having some stressors with racing thoughts, particularly at night and it has done well for her. Tried to switch her off and on to something else for sleep at last visit and that was unsuccessful. She has pacer.  Hyperlipidemia With known arterial disease. Will continue pravastatin  Complete heart block Has pacer Last interrogation was OK Doubt this is contributing to her lightheadedness as her symptoms occur at rest as well as with activity.   [name] MD

## 2020-01-23 NOTE — Patient Instructions (Signed)
DECREASe the quetapine (Seroquel) to ONE HALF tab at night. It will still help you sleep  STOP the ramipril.

## 2020-01-23 NOTE — Assessment & Plan Note (Signed)
I think BP is too low. Will discontinue ramipril. Have them get some BP readings over next 2 weeks or so and send them to me. Make sure we don't lose BP control. If this does not improve and her BOP stays low, would consider also d/c HCTZ. Conitnueing amlodipine for now.  ALSo will decrease quetapine to half tab. She is very attached to this medication so doubt she will be happy with discontinuig. We atrted it years ago when she was having some stressors with racing thoughts, particularly at night and it has done well for her. Tried to switch her off and on to something else for sleep at last visit and that was unsuccessful. She has pacer.

## 2020-01-24 LAB — COMPREHENSIVE METABOLIC PANEL
ALT: 10 IU/L (ref 0–32)
AST: 12 IU/L (ref 0–40)
Albumin/Globulin Ratio: 1.3 (ref 1.2–2.2)
Albumin: 3.9 g/dL (ref 3.6–4.6)
Alkaline Phosphatase: 67 IU/L (ref 44–121)
BUN/Creatinine Ratio: 34 — ABNORMAL HIGH (ref 12–28)
BUN: 33 mg/dL — ABNORMAL HIGH (ref 8–27)
Bilirubin Total: <0.2 mg/dL (ref 0.0–1.2)
CO2: 29 mmol/L (ref 20–29)
Calcium: 9.5 mg/dL (ref 8.7–10.3)
Chloride: 101 mmol/L (ref 96–106)
Creatinine, Ser: 0.98 mg/dL (ref 0.57–1.00)
GFR calc Af Amer: 60 mL/min/{1.73_m2} (ref >59)
GFR calc non Af Amer: 52 mL/min/{1.73_m2} — ABNORMAL LOW (ref >59)
Globulin, Total: 2.9 g/dL (ref 1.5–4.5)
Glucose: 95 mg/dL (ref 65–99)
Potassium: 4.3 mmol/L (ref 3.5–5.2)
Sodium: 142 mmol/L (ref 134–144)
Total Protein: 6.8 g/dL (ref 6.0–8.5)

## 2020-01-24 LAB — LDL CHOLESTEROL, DIRECT: LDL Direct: 110 mg/dL — ABNORMAL HIGH (ref 0–99)

## 2020-01-28 DIAGNOSIS — Z23 Encounter for immunization: Secondary | ICD-10-CM | POA: Diagnosis not present

## 2020-02-04 ENCOUNTER — Encounter: Payer: Self-pay | Admitting: Family Medicine

## 2020-02-04 NOTE — Progress Notes (Signed)
Normal. Letter sent.

## 2020-02-05 ENCOUNTER — Ambulatory Visit (INDEPENDENT_AMBULATORY_CARE_PROVIDER_SITE_OTHER): Payer: Medicare Other

## 2020-02-05 DIAGNOSIS — I442 Atrioventricular block, complete: Secondary | ICD-10-CM

## 2020-02-07 LAB — CUP PACEART REMOTE DEVICE CHECK
Battery Impedance: 1059 Ohm
Battery Remaining Longevity: 55 mo
Battery Voltage: 2.77 V
Brady Statistic AP VP Percent: 31 %
Brady Statistic AP VS Percent: 0 %
Brady Statistic AS VP Percent: 69 %
Brady Statistic AS VS Percent: 0 %
Date Time Interrogation Session: 20211006125932
Implantable Lead Implant Date: 20140331
Implantable Lead Implant Date: 20140331
Implantable Lead Location: 753859
Implantable Lead Location: 753860
Implantable Lead Model: 5076
Implantable Lead Model: 5076
Implantable Pulse Generator Implant Date: 20140331
Lead Channel Impedance Value: 520 Ohm
Lead Channel Impedance Value: 544 Ohm
Lead Channel Pacing Threshold Amplitude: 0.375 V
Lead Channel Pacing Threshold Amplitude: 1.125 V
Lead Channel Pacing Threshold Pulse Width: 0.4 ms
Lead Channel Pacing Threshold Pulse Width: 0.4 ms
Lead Channel Setting Pacing Amplitude: 2 V
Lead Channel Setting Pacing Amplitude: 2.5 V
Lead Channel Setting Pacing Pulse Width: 0.4 ms
Lead Channel Setting Sensing Sensitivity: 2 mV

## 2020-02-08 NOTE — Progress Notes (Signed)
Remote pacemaker transmission.   

## 2020-02-28 ENCOUNTER — Other Ambulatory Visit: Payer: Self-pay | Admitting: Family Medicine

## 2020-03-03 ENCOUNTER — Telehealth: Payer: Self-pay

## 2020-03-03 NOTE — Telephone Encounter (Signed)
Dear [name] Team Please confirm she has STOPPED the amlodipine and just kept the HCTZ for these pressures? If that is true, then I would keep the HCTZ on for right now but cut it in half. Send me some new BP readings on half dose.   Shelley Black

## 2020-03-03 NOTE — Telephone Encounter (Signed)
Daughter calls nurse line to report last weeks blood pressures.   123/69-Tuesday 124/77-Wednesday 115/76-Thursday 108/57-Friday   Daughter states she has been taking HCTZ only. Daughter would like to know if they should stop this all together. Please advise.

## 2020-03-04 NOTE — Telephone Encounter (Signed)
Spoke to pt. She has been taking amlodipine (taken this am).  Pt was informed to stop the amlodipine. Pt was asked to call us with her BP readings for tomorrow, Thurs and Friday. Please advise if pt needs further direction.  Shelley Black, CMA

## 2020-03-04 NOTE — Telephone Encounter (Signed)
Called and spoke w pt. She continues to have intermittent (sometimes severe) dizziness. On reflection, I decided to keep her on the 5 mg amlodipine and discontinue the hydrodiuril for now. Perhaps her dizziness is volume relted? She also mentions it might be her eyes as her vision is getting worse--has appt in 2 wweeks w eye doctor. She will take BP readings and call me w update Monday.   She also said she has gone back to whole tab of seroquel as she does not sleep without it. I am still concerned tghis may be contributing to daytime dizziness but she is adament it is necessary for QOL. We started it for anxiety and night time rumination. She seems to be doing remarkable well in light of the loss of her husband so I hesuitate to push this issue.

## 2020-03-19 DIAGNOSIS — H43813 Vitreous degeneration, bilateral: Secondary | ICD-10-CM | POA: Diagnosis not present

## 2020-03-19 DIAGNOSIS — H35372 Puckering of macula, left eye: Secondary | ICD-10-CM | POA: Diagnosis not present

## 2020-03-19 DIAGNOSIS — H524 Presbyopia: Secondary | ICD-10-CM | POA: Diagnosis not present

## 2020-03-19 DIAGNOSIS — H53022 Refractive amblyopia, left eye: Secondary | ICD-10-CM | POA: Diagnosis not present

## 2020-03-19 DIAGNOSIS — H353132 Nonexudative age-related macular degeneration, bilateral, intermediate dry stage: Secondary | ICD-10-CM | POA: Diagnosis not present

## 2020-03-19 DIAGNOSIS — Z961 Presence of intraocular lens: Secondary | ICD-10-CM | POA: Diagnosis not present

## 2020-03-20 ENCOUNTER — Other Ambulatory Visit: Payer: Self-pay | Admitting: Family Medicine

## 2020-04-19 ENCOUNTER — Other Ambulatory Visit: Payer: Self-pay | Admitting: Internal Medicine

## 2020-05-06 ENCOUNTER — Ambulatory Visit (INDEPENDENT_AMBULATORY_CARE_PROVIDER_SITE_OTHER): Payer: Medicare Other

## 2020-05-06 DIAGNOSIS — I442 Atrioventricular block, complete: Secondary | ICD-10-CM

## 2020-05-07 LAB — CUP PACEART REMOTE DEVICE CHECK
Battery Impedance: 1191 Ohm
Battery Remaining Longevity: 50 mo
Battery Voltage: 2.76 V
Brady Statistic AP VP Percent: 31 %
Brady Statistic AP VS Percent: 0 %
Brady Statistic AS VP Percent: 69 %
Brady Statistic AS VS Percent: 0 %
Date Time Interrogation Session: 20220105105248
Implantable Lead Implant Date: 20140331
Implantable Lead Implant Date: 20140331
Implantable Lead Location: 753859
Implantable Lead Location: 753860
Implantable Lead Model: 5076
Implantable Lead Model: 5076
Implantable Pulse Generator Implant Date: 20140331
Lead Channel Impedance Value: 485 Ohm
Lead Channel Impedance Value: 498 Ohm
Lead Channel Pacing Threshold Amplitude: 0.375 V
Lead Channel Pacing Threshold Amplitude: 1.25 V
Lead Channel Pacing Threshold Pulse Width: 0.4 ms
Lead Channel Pacing Threshold Pulse Width: 0.4 ms
Lead Channel Setting Pacing Amplitude: 2 V
Lead Channel Setting Pacing Amplitude: 2.5 V
Lead Channel Setting Pacing Pulse Width: 0.4 ms
Lead Channel Setting Sensing Sensitivity: 2 mV

## 2020-05-21 NOTE — Progress Notes (Signed)
Remote pacemaker transmission.   

## 2020-06-16 ENCOUNTER — Other Ambulatory Visit: Payer: Self-pay | Admitting: *Deleted

## 2020-06-16 ENCOUNTER — Other Ambulatory Visit: Payer: Self-pay

## 2020-06-16 MED ORDER — PRAVASTATIN SODIUM 40 MG PO TABS: 40.0000 mg | ORAL_TABLET | Freq: Every day | ORAL | 3 refills | Status: DC

## 2020-06-16 MED ORDER — AMLODIPINE BESYLATE 5 MG PO TABS: 5.0000 mg | ORAL_TABLET | Freq: Every day | ORAL | 0 refills | Status: DC

## 2020-06-16 NOTE — Telephone Encounter (Signed)
Confirmed with pt that she is changing to this pharmacy. Tamotsu Wiederholt Zimmerman Rumple, CMA

## 2020-06-25 ENCOUNTER — Other Ambulatory Visit: Payer: Self-pay

## 2020-07-01 ENCOUNTER — Ambulatory Visit: Payer: PRIVATE HEALTH INSURANCE | Admitting: Podiatry

## 2020-07-02 ENCOUNTER — Inpatient Hospital Stay (HOSPITAL_COMMUNITY): Payer: Medicare Other

## 2020-07-02 ENCOUNTER — Other Ambulatory Visit: Payer: Self-pay

## 2020-07-02 ENCOUNTER — Encounter (HOSPITAL_BASED_OUTPATIENT_CLINIC_OR_DEPARTMENT_OTHER): Payer: Self-pay

## 2020-07-02 ENCOUNTER — Inpatient Hospital Stay (HOSPITAL_BASED_OUTPATIENT_CLINIC_OR_DEPARTMENT_OTHER)
Admission: EM | Admit: 2020-07-02 | Discharge: 2020-07-04 | DRG: 064 | Disposition: A | Discharge status: Home Health | Payer: Medicare Other | Attending: Neurology | Admitting: Neurology

## 2020-07-02 ENCOUNTER — Emergency Department (HOSPITAL_BASED_OUTPATIENT_CLINIC_OR_DEPARTMENT_OTHER): Payer: Medicare Other

## 2020-07-02 DIAGNOSIS — I69392 Facial weakness following cerebral infarction: Secondary | ICD-10-CM | POA: Diagnosis not present

## 2020-07-02 DIAGNOSIS — Z79899 Other long term (current) drug therapy: Secondary | ICD-10-CM

## 2020-07-02 DIAGNOSIS — I610 Nontraumatic intracerebral hemorrhage in hemisphere, subcortical: Secondary | ICD-10-CM | POA: Diagnosis not present

## 2020-07-02 DIAGNOSIS — I161 Hypertensive emergency: Secondary | ICD-10-CM | POA: Diagnosis not present

## 2020-07-02 DIAGNOSIS — R29701 NIHSS score 1: Secondary | ICD-10-CM | POA: Diagnosis not present

## 2020-07-02 DIAGNOSIS — G43909 Migraine, unspecified, not intractable, without status migrainosus: Secondary | ICD-10-CM | POA: Diagnosis present

## 2020-07-02 DIAGNOSIS — Z7982 Long term (current) use of aspirin: Secondary | ICD-10-CM | POA: Diagnosis not present

## 2020-07-02 DIAGNOSIS — Z8249 Family history of ischemic heart disease and other diseases of the circulatory system: Secondary | ICD-10-CM | POA: Diagnosis not present

## 2020-07-02 DIAGNOSIS — I10 Essential (primary) hypertension: Secondary | ICD-10-CM | POA: Diagnosis present

## 2020-07-02 DIAGNOSIS — I6502 Occlusion and stenosis of left vertebral artery: Secondary | ICD-10-CM | POA: Diagnosis present

## 2020-07-02 DIAGNOSIS — Z950 Presence of cardiac pacemaker: Secondary | ICD-10-CM

## 2020-07-02 DIAGNOSIS — E785 Hyperlipidemia, unspecified: Secondary | ICD-10-CM | POA: Diagnosis present

## 2020-07-02 DIAGNOSIS — G936 Cerebral edema: Secondary | ICD-10-CM | POA: Diagnosis present

## 2020-07-02 DIAGNOSIS — M1990 Unspecified osteoarthritis, unspecified site: Secondary | ICD-10-CM | POA: Diagnosis present

## 2020-07-02 DIAGNOSIS — I619 Nontraumatic intracerebral hemorrhage, unspecified: Secondary | ICD-10-CM | POA: Diagnosis present

## 2020-07-02 DIAGNOSIS — I6509 Occlusion and stenosis of unspecified vertebral artery: Secondary | ICD-10-CM

## 2020-07-02 DIAGNOSIS — W19XXXA Unspecified fall, initial encounter: Secondary | ICD-10-CM | POA: Diagnosis present

## 2020-07-02 DIAGNOSIS — E7849 Other hyperlipidemia: Secondary | ICD-10-CM

## 2020-07-02 DIAGNOSIS — I700 Atherosclerosis of aorta: Secondary | ICD-10-CM | POA: Diagnosis present

## 2020-07-02 DIAGNOSIS — I6529 Occlusion and stenosis of unspecified carotid artery: Secondary | ICD-10-CM

## 2020-07-02 DIAGNOSIS — H538 Other visual disturbances: Secondary | ICD-10-CM | POA: Diagnosis present

## 2020-07-02 DIAGNOSIS — Z20822 Contact with and (suspected) exposure to covid-19: Secondary | ICD-10-CM | POA: Diagnosis present

## 2020-07-02 DIAGNOSIS — I69398 Other sequelae of cerebral infarction: Secondary | ICD-10-CM | POA: Diagnosis not present

## 2020-07-02 DIAGNOSIS — I442 Atrioventricular block, complete: Secondary | ICD-10-CM | POA: Diagnosis present

## 2020-07-02 DIAGNOSIS — I6389 Other cerebral infarction: Secondary | ICD-10-CM | POA: Diagnosis not present

## 2020-07-02 DIAGNOSIS — Z79891 Long term (current) use of opiate analgesic: Secondary | ICD-10-CM

## 2020-07-02 LAB — BASIC METABOLIC PANEL
Anion gap: 11 (ref 5–15)
BUN: 35 mg/dL — ABNORMAL HIGH (ref 8–23)
CO2: 27 mmol/L (ref 22–32)
Calcium: 9.3 mg/dL (ref 8.9–10.3)
Chloride: 103 mmol/L (ref 98–111)
Creatinine, Ser: 1.04 mg/dL — ABNORMAL HIGH (ref 0.44–1.00)
GFR, Estimated: 52 mL/min — ABNORMAL LOW (ref >60)
Glucose, Bld: 102 mg/dL — ABNORMAL HIGH (ref 70–99)
Potassium: 4 mmol/L (ref 3.5–5.1)
Sodium: 141 mmol/L (ref 135–145)

## 2020-07-02 LAB — CBC
HCT: 41.6 % (ref 36.0–46.0)
HCT: 41.2 % (ref 36.0–46.0)
Hemoglobin: 13.8 g/dL (ref 12.0–15.0)
Hemoglobin: 13.7 g/dL (ref 12.0–15.0)
MCH: 30.7 pg (ref 26.0–34.0)
MCH: 30.6 pg (ref 26.0–34.0)
MCHC: 33.2 g/dL (ref 30.0–36.0)
MCHC: 33.3 g/dL (ref 30.0–36.0)
MCV: 92.4 fL (ref 80.0–100.0)
MCV: 92 fL (ref 80.0–100.0)
Platelets: 360 10*3/uL (ref 150–400)
Platelets: 392 K/uL (ref 150–400)
RBC: 4.5 MIL/uL (ref 3.87–5.11)
RBC: 4.48 MIL/uL (ref 3.87–5.11)
RDW: 13.5 % (ref 11.5–15.5)
RDW: 13.4 % (ref 11.5–15.5)
WBC: 10.8 10*3/uL — ABNORMAL HIGH (ref 4.0–10.5)
WBC: 10.4 K/uL (ref 4.0–10.5)
nRBC: 0 % (ref 0.0–0.2)
nRBC: 0 % (ref 0.0–0.2)

## 2020-07-02 LAB — COMPREHENSIVE METABOLIC PANEL WITH GFR
ALT: 18 U/L (ref 0–44)
AST: 23 U/L (ref 15–41)
Albumin: 3.5 g/dL (ref 3.5–5.0)
Alkaline Phosphatase: 49 U/L (ref 38–126)
Anion gap: 13 (ref 5–15)
BUN: 32 mg/dL — ABNORMAL HIGH (ref 8–23)
CO2: 23 mmol/L (ref 22–32)
Calcium: 9.3 mg/dL (ref 8.9–10.3)
Chloride: 106 mmol/L (ref 98–111)
Creatinine, Ser: 0.91 mg/dL (ref 0.44–1.00)
GFR, Estimated: >60 mL/min
Glucose, Bld: 104 mg/dL — ABNORMAL HIGH (ref 70–99)
Potassium: 3.3 mmol/L — ABNORMAL LOW (ref 3.5–5.1)
Sodium: 142 mmol/L (ref 135–145)
Total Bilirubin: 0.8 mg/dL (ref 0.3–1.2)
Total Protein: 6.7 g/dL (ref 6.5–8.1)

## 2020-07-02 LAB — LIPID PANEL
Cholesterol: 204 mg/dL — ABNORMAL HIGH (ref 0–200)
HDL: 65 mg/dL
LDL Cholesterol: 116 mg/dL — ABNORMAL HIGH (ref 0–99)
Total CHOL/HDL Ratio: 3.1 ratio
Triglycerides: 115 mg/dL
VLDL: 23 mg/dL (ref 0–40)

## 2020-07-02 LAB — TROPONIN I (HIGH SENSITIVITY)
Troponin I (High Sensitivity): 12 ng/L (ref <18)
Troponin I (High Sensitivity): 14 ng/L (ref <18)

## 2020-07-02 LAB — APTT
aPTT: 26 seconds (ref 24–36)
aPTT: 27 s (ref 24–36)

## 2020-07-02 LAB — MRSA PCR SCREENING: MRSA by PCR: NEGATIVE

## 2020-07-02 LAB — HEMOGLOBIN A1C
Hgb A1c MFr Bld: 5.8 % — ABNORMAL HIGH (ref 4.8–5.6)
Mean Plasma Glucose: 119.76 mg/dL

## 2020-07-02 LAB — RESP PANEL BY RT-PCR (FLU A&B, COVID) ARPGX2
Influenza A by PCR: NEGATIVE
Influenza B by PCR: NEGATIVE
SARS Coronavirus 2 by RT PCR: NEGATIVE

## 2020-07-02 LAB — PROTIME-INR
INR: 1 (ref 0.8–1.2)
INR: 1 (ref 0.8–1.2)
Prothrombin Time: 12.9 seconds (ref 11.4–15.2)
Prothrombin Time: 12.9 s (ref 11.4–15.2)

## 2020-07-02 LAB — GLUCOSE, CAPILLARY: Glucose-Capillary: 91 mg/dL (ref 70–99)

## 2020-07-02 LAB — CBG MONITORING, ED: Glucose-Capillary: 103 mg/dL — ABNORMAL HIGH (ref 70–99)

## 2020-07-02 MED ORDER — NICARDIPINE HCL IN NACL 20-0.86 MG/200ML-% IV SOLN
0.0000 mg/h | INTRAVENOUS | Status: DC
  Administered 2020-07-02 – 2020-07-03 (×3): 2.5 mg/h via INTRAVENOUS
  Filled 2020-07-02 (×3): qty 200

## 2020-07-02 MED ORDER — ACETAMINOPHEN 325 MG PO TABS: 650.0000 mg | ORAL_TABLET | ORAL | Status: DC | PRN

## 2020-07-02 MED ORDER — SENNOSIDES-DOCUSATE SODIUM 8.6-50 MG PO TABS
1.0000 | ORAL_TABLET | Freq: Two times a day (BID) | ORAL | Status: DC
  Administered 2020-07-03 (×2): 1 via ORAL
  Filled 2020-07-02 (×2): qty 1

## 2020-07-02 MED ORDER — PANTOPRAZOLE SODIUM 40 MG IV SOLR
40.0000 mg | Freq: Every day | INTRAVENOUS | Status: DC
  Administered 2020-07-02 – 2020-07-03 (×2): 40 mg via INTRAVENOUS
  Filled 2020-07-02 (×2): qty 40

## 2020-07-02 MED ORDER — NICARDIPINE HCL IN NACL 20-0.86 MG/200ML-% IV SOLN: 0.0000 mg/h | INTRAVENOUS | Status: DC

## 2020-07-02 MED ORDER — STROKE: EARLY STAGES OF RECOVERY BOOK
Freq: Once | Status: AC
  Filled 2020-07-02: qty 1

## 2020-07-02 MED ORDER — NICARDIPINE HCL IN NACL 20-0.86 MG/200ML-% IV SOLN
3.0000 mg/h | INTRAVENOUS | Status: DC
  Administered 2020-07-02: 5 mg/h via INTRAVENOUS
  Filled 2020-07-02 (×2): qty 200

## 2020-07-02 MED ORDER — ACETAMINOPHEN 160 MG/5ML PO SOLN: 650.0000 mg | ORAL | Status: DC | PRN

## 2020-07-02 MED ORDER — LABETALOL HCL 5 MG/ML IV SOLN
20.0000 mg | Freq: Once | INTRAVENOUS | Status: AC
  Administered 2020-07-02: 20 mg via INTRAVENOUS
  Filled 2020-07-02: qty 4

## 2020-07-02 MED ORDER — ACETAMINOPHEN 650 MG RE SUPP: 650.0000 mg | RECTAL | Status: DC | PRN

## 2020-07-02 NOTE — ED Provider Notes (Signed)
Emergency Department Provider Note   I have reviewed the triage vital signs and the nursing notes.   HISTORY  Chief Complaint Fall   HPI Shelley Black is a 87 y.o. female with past medical history reviewed below including hypertension presents to the emergency department after a fall yesterday at home.  The patient provides some of the history along with her daughter at bedside.  The patient was with her mother when she heard a fall and went to check on her.  She did not appear have struck her head but daughter states that since yesterday has seemed somewhat different than normal.  She seems slower to respond and slightly confused.  The patient tells me that "I feel fine."  She has no complaints.  Denies weakness/numbness.  No prior history of stroke.  She is not anticoagulated.   Past Medical History:  Diagnosis Date  . Arthritis   . Cardiac pacemaker Medtronic   . Complete heart block ([place])   . Heart murmur    not seen by cardiologist  . Hypertension     Patient Active Problem List   Diagnosis Date Noted  . ICH (intracerebral hemorrhage) ([place]) 07/02/2020  . Dizziness and giddiness 02/24/2019  . Double vision 02/24/2019  . Grief 02/24/2019  . UTI (urinary tract infection) 09/06/2018  . Visual changes 01/13/2018  . Focal stenosis LEFT vertebral artery 11/30/2017  . Cardiac pacemaker Medtronic   . Complete heart block ([place]) 07/29/2012  . Cardiac arrest ([place]) 07/29/2012  . Rotator cuff tear 06/21/2012  . S/P TKR (total knee replacement) 12/24/2011  . INSOMNIA 07/16/2009  . OVARIAN CYSTIC MASS 01/31/2009  . Goiter 08/21/2007  . CAROTID ARTERY DISEASE 06/21/2007  . Osteoarthrosis involving lower leg 03/22/2007  . Benign neoplasm of colon 10/31/2006  . Hyperlipidemia 10/31/2006  . Essential hypertension 10/31/2006    Past Surgical History:  Procedure Laterality Date  . APPENDECTOMY    . CATARACT EXTRACTION, BILATERAL    . LEFT HEART CATHETERIZATION WITH  CORONARY ANGIOGRAM N/A 07/29/2012   Procedure: LEFT HEART CATHETERIZATION WITH CORONARY ANGIOGRAM;  Surgeon: Peter M Martinique, MD;  Location: [facility] CATH LAB;  Service: Cardiovascular;  Laterality: N/A;  . PERMANENT PACEMAKER INSERTION N/A 07/31/2012   Procedure: PERMANENT PACEMAKER INSERTION;  Surgeon: Deboraha Sprang, MD;  Location: [facility] CATH LAB;  Service: Cardiovascular;  Laterality: N/A;  . ROTATOR CUFF REPAIR Right   . TEMPORARY PACEMAKER INSERTION  07/29/2012   Procedure: TEMPORARY PACEMAKER INSERTION;  Surgeon: Peter M Martinique, MD;  Location: [facility] CATH LAB;  Service: Cardiovascular;;  . THYROID SURGERY    . TOTAL KNEE ARTHROPLASTY Left   . TOTAL KNEE ARTHROPLASTY  12/24/2011   Procedure: TOTAL KNEE ARTHROPLASTY;  Surgeon: Alta Corning, MD;  Location: [facility];  Service: Orthopedics;  Laterality: Right;  general anesthesia with pre op femoral nerve block    Allergies Patient has no known allergies.  Family History  Problem Relation Age of Onset  . Heart attack Mother   . Heart attack Father   . Other Son        MVA  . Diabetes Neg Hx   . Cancer Neg Hx     Social History Social History   Tobacco Use  . Smoking status: Never Smoker  . Smokeless tobacco: Never Used  Substance Use Topics  . Alcohol use: No  . Drug use: No    Review of Systems  Constitutional: No fever/chills Eyes: No visual changes. ENT: No sore throat. Cardiovascular: Denies chest  pain. Respiratory: Denies shortness of breath. Gastrointestinal: No abdominal pain.  No nausea, no vomiting.  No diarrhea.  No constipation. Genitourinary: Negative for dysuria. Musculoskeletal: Negative for back pain. Skin: Negative for rash. Neurological: Negative for headaches, focal weakness or numbness.  10-point ROS otherwise negative.  ____________________________________________   PHYSICAL EXAM:  VITAL SIGNS: ED Triage Vitals  Enc Vitals Group     BP 07/02/20 1325 133/79     Pulse Rate 07/02/20 1325 71     Resp  07/02/20 1325 18     Temp 07/02/20 1325 98.2 F (36.8 C)     Temp src --      SpO2 07/02/20 1325 98 %     Weight 07/02/20 1328 134 lb (60.8 kg)     Height 07/02/20 1328 5\' 4"  (1.626 m)   Constitutional: Alert and oriented. Well appearing and in no acute distress. Eyes: Conjunctivae are normal. PERRL. Head: Atraumatic. Nose: No congestion/rhinnorhea. Mouth/Throat: Mucous membranes are moist.   Neck: No stridor.   Cardiovascular: Normal rate, regular rhythm. Good peripheral circulation. Grossly normal heart sounds.   Respiratory: Normal respiratory effort.  No retractions. Lungs CTAB. Gastrointestinal: Soft and nontender. No distention.  Musculoskeletal: No lower extremity tenderness nor edema. No gross deformities of extremities. Neurologic:  Normal speech and language. No gross focal neurologic deficits are appreciated.  No facial asymmetry.  5/5 strength with no pronator drift.  Normal sensation. Skin:  Skin is warm, dry and intact. No rash noted.   ____________________________________________   LABS (all labs ordered are listed, but only abnormal results are displayed)  Labs Reviewed  BASIC METABOLIC PANEL - Abnormal; Notable for the following components:      Result Value   Glucose, Bld 102 (*)    BUN 35 (*)    Creatinine, Ser 1.04 (*)    GFR, Estimated 52 (*)    All other components within normal limits  CBC - Abnormal; Notable for the following components:   WBC 10.8 (*)    All other components within normal limits  CBG MONITORING, ED - Abnormal; Notable for the following components:   Glucose-Capillary 103 (*)    All other components within normal limits  RESP PANEL BY RT-PCR (FLU A&B, COVID) ARPGX2  PROTIME-INR  APTT  RAPID URINE DRUG SCREEN, HOSP PERFORMED  URINALYSIS, ROUTINE W REFLEX MICROSCOPIC  TROPONIN I (HIGH SENSITIVITY)  TROPONIN I (HIGH SENSITIVITY)   ____________________________________________  EKG  V-paced rhythm. Similar to prior. No acute  changes.  ____________________________________________  RADIOLOGY  CT Head Wo Contrast  Result Date: 07/02/2020 CLINICAL DATA:  Fall yesterday, head trauma.  Slurred speech now. EXAM: CT HEAD WITHOUT CONTRAST TECHNIQUE: Contiguous axial images were obtained from the base of the skull through the vertex without intravenous contrast. COMPARISON:  Head CT dated 11/24/2017. FINDINGS: Brain: Acute intraparenchymal hematoma within the LEFT basal ganglia region, measuring 2.5 cm greatest dimension, with surrounding edema, with associated minimal rightward midline shift. Chronic small vessel ischemic changes within the periventricular white matter regions bilaterally. Old infarct within the medial LEFT occipital lobe with associated encephalomalacia. Vascular: Chronic calcified atherosclerotic changes of the large vessels at the skull base. No unexpected hyperdense vessel. Skull: Normal. Negative for fracture or focal lesion. Sinuses/Orbits: No acute finding. Other: None. IMPRESSION: 1. Acute intraparenchymal hematoma within the LEFT basal ganglia region, measuring 2.5 cm greatest dimension, with surrounding edema, with associated minimal rightward midline shift. Findings most likely represent hypertensive hemorrhage or hemorrhagic infarction, less likely traumatic hemorrhage based on location. 2.  Chronic ischemic changes within the white matter and LEFT occipital lobe. 3. No extra-axial hemorrhage.  No skull fracture. Critical Value/emergent results were called by telephone at the time of interpretation on 07/02/2020 at 3:24 pm to provider Dr. Laverta Baltimore, who verbally acknowledged these results. Electronically Signed   By: Franki Cabot M.D.   On: 07/02/2020 15:24    ____________________________________________   PROCEDURES  Procedure(s) performed:   .Critical Care Performed by: Margette Fast, MD Authorized by: Margette Fast, MD   Critical care provider statement:    Critical care time (minutes):  45    Critical care time was exclusive of:  Separately billable procedures and treating other patients and teaching time   Critical care was necessary to treat or prevent imminent or life-threatening deterioration of the following conditions:  CNS failure or compromise   Critical care was time spent personally by me on the following activities:  Discussions with consultants, evaluation of patient's response to treatment, examination of patient, ordering and performing treatments and interventions, ordering and review of laboratory studies, ordering and review of radiographic studies, pulse oximetry, re-evaluation of patient's condition, obtaining history from patient or surrogate, review of old charts, development of treatment plan with patient or surrogate and blood draw for specimens   I assumed direction of critical care for this patient from another provider in my specialty: no     Care discussed with: admitting provider       ____________________________________________   INITIAL IMPRESSION / [place] / ED COURSE  Pertinent labs & imaging results that were available during my care of the patient were reviewed by me and considered in my medical decision making (see chart for details).  Shortly after arrival on shift made aware that patient has intracerebral hemorrhage on CT in the waiting room.  Patient was immediately discussed with neurology. No neuro deficits. SBP < 140. Not anticoagulated.   03:05 PM  Spoke with Dr. Lorrin Goodell with neurology regarding the patient's head CT.  Reviewed the images and history with me by phone.  Agrees with [facility] as needed with blood pressure goals per protocol.  Have ordered Covid test and they will accept to their service with admit to [facility] Placed bed request orders at their request.   Discussed patient's case with [facility] to request admission. Patient and family (if present) updated with plan. Care transferred to [facility] service.  I reviewed all nursing  notes, vitals, pertinent old records, EKGs, labs, imaging (as available).  ____________________________________________  FINAL CLINICAL IMPRESSION(S) / ED DIAGNOSES  Final diagnoses:  Left-sided nontraumatic intracerebral hemorrhage, unspecified cerebral location [facility])    MEDICATIONS GIVEN DURING THIS VISIT:  Medications  nicardipine (CARDENE) 20mg  in 0.86% saline 259ml IV infusion (0.1 mg/ml) (has no administration in time range)     Note:  This document was prepared using Dragon voice recognition software and may include unintentional dictation errors.  Nanda Quinton, MD, [facility] Emergency Medicine    Kash Mothershead, Wonda Old[facility], MD 07/02/20 [phone]

## 2020-07-02 NOTE — ED Triage Notes (Addendum)
Pt states was in shower yesterday when she got dizzy and lost her balance and fell at 1000 on 3/1. Denies hitting head. Not on thinners, denies LOC. Daughter states pt having a shuffling gait and slowed speech. LKW 2300 on 2/28. Been feeling dizzy intermittently x several months.  Right sided facial droop. Slight weakness in right hand grip

## 2020-07-02 NOTE — H&P (Signed)
Neurology H&P  [name] MR# [id number] 07/02/2020  CC: hemorrhagic stroke  History is obtained from: Patient and chart  HPI: Shelley Black is a 87 y.o. female right handed PMHx reviewed below, HTN had unwitnessed fall the day PTA at home 07/01/2020. The patient was preparing for a podiatry appointment then suddenly and inexplicably fell. She did not hit her head but her daughter found her down. The following day her daughter noted the patient was acting differently - Slower to respond and slightly confused.  No prior history of stroke.  Patient has history of migraines and suffers daily headaches for which she takes Excedrin migraine. Mother had migraines.  Denies weakness/numbness, changes in hearing, F/C/N/V.    LKW: unclear tpa given?: No, ICH IR Thrombectomy? No, ICH Modified Rankin Scale: 0-Completely asymptomatic and back to baseline post- stroke NIHSS: 1  ROS: A complete ROS was performed and is negative except as noted in the HPI.    Past Medical History:  Diagnosis Date  . Arthritis   . Cardiac pacemaker [employer]   . Complete heart block ([place])   . Heart murmur    not seen by cardiologist  . Hypertension      Family History  Problem Relation Age of Onset  . Heart attack Mother   . Heart attack Father   . Other Son        MVA  . Diabetes Neg Hx   . Cancer Neg Hx    Social History:  reports that she has never smoked. She has never used smokeless tobacco. She reports that she does not drink alcohol and does not use drugs.  Prior to Admission medications   Medication Sig Start Date End Date Taking? Authorizing Provider  amLODipine (NORVASC) 5 MG tablet Take 1 tablet (5 mg total) by mouth daily. Please make overdue appt with Dr. Caryl Comes before anymore refills. Thank you 3rd and Final Attempt 06/16/20   Deboraha Sprang, MD  aspirin 81 MG tablet Take 1 tablet (81 mg total) by mouth daily. 11/25/17   Montine Circle, PA-C  Cholecalciferol (VITAMIN D3) 2000  UNITS TABS Take 2,000 Units by mouth daily.     [provider]  Multiple Vitamin (MULTIVITAMIN WITH MINERALS) TABS Take 1 tablet by mouth daily.    [provider]  Omega-3 Fatty Acids (FISH OIL TRIPLE STRENGTH PO) Take 1 capsule by mouth daily.    [provider]  pravastatin (PRAVACHOL) 40 MG tablet Take 1 tablet (40 mg total) by mouth daily. 06/16/20   Dickie La, MD  QUEtiapine (SEROQUEL) 25 MG tablet Take one half  Or one tab at bedtime 03/04/20   Dickie La, MD  traMADol (ULTRAM) 50 MG tablet TAKE 1 TO 2 TABLETS BY MOUTH ONCE DAILY AS NEEDED EVERY  8  HOURS  -MAX  OF  6  TABLETS  IN  A  DAY 03/21/20   Dickie La, MD   Exam: Current vital signs: BP (!) 134/57   Pulse 66   Temp 97.7 F (36.5 C) (Oral)   Resp 14   Ht 5\' 4"  (1.626 m)   Wt 60.8 kg   SpO2 93%   BMI 23.00 kg/m   Physical Exam  Appears well-developed and well-nourished.  Psych: Affect appropriate to situation Eyes: No scleral injection HENT: No OP obstrucion Head: Normocephalic.  Cardiovascular: Normal rate and regular rhythm.  Respiratory: Effort normal and breath sounds normal to anterior ascultation GI: Soft.  No distension. There is no tenderness.  Skin: WDI  Neuro: Mental Status: Patient is awake, alert, oriented to person, place, month, year, and situation. Patient is able to give a clear and coherent history. Speec fluent, intact comprehension and repetition. No signs of aphasia or neglect. Visual Fields are full. Pupils are equal, round, and reactive to light. EOMI mild left ptosis, no diploplia.  Facial sensation is symmetric to temperature Face right lower droop.  Hearing is intact to voice. Uvula midline, palate elevates symmetrically. Shoulder shrug is symmetric. Tongue is midline without atrophy or fasciculations.  Tone is normal. Bulk is normal. 5/5 strength was present in all four extremities. Sensation is symmetric to light touch and temperature in the arms  and legs. Deep Tendon Reflexes: 2+ and symmetric in the biceps and patellae. Toes are downgoing bilaterally. FNF and HKS are intact bilaterally. Gait - Deferred  I have reviewed labs in epic and the pertinent results are:   Ref. Range 07/02/2020 19:07  Total CHOL/HDL Ratio Latest Units: RATIO 3.1  Cholesterol Latest Ref Range: 0 - 200 mg/dL 204 (H)  HDL Cholesterol Latest Ref Range: >40 mg/dL 65  LDL (calc) Latest Ref Range: 0 - 99 mg/dL 116 (H)  Triglycerides Latest Ref Range: <150 mg/dL 115  VLDL Latest Ref Range: 0 - 40 mg/dL 23    Ref. Range 07/02/2020 19:07  Glucose Latest Ref Range: 70 - 99 mg/dL 104 (H)  Hemoglobin A1C Latest Ref Range: 4.8 - 5.6 % 5.8 (H)    I have reviewed the images obtained: CT head showed stable ~2.5 cm greatest dimension intraparenchymal hematoma within the left basal ganglia with slight interval increase in mass effect with minimal increase in left-to-right midline shift ~28mm compared to earlier scan. Reviewed CTA head and neck 11/24/2017 which showed severe focal narrowing of left vertebral artery at the C5 level, possibly due to mass effect from adjacent facet and uncovertebral hypertrophy. Mild bilateral carotid bifurcation atherosclerotic calcification without hemodynamically significant stenosis and aortic Atherosclerosis   Assessment: Shelley Black is a 87 y.o. female PMHx Cardiac pacemaker, HTN, HLD, carotid artery disease, left vertebral stenosis with unprovoked and unwitnessed fall the day PTA at home now with left basal ganglia intraparenchymal hemorrhagic stroke - Likely hypertensive. Though the NCT head showed minimal midline shift, she is 2 days out and she feels that she has been back to her baseline since yesterday, her neurologic exam remains unchanged since admission.    Impression:  Left basal ganglia intraparenchymal hemorrhagic stroke ICH score 1 HTN HLD Carotid artery disease Left vertebral stenosis Pacemaker NIHSS  1   Plan: Elevate head of bed keep head midline. Continue nicardipine drip for goal SBP<140. Continue to monitor and if there are changes in neurologic exam STAT NCT head and start 3% Na infusion with goal serum Na 145-155. Will need follow up imaging -  If pacemaker is MRI compatible, will need MRI pacemaker protocol. Vascular imaging - MRA head and neck if pacemaker is compatible otherwise consider CTA head and neck or carotid ultrasound. Consult neurosurgery. Sedation, analgesics (fentanyl) PRN. IV fluids gentle hydration. Hold antiplatelets/anticoagulation for now. Continue statin. Replete electrolytes as needed. Normothermia - Acetaminophen for temperature >37.5C Euglycemia (~ <180) Euvolemia - Strict I/Os Precautions: Airway and herniation, seizure, aspiration. PPx: SCDs for now, Senna/docusate, PPI.  This patient is critically ill and at significant risk of neurological worsening, death and care requires constant monitoring of vital signs, hemodynamics,respiratory and cardiac monitoring, neurological assessment, discussion with family, other specialists and medical decision making of high complexity. I  spent 75 minutes of neurocritical care time  in the care of  this patient. This was time spent independent of any time provided by nurse practitioner or PA.  Lynnae Sandhoff, MD Page: [phone]

## 2020-07-02 NOTE — ED Notes (Signed)
Dr. Langston Masker evaluating pt in triage

## 2020-07-03 ENCOUNTER — Inpatient Hospital Stay (HOSPITAL_COMMUNITY): Payer: Medicare Other

## 2020-07-03 ENCOUNTER — Other Ambulatory Visit: Payer: Self-pay

## 2020-07-03 ENCOUNTER — Encounter (HOSPITAL_COMMUNITY): Payer: Self-pay | Admitting: Neurology

## 2020-07-03 DIAGNOSIS — I610 Nontraumatic intracerebral hemorrhage in hemisphere, subcortical: Principal | ICD-10-CM

## 2020-07-03 DIAGNOSIS — I10 Essential (primary) hypertension: Secondary | ICD-10-CM

## 2020-07-03 DIAGNOSIS — R29701 NIHSS score 1: Secondary | ICD-10-CM

## 2020-07-03 DIAGNOSIS — I6502 Occlusion and stenosis of left vertebral artery: Secondary | ICD-10-CM

## 2020-07-03 DIAGNOSIS — Z950 Presence of cardiac pacemaker: Secondary | ICD-10-CM

## 2020-07-03 DIAGNOSIS — I6523 Occlusion and stenosis of bilateral carotid arteries: Secondary | ICD-10-CM

## 2020-07-03 DIAGNOSIS — I6389 Other cerebral infarction: Secondary | ICD-10-CM

## 2020-07-03 DIAGNOSIS — I700 Atherosclerosis of aorta: Secondary | ICD-10-CM

## 2020-07-03 DIAGNOSIS — E785 Hyperlipidemia, unspecified: Secondary | ICD-10-CM

## 2020-07-03 DIAGNOSIS — I619 Nontraumatic intracerebral hemorrhage, unspecified: Secondary | ICD-10-CM

## 2020-07-03 LAB — BASIC METABOLIC PANEL
Anion gap: 10 (ref 5–15)
BUN: 29 mg/dL — ABNORMAL HIGH (ref 8–23)
CO2: 25 mmol/L (ref 22–32)
Calcium: 8.7 mg/dL — ABNORMAL LOW (ref 8.9–10.3)
Chloride: 106 mmol/L (ref 98–111)
Creatinine, Ser: 0.76 mg/dL (ref 0.44–1.00)
GFR, Estimated: >60 mL/min (ref >60)
Glucose, Bld: 102 mg/dL — ABNORMAL HIGH (ref 70–99)
Potassium: 3.4 mmol/L — ABNORMAL LOW (ref 3.5–5.1)
Sodium: 141 mmol/L (ref 135–145)

## 2020-07-03 LAB — CBC
HCT: 35.8 % — ABNORMAL LOW (ref 36.0–46.0)
Hemoglobin: 12.4 g/dL (ref 12.0–15.0)
MCH: 31.7 pg (ref 26.0–34.0)
MCHC: 34.6 g/dL (ref 30.0–36.0)
MCV: 91.6 fL (ref 80.0–100.0)
Platelets: 337 10*3/uL (ref 150–400)
RBC: 3.91 MIL/uL (ref 3.87–5.11)
RDW: 13.6 % (ref 11.5–15.5)
WBC: 7.1 10*3/uL (ref 4.0–10.5)
nRBC: 0 % (ref 0.0–0.2)

## 2020-07-03 LAB — URINALYSIS, ROUTINE W REFLEX MICROSCOPIC
Bilirubin Urine: NEGATIVE
Glucose, UA: NEGATIVE mg/dL
Hgb urine dipstick: NEGATIVE
Ketones, ur: 5 mg/dL — AB
Leukocytes,Ua: NEGATIVE
Nitrite: NEGATIVE
Protein, ur: 30 mg/dL — AB
Specific Gravity, Urine: 1.024 (ref 1.005–1.030)
pH: 5 (ref 5.0–8.0)

## 2020-07-03 LAB — RAPID URINE DRUG SCREEN, HOSP PERFORMED
Amphetamines: NOT DETECTED
Barbiturates: NOT DETECTED
Benzodiazepines: NOT DETECTED
Cocaine: NOT DETECTED
Opiates: NOT DETECTED
Tetrahydrocannabinol: NOT DETECTED

## 2020-07-03 LAB — ECHOCARDIOGRAM COMPLETE
Area-P 1/2: 3.85 cm2
Height: 64 in
S' Lateral: 2.9 cm
Weight: 2144 oz

## 2020-07-03 MED ORDER — HYDRALAZINE HCL 20 MG/ML IJ SOLN
5.0000 mg | INTRAMUSCULAR | Status: DC | PRN
  Administered 2020-07-03 (×2): 5 mg via INTRAVENOUS
  Filled 2020-07-03 (×2): qty 1

## 2020-07-03 MED ORDER — IOHEXOL 350 MG/ML SOLN
75.0000 mL | Freq: Once | INTRAVENOUS | Status: AC | PRN
  Administered 2020-07-03: 75 mL via INTRAVENOUS

## 2020-07-03 MED ORDER — LABETALOL HCL 5 MG/ML IV SOLN
5.0000 mg | INTRAVENOUS | Status: DC | PRN
  Administered 2020-07-03: 5 mg via INTRAVENOUS
  Filled 2020-07-03: qty 4

## 2020-07-03 MED ORDER — CHLORHEXIDINE GLUCONATE CLOTH 2 % EX PADS
6.0000 | MEDICATED_PAD | Freq: Every day | CUTANEOUS | Status: DC
  Administered 2020-07-03 – 2020-07-04 (×3): 6 via TOPICAL

## 2020-07-03 MED ORDER — AMLODIPINE BESYLATE 5 MG PO TABS
5.0000 mg | ORAL_TABLET | Freq: Every day | ORAL | Status: DC
Start: 2020-07-03 — End: 2020-07-04
  Administered 2020-07-03 – 2020-07-04 (×2): 5 mg via ORAL
  Filled 2020-07-03 (×2): qty 1

## 2020-07-03 NOTE — Progress Notes (Addendum)
STROKE TEAM PROGRESS NOTE   INTERVAL HISTORY Her daughter is at the bedside. She reports that she is feeling fine today and asking about when she can be discharged. She reports that she was getting ready to go to a Podiatry appointment on 3/1 and she fell. She reports that she did not hit her head and that she did not lose consciousness. She reports that her daughter found her down but she did not want to go to the hospital. She reports that yesterday her daughter thought she was acting confused and slower to respond to questions. She was then brought into the hospital and found to have a intraparenchymal hematoma within the left basal ganglia. Neuro exam is grossly fine with some mild Right sided facial drop and longstanding Right Visual Field deficit from previous stroke in PCA.   Spoke with patients daughter Maudie Mercury [phone]. Updated her on imaging findings. She reports that her mother appears to be back to baseline. Enquired about possible discharge. Discussed transitioning her mother off of IV BP control medications and if her pressures remain well controlled she could leave as soon as tomorrow.  She had no other concerns.   Vitals:   07/03/20 0800 07/03/20 0815 07/03/20 0830 07/03/20 1032  BP: 130/61 (!) 138/50 (!) 136/44 (!) 100/92  Pulse: 65 60 62   Resp: 12 12 12    Temp:      TempSrc:      SpO2: 97% 98% 98% 95%  Weight:      Height:       CBC:  Recent Labs  Lab 07/02/20 1907 07/03/20 0849  WBC 10.4 7.1  HGB 13.7 12.4  HCT 41.2 35.8*  MCV 92.0 91.6  PLT 392 355   Basic Metabolic Panel:  Recent Labs  Lab 07/02/20 1907 07/03/20 0849  NA 142 141  K 3.3* 3.4*  CL 106 106  CO2 23 25  GLUCOSE 104* 102*  BUN 32* 29*  CREATININE 0.91 0.76  CALCIUM 9.3 8.7*   Lipid Panel:  Recent Labs  Lab 07/02/20 1907  CHOL 204*  TRIG 115  HDL 65  CHOLHDL 3.1  VLDL 23  LDLCALC 116*   HgbA1c:  Recent Labs  Lab 07/02/20 1907  HGBA1C 5.8*   Urine Drug Screen:  Recent Labs   Lab 07/03/20 0205  LABOPIA NONE DETECTED  COCAINSCRNUR NONE DETECTED  LABBENZ NONE DETECTED  AMPHETMU NONE DETECTED  THCU NONE DETECTED  LABBARB NONE DETECTED    Alcohol Level No results for input(s): ETH in the last 168 hours.  IMAGING past 24 hours CT HEAD WO CONTRAST  Result Date: 07/02/2020 CLINICAL DATA:  Intracranial hemorrhage EXAM: CT HEAD WITHOUT CONTRAST TECHNIQUE: Contiguous axial images were obtained from the base of the skull through the vertex without intravenous contrast. COMPARISON:  07/02/2020 FINDINGS: Brain: Intraparenchymal hematoma within the left basal ganglia is stable measuring 2.5 cm in greatest dimension. Moderate surrounding cytotoxic edema is stable. Small focus of adjacent intraparenchymal hemorrhage within the anterior limb of the left internal capsule is unchanged. Mild mass effect with slight effacement of the sylvian fissure is stable. Mild left-to-right midline shift measuring 4 mm has slightly increased since prior examination Remote infarcts within the right basal ganglia, left occipital lobe, and left cerebellum are again identified. Mild parenchymal volume loss is commensurate with the patient's age. Mild bilateral superimposed periventricular white matter changes are present likely reflecting the sequela of small vessel ischemia. Ventricular size is normal. No interval hemorrhage Vascular: No asymmetric hyperdense vasculature at  the skull base. Skull: Intact Sinuses/Orbits: The visualized paranasal sinuses are clear. The visualized orbits are unremarkable. Other: The mastoid air cells and middle ear cavities are clear. IMPRESSION: Stable intraparenchymal hematoma within the left basal ganglia with slight interval increase in mass effect with minimal increase in left-to-right midline shift, now 4 mm. No interval hemorrhage. Electronically Signed   By: Fidela Salisbury MD   On: 07/02/2020 23:11   CT Head Wo Contrast  Result Date: 07/02/2020 CLINICAL DATA:  Fall  yesterday, head trauma.  Slurred speech now. EXAM: CT HEAD WITHOUT CONTRAST TECHNIQUE: Contiguous axial images were obtained from the base of the skull through the vertex without intravenous contrast. COMPARISON:  Head CT dated 11/24/2017. FINDINGS: Brain: Acute intraparenchymal hematoma within the LEFT basal ganglia region, measuring 2.5 cm greatest dimension, with surrounding edema, with associated minimal rightward midline shift. Chronic small vessel ischemic changes within the periventricular white matter regions bilaterally. Old infarct within the medial LEFT occipital lobe with associated encephalomalacia. Vascular: Chronic calcified atherosclerotic changes of the large vessels at the skull base. No unexpected hyperdense vessel. Skull: Normal. Negative for fracture or focal lesion. Sinuses/Orbits: No acute finding. Other: None. IMPRESSION: 1. Acute intraparenchymal hematoma within the LEFT basal ganglia region, measuring 2.5 cm greatest dimension, with surrounding edema, with associated minimal rightward midline shift. Findings most likely represent hypertensive hemorrhage or hemorrhagic infarction, less likely traumatic hemorrhage based on location. 2. Chronic ischemic changes within the white matter and LEFT occipital lobe. 3. No extra-axial hemorrhage.  No skull fracture. Critical Value/emergent results were called by telephone at the time of interpretation on 07/02/2020 at 3:24 pm to provider Dr. Laverta Baltimore, who verbally acknowledged these results. Electronically Signed   By: Franki Cabot M.D.   On: 07/02/2020 15:24    PHYSICAL EXAM Blood pressure (!) 152/51, pulse 64, temperature 98 F (36.7 C), temperature source Oral, resp. rate 10, height 5\' 4"  (1.626 m), weight 60.8 kg, SpO2 95 %.   General: Alert and oriented, elderly caucasian female, no apparent distress  Lungs: Symmetrical chest rise, no labored breathing  Cardio: Normal Rate and Rhythm  Abdomen: Soft, non-tender  Neuro: Alert, oriented,  thought content appropriate.  Diminished attention, registration and recall.  Poor insight into her condition.  Speech fluent without evidence of aphasia.  Able to follow all commands without difficulty. Cranial Nerves: II:  Visual fields grossly normal, pupils equal, round, reactive to light and accommodation III,IV, VI: ptosis not present, extra-ocular motions intact bilaterally V,VII: smile symmetric, facial light touch sensation normal bilaterally VIII: hearing normal bilaterally IX,X: uvula rises symmetrically XI: bilateral shoulder shrug XII: midline tongue extension without atrophy or fasciculations  Motor: Upper and Lower Extremities 5/5 bilaterally Tone and bulk:normal tone throughout; no atrophy noted.  Diminished fine finger movements on the right.  Orbits left over right upper extremity. Sensory: light touch intact throughout, bilaterally Coordination: normal finger-to-nose Gait: Deferred     ASSESSMENT/PLAN Ms. Cornesha Radziewicz is a 87 y.o. female with history of HTN, pacemaker, and carotid artery disease presenting with mild confusion and slow response times. Monday 2/28 the patient was preparing for a podiatry appointment then suddenly and inexplicably fell. Shereports she did not hit her head but her daughterfound her down. At the time she said she was fine and refused to go to the hospital. The following day her daughternoted the patient was actingdifferently and insisted on bringing her into the hospital.  Today she is minimizing her symptoms but reports doing great and wanting to be  discharged. On her neuro exam she was compensating for her visual field deficit in her Right eye but this is from her past stroke as she has well learned compensating techniques. She has some mild Right sided facial droop. Will stop Nicardipine gtt and start Hydralazine and Labetalol PRN. If BP remains stable could discharge tomorrow.  Left basal gangliaintraparenchymal hemorrhage likely  due to hypertension.  CT head showed stable~2.5 cm greatest dimension intraparenchymal hematoma within the left basal ganglia with slight interval increase in mass effect with minimal increase in left-to-right midline shift~21mmcompared to earlier scan. Reviewed CTA head and neck 11/24/2017 which showed severe focalnarrowing of left vertebral artery at the C5 level, possibly due to mass effect from adjacent facet and uncovertebral hypertrophy. Mild bilateral carotid bifurcation atherosclerotic calcification without hemodynamically significant stenosisand aortic Atherosclerosis  CTA head & neck no large vessel stenosis, occlusion or aneurysm.  Mild atheromatous changes at bilateral carotid bifurcation.  MRI/MRA- unable to obtain due to MRI unsafe Pacemaker  2D Echo- EF 50-55% The left ventricle has low normal function. The left ventricle has no regional wall motion abnormalities. No regional wall motion abnormalities.  LDL 116  HgbA1c 5.8  VTE prophylaxis - SCD's    Diet   Diet Heart Room service appropriate? Yes; Fluid consistency: Thin     aspirin 81 mg daily prior to admission, now on No antithrombotic.   Therapy recommendations:  Unable to be seen today  Disposition:  Awaiting further testing  Hypertension  Home meds:  Amlodipine 5mg    Stopped Nicardipine gtt   Start Hydralazine 5 mg IV q4 PRN SBP> 140   Start Labetalol 5 mg IV q2 PRN SBP> 140  063K-160/10-93 systolic overnight   BP goal less than 160  Long-term BP goal normotensive  Hyperlipidemia  Home meds:  Pravastatin 40mg  resumed in hospital  LDL 116, goal < 70,   Continue statin at discharge  Other Stroke Risk Factors  Advanced Age >/= 63   Coronary artery disease  Hospital day # 1  I have personally obtained history,examined this patient, reviewed notes, independently viewed imaging studies, participated in medical decision making and plan of care.ROS completed by me personally and pertinent  positives fully documented  I have made any additions or clarifications directly to the above note. Agree with note above.. She presented with sudden mental status changes due to left basal ganglia hemorrhage likely of hypertensive etiology.  Recommend close neurological monitoring and strict blood pressure control as per intracerebral hemorrhage protocol.  Keep systolic blood pressure goal below 140 for first 24 hours and then below 160.  Wean off Cleviprex drip similes as needed IV labetalol and hydralazine as well as start oral medication amlodipine.  Long discussion patient developed several minutes of questions. This patient is critically ill and at significant risk of neurological worsening, death and care requires constant monitoring of vital signs, hemodynamics,respiratory and cardiac monitoring, extensive review of multiple databases, frequent neurological assessment, discussion with family, other specialists and medical decision making of high complexity.I have made any additions or clarifications directly to the above note.This critical care time does not reflect procedure time, or teaching time or supervisory time of PA/NP/Med Resident etc but could involve care discussion time.  I spent 30 minutes of neurocritical care time  in the care of  this patient.     Antony Contras, MD Medical Director [facility] Pager: [phone] 07/03/2020 4:17 PM  To contact Stroke Continuity provider, please refer to [email or web address] After hours, contact  General Neurology

## 2020-07-03 NOTE — Progress Notes (Signed)
PT Cancellation Note  Patient Details Name: Shelley Black MRN: [id number] DOB: [birth year]   Cancelled Treatment:    Reason Eval/Treat Not Completed: Active bedrest order   [name] [name] 07/03/2020, 6:58 AM  [name], PT Acute Rehabilitation Services Pager: [phone] Office: [phone]

## 2020-07-03 NOTE — Progress Notes (Signed)
Occupational Therapy Evaluation  Occupational Therapy Evaluation  PTA pt lives with her daughter/family and was independent with ADL and mobility, including managing her own medication and finances. Pt with unsteady gait at times without use of AD and demonstrates deficits as listed below. Pt states her daughters can be with her after DC. At this time recommend S with ADL/IADL tasks and mobility to reduce risk of falls. Recommend direct S with medication and financial management and follow up with [place]. As noted in PT note, pt had difficulty locating items in room at times - unsure if R visual field involved. May benefit from following up with her eye doctor for a Full [employer]. Will follow acutely. See PT note for vitals.    07/03/20 1100  OT Visit Information  Last OT Received On 07/03/20  Assistance Needed +1  PT/OT/SLP Co-Evaluation/Treatment Yes  Reason for Co-Treatment For patient/therapist safety  OT goals addressed during session ADL's and self-care  History of Present Illness 87 yo admitted 3/2 with left basal ganglia hemorrhage and fall without hitting head on 3/1. PMhx: HTN, pacemaker, heart block, arthritis, TKA  Precautions  Precautions Fall  Precaution Comments sBP <140  Home Living  Family/patient expects to be discharged to: Private residence  Living Arrangements Children  Available Help at Discharge Family;Available PRN/intermittently  Type of Home House  Home Access Level entry  Home Layout Two level  Bathroom Shower/Tub Walk-in shower  Bathroom Toilet Handicapped height  Bathroom Accessibility Yes  How Accessible Accessible via walker  [place] - 2 wheels  Prior Function  Level of Independence Independent  Comments used a RW after fall 3/1; manages own medication and finances  Communication  Communication No difficulties  Pain Assessment  Pain Assessment No/denies pain  Cognition  Arousal/Alertness Awake/alert  Behavior During  Therapy WFL for tasks assessed/performed  Overall Cognitive Status Impaired/Different from baseline  Area of Impairment Memory;Attention;Safety/judgement;Awareness;Problem solving  Current Attention Level Sustained  Memory Decreased short-term memory  Safety/Judgement Decreased awareness of safety;Decreased awareness of deficits  Awareness Emergent  Problem Solving Slow processing  General Comments difficulty locating items in room at times; Scored a 2 on the Short Blessed Test of Memory and Concentration which places pt in the "Normal Cognition" category  Upper Extremity Assessment  Upper Extremity Assessment Overall WFL for tasks assessed (using R UE without apparent difficulty)  Lower Extremity Assessment  Lower Extremity Assessment Defer to PT evaluation  Cervical / Trunk Assessment  Cervical / Trunk Assessment Normal  ADL  Overall ADL's  Needs assistance/impaired  Eating/Feeding Set up  Grooming Min guard;Standing  Upper Body Bathing Sitting  Lower Body Bathing Min guard;Sit to/from stand  Upper Body Dressing  Set up;Sitting  Lower Body Dressing Minimal assistance;Sit to/from stand  Lower Body Dressing Details (indicate cue type and reason) difficulty donning socks  Toilet Transfer Minimal assistance  Toilet Transfer Details (indicate cue type and reason) unsteady initially  Toileting- Clothing Manipulation and Hygiene Min guard  Functional mobility during ADLs Minimal assistance;Cueing for safety  Vision- History  Baseline Vision/History Wears glasses  Wears Glasses At all times  Patient Visual Report No change from baseline  Vision- Assessment  Vision Assessment? Yes;Vision impaired- to be further tested in functional context  Eye Alignment [facility]  Alignment/Gaze Preference WDL  Tracking/Visual Pursuits Decreased smoothness of horizontal tracking;Decreased smoothness of vertical tracking  Saccades Decreased speed of saccadic movement  Convergence WFL  Visual Fields  Impaired-to be further tested in functional context  Additional  Comments Difficulty locating recliner, trash can and soap dispenser in room  Perception  Comments Will further assess  Praxis  Praxis tested? WFL  Bed Mobility  Overal bed mobility Needs Assistance  Bed Mobility Supine to Sit  Supine to sit Min guard;HOB elevated  Transfers  Overall transfer level Needs assistance  Transfers Sit to/from Stand  Sit to Stand Min assist (from toilet)  Balance  Overall balance assessment Needs assistance  Sitting balance-Leahy Scale Good  Standing balance-Leahy Scale Fair  Standing balance comment had fall PTA; improved once pt up and walking  OT - End of Session  Equipment Utilized During Treatment Gait belt  Activity Tolerance Patient tolerated treatment well  Patient left in chair;with call bell/phone within reach;with chair alarm set  Nurse Communication Mobility status;Other (comment) (DC recommendations)  OT Assessment  OT Recommendation/Assessment Patient needs continued OT Services  OT Visit Diagnosis Unsteadiness on feet (R26.81);Other abnormalities of gait and mobility (R26.89);Muscle weakness (generalized) (M62.81);History of falling (Z91.81);Other symptoms and signs involving cognitive function  OT Problem List Decreased activity tolerance;Impaired balance (sitting and/or standing);Impaired vision/perception;Decreased cognition;Decreased safety awareness;Decreased knowledge of use of DME or AE  OT Plan  OT Frequency (ACUTE ONLY) Min 2X/week  OT Treatment/Interventions (ACUTE ONLY) Self-care/ADL training;Therapeutic exercise;Neuromuscular education;DME and/or AE instruction;Therapeutic activities;Cognitive remediation/compensation;Visual/perceptual remediation/compensation;Patient/family education;Balance training  AM-PAC OT "6 Clicks" Daily Activity Outcome Measure (Version 2)  Help from another person eating meals? 4  Help from another person taking care of personal grooming?  3  Help from another person toileting, which includes using toliet, bedpan, or urinal? 3  Help from another person bathing (including washing, rinsing, drying)? 3  Help from another person to put on and taking off regular upper body clothing? 3  Help from another person to put on and taking off regular lower body clothing? 3  6 Click Score 19  OT Recommendation  Follow Up Recommendations Home health OT;Other (comment) (S with mobility, ADL adn IADL tasks initially)  OT Equipment None recommended by OT  Individuals Consulted  Consulted and Agree with Results and Recommendations Patient  Acute Rehab OT Goals  Patient Stated Goal return home to crochet  OT Goal Formulation With patient  Time For Goal Achievement 07/17/20  Potential to Achieve Goals Good  OT Time Calculation  OT Start Time (ACUTE ONLY) 1015  OT Stop Time (ACUTE ONLY) 1046  OT Time Calculation (min) 31 min  OT General Charges  $OT Visit 1 Visit  OT Evaluation  $OT Eval Moderate Complexity 1 Mod  Written Expression  Dominant Hand Right  Maurie Boettcher, OT/L   Acute OT Clinical Specialist Acute Rehabilitation Services Pager [phone] Office [phone]

## 2020-07-03 NOTE — Evaluation (Addendum)
Physical Therapy Evaluation Patient Details Name: Shelley Black MRN: [id number] DOB: 20-Mar-1931 Today's Date: 07/03/2020   History of Present Illness  87 yo admitted 3/2 with left basal ganglia hemorrhage and fall without hitting head on 3/1. PMhx: HTN, pacemaker, heart block, arthritis, TKA  Clinical Impression  Pt pleasant and eager to get OOB to bathroom. Pt with good strength and sitting balance but unable to don sock in sitting as she normally does. Pt with decreased STM and attention with noted missing of targets with cues to find doorways, trash can, recliner and soap dispenser pt required increased time and scanning to locate items. Pt with good strength and overall mobility with noted balance deficits and currently recommend 24hr assist and HHPT. Pt with above and below deficits who will benefit from acute therapy to maximize mobility, safety and function and decrease fall risk.   BP supine 135/54 Sitting 133/62 Standing 110/92 HR 67    Follow Up Recommendations Home health PT;Supervision/Assistance - 24 hour    Equipment Recommendations  None recommended by PT    Recommendations for Other Services       Precautions / Restrictions Precautions Precautions: Fall;Other (comment) Precaution Comments: sBP <140      Mobility  Bed Mobility Overal bed mobility: Needs Assistance Bed Mobility: Supine to Sit     Supine to sit: Min guard;HOB elevated     General bed mobility comments: HOB 25 degrees with use of rail, guarding for lines and safety    Transfers Overall transfer level: Needs assistance   Transfers: Sit to/from Stand Sit to Stand: Min guard         General transfer comment: guarding for lines and safety from bed and toilet and to recliner  Ambulation/Gait Ambulation/Gait assistance: Min guard Gait Distance (Feet): 150 Feet Assistive device: None Gait Pattern/deviations: Step-through pattern;Decreased stride length   Gait velocity interpretation:  >2.62 ft/sec, indicative of community ambulatory General Gait Details: slightly unsteady gait with decreased speed, cues for direction with pt with decreased attention to visual cues and missing doors. Pt able to find room but could not recall room number  Stairs            Wheelchair Mobility    Modified Rankin (Stroke Patients Only) Modified Rankin (Stroke Patients Only) Pre-Morbid Rankin Score: No symptoms Modified Rankin: Moderately severe disability     Balance Overall balance assessment: Needs assistance   Sitting balance-Leahy Scale: Good       Standing balance-Leahy Scale: Good   Single Leg Stance - Right Leg: 1 Single Leg Stance - Left Leg: 1       [place] - Eyes Closed: 15   High Level Balance Comments: pt able to turn head with gait with vertical and horizontal turns. dual task with gait with noted slight imbalance             Pertinent Vitals/Pain Pain Assessment: No/denies pain    Home Living Family/patient expects to be discharged to:: Private residence Living Arrangements: Children Available Help at Discharge: Family;Available PRN/intermittently Type of Home: House Home Access: Level entry     Home Layout: Two level Home Equipment: Walker - 2 wheels      Prior Function Level of Independence: Independent         Comments: used a RW after fall 3/1     Hand Dominance        Extremity/Trunk Assessment   Upper Extremity Assessment Upper Extremity Assessment: Defer to OT evaluation    Lower Extremity Assessment  Lower Extremity Assessment: Overall WFL for tasks assessed    Cervical / Trunk Assessment Cervical / Trunk Assessment: Normal  Communication   Communication: No difficulties  Cognition Arousal/Alertness: Awake/alert Behavior During Therapy: WFL for tasks assessed/performed Overall Cognitive Status: Impaired/Different from baseline Area of Impairment: Memory;Safety/judgement;Attention;Awareness;Problem solving                    Current Attention Level: Sustained Memory: Decreased short-term memory   Safety/Judgement: Decreased awareness of safety;Decreased awareness of deficits   Problem Solving: Slow processing        General Comments      Exercises     Assessment/Plan    PT Assessment Patient needs continued PT services  PT Problem List Decreased mobility;Decreased activity tolerance;Decreased balance;Decreased cognition       PT Treatment Interventions Gait training;Balance training;Functional mobility training;Therapeutic activities;Patient/family education;Cognitive remediation;Neuromuscular re-education    PT Goals (Current goals can be found in the Care Plan section)  Acute Rehab PT Goals Patient Stated Goal: return home to crochet PT Goal Formulation: With patient Time For Goal Achievement: 07/17/20 Potential to Achieve Goals: Good    Frequency Min 4X/week   Barriers to discharge        Co-evaluation               AM-PAC PT "6 Clicks" Mobility  Outcome Measure Help needed turning from your back to your side while in a flat bed without using bedrails?: None Help needed moving from lying on your back to sitting on the side of a flat bed without using bedrails?: A Little Help needed moving to and from a bed to a chair (including a wheelchair)?: A Little Help needed standing up from a chair using your arms (e.g., wheelchair or bedside chair)?: A Little Help needed to walk in hospital room?: A Little Help needed climbing 3-5 steps with a railing? : A Little 6 Click Score: 19    End of Session Equipment Utilized During Treatment: Gait belt Activity Tolerance: Patient tolerated treatment well Patient left: in chair;with call bell/phone within reach;with chair alarm set Nurse Communication: Mobility status PT Visit Diagnosis: Other abnormalities of gait and mobility (R26.89)    Time: 0923-3007 PT Time Calculation (min) (ACUTE ONLY): 31 min   Charges:    PT Evaluation $PT Eval Moderate Complexity: 1 Mod          Maija P, PT Acute Rehabilitation Services Pager: [phone] Office: [place] 07/03/2020, 10:57 AM

## 2020-07-03 NOTE — Progress Notes (Signed)
  Echocardiogram 2D Echocardiogram has been performed.  Shelley Black 07/03/2020, 9:24 AM

## 2020-07-03 NOTE — Progress Notes (Signed)
OT Cancellation Note  Patient Details Name: Shelley Black MRN: [id number] DOB: 07-25-1930   Cancelled Treatment:    Reason Eval/Treat Not Completed: Active bedrest order  [place], OT/L   Acute OT Clinical Specialist Acute Rehabilitation Services Pager [phone] Office [phone]  07/03/2020, 7:48 AM

## 2020-07-03 NOTE — Progress Notes (Deleted)
Occupational Therapy Evaluation  PTA pt lives with her daughter/family and was independent with ADL and mobility, including managing her own medication and finances. Pt with unsteady gait at times without use of AD and demonstrates deficits as listed below. Pt states her daughters can be with her after DC. At this time recommend S with ADL/IADL tasks and mobility to reduce risk of falls. Recommend direct S with medication and financial management and follow up with [place]. As noted in PT note, pt had difficulty locating items in room at times - unsure if R visual field involved. May benefit from following up with her eye doctor for a Full [employer]. Will follow acutely. See PT note for vitals.    07/03/20 1100  OT Visit Information  Last OT Received On 07/03/20  Assistance Needed +1  PT/OT/SLP Co-Evaluation/Treatment Yes  Reason for Co-Treatment For patient/therapist safety  OT goals addressed during session ADL's and self-care  History of Present Illness 87 yo admitted 3/2 with left basal ganglia hemorrhage and fall without hitting head on 3/1. PMhx: HTN, pacemaker, heart block, arthritis, TKA  Precautions  Precautions Fall  Precaution Comments sBP <140  Home Living  Family/patient expects to be discharged to: Private residence  Living Arrangements Children  Available Help at Discharge Family;Available PRN/intermittently  Type of Home House  Home Access Level entry  Home Layout Two level  Bathroom Shower/Tub Walk-in shower  Bathroom Toilet Handicapped height  Bathroom Accessibility Yes  How Accessible Accessible via walker  [place] - 2 wheels  Prior Function  Level of Independence Independent  Comments used a RW after fall 3/1; manages own medication and finances  Communication  Communication No difficulties  Pain Assessment  Pain Assessment No/denies pain  Cognition  Arousal/Alertness Awake/alert  Behavior During Therapy WFL for tasks  assessed/performed  Overall Cognitive Status Impaired/Different from baseline  Area of Impairment Memory;Attention;Safety/judgement;Awareness;Problem solving  Current Attention Level Sustained  Memory Decreased short-term memory  Written Expression  Dominant Hand Right  Maurie Boettcher, OT/L   Acute OT Clinical Specialist Acute Rehabilitation Services Pager [phone] Office [phone]

## 2020-07-04 ENCOUNTER — Encounter: Payer: Self-pay | Admitting: Family Medicine

## 2020-07-04 LAB — CBC
HCT: 34.2 % — ABNORMAL LOW (ref 36.0–46.0)
Hemoglobin: 11.5 g/dL — ABNORMAL LOW (ref 12.0–15.0)
MCH: 31.3 pg (ref 26.0–34.0)
MCHC: 33.6 g/dL (ref 30.0–36.0)
MCV: 93.2 fL (ref 80.0–100.0)
Platelets: 297 10*3/uL (ref 150–400)
RBC: 3.67 MIL/uL — ABNORMAL LOW (ref 3.87–5.11)
RDW: 13.7 % (ref 11.5–15.5)
WBC: 9.4 10*3/uL (ref 4.0–10.5)
nRBC: 0 % (ref 0.0–0.2)

## 2020-07-04 LAB — BASIC METABOLIC PANEL
Anion gap: 11 (ref 5–15)
BUN: 30 mg/dL — ABNORMAL HIGH (ref 8–23)
CO2: 22 mmol/L (ref 22–32)
Calcium: 8.4 mg/dL — ABNORMAL LOW (ref 8.9–10.3)
Chloride: 108 mmol/L (ref 98–111)
Creatinine, Ser: 0.96 mg/dL (ref 0.44–1.00)
GFR, Estimated: 57 mL/min — ABNORMAL LOW (ref >60)
Glucose, Bld: 96 mg/dL (ref 70–99)
Potassium: 3.3 mmol/L — ABNORMAL LOW (ref 3.5–5.1)
Sodium: 141 mmol/L (ref 135–145)

## 2020-07-04 MED ORDER — AMLODIPINE BESYLATE 5 MG PO TABS: 5.0000 mg | ORAL_TABLET | Freq: Every day | ORAL | 0 refills | Status: DC

## 2020-07-04 MED ORDER — POTASSIUM CHLORIDE 10 MEQ/100ML IV SOLN
10.0000 meq | INTRAVENOUS | Status: AC
Start: 2020-07-04 — End: 2020-07-04
  Administered 2020-07-04 (×4): 10 meq via INTRAVENOUS
  Filled 2020-07-04 (×4): qty 100

## 2020-07-04 MED ORDER — POTASSIUM CHLORIDE CRYS ER 20 MEQ PO TBCR
20.0000 meq | EXTENDED_RELEASE_TABLET | ORAL | Status: AC
  Administered 2020-07-04 (×2): 20 meq via ORAL
  Filled 2020-07-04 (×2): qty 1

## 2020-07-04 NOTE — Progress Notes (Signed)
Shelley Black to be D/C'd Home per MD order.  Discussed with the patient and all questions fully answered.  VSS, Skin clean, dry and intact without evidence of skin break down, no evidence of skin tears noted. IV catheter discontinued intact. Site without signs and symptoms of complications. Dressing and pressure applied.  An After Visit Summary was printed and given to the patient. Patient received prescription.  D/c education completed with patient/family including follow up instructions, medication list, d/c activities limitations if indicated, with other d/c instructions as indicated by MD - patient able to verbalize understanding, all questions fully answered.   Patient instructed to return to ED, call 911, or call MD for any changes in condition.   Patient escorted via [place], and D/C home via private auto.   [place] 07/04/2020 2:19 PM

## 2020-07-04 NOTE — TOC Transition Note (Signed)
Transition of Care [facility]) - CM/SW Discharge Note   Patient Details  Name: Shelley Black MRN: [id number] Date of Birth: Sep 05, 1930  [facility] CM/SW Contact:  Sharin Mons, RN Phone Number: 07/04/2020, 1:44 PM   Clinical Narrative:    Patient will DC to: home Anticipated DC date:07/04/2020 Family notified: yes Transport by: car  Admitted 3/2 with left basal ganglia hemorrhage and fall. PMhx: HTN, pacemaker, heart block, arthritis, TKA. From home with daughter. PTA independent with ADL's. Already has DME : rolling walker.  Per MD patient ready for DC today. RN, patient, and patient's family notified of DC. Discharge. Home health PT order noted. Pt agreeable to home health services. Choice offered. Pt without preference. Referral made with [facility] and accepted, start of care within 48 hrs of d/c.  DME: 3in 1/BSC will be delivered to bedside prior to d/c.  Pt without Rx med concerns/ affordability.  Post hospital f/u appointments noted on AVS.  RNCM will sign off for now as intervention is no longer needed. Please consult [place] again if new needs arise.      Barriers to Discharge: No Barriers Identified   Patient Goals and CMS Choice Patient states their goals for this hospitalization and ongoing recovery are:: to go home and get better   Choice offered to / list presented to : Patient  Discharge Placement                       Discharge Plan and Services                  DME Agency: [employer] Date DME Agency Contacted: 07/04/20 Time DME Agency Contacted: [id number] Representative spoke with at DME Agency: Freda Munro HH Arranged: PT [place]: [place] ([place]) Date [place]: 07/04/20 Time [place]: [id number] Representative spoke with at [place]: [place] ([place]) Interventions     Readmission Risk Interventions No flowsheet data found.

## 2020-07-04 NOTE — Progress Notes (Signed)
Patient hospitalized with hemorrhagic stroke. Actually doing pretty well. I visited her in ICU today and her daughters were at bedside. She tells me she is going home today.  Will follow up with me in next few days and she knows how to schedule appt and what to do if there is not an appointment available as she has my personal cell phone number.

## 2020-07-04 NOTE — Progress Notes (Signed)
Physical Therapy Treatment Patient Details Name: Shelley Black MRN: [id number] DOB: [birth year] Today's Date: 07/04/2020    History of Present Illness 87 yo admitted 3/2 with left basal ganglia hemorrhage and fall without hitting head on 3/1. PMhx: HTN, pacemaker, heart block, arthritis, TKA    PT Comments    Pt very pleasant with daughters in room throughout session. Pt with improved balance and mobility this session with continued deficits in memory, attention and problem solving. Daughters made aware of deficits and need for supervision with medication and financial management. Pt with slight gait deviation with limited dorsiflexion Left foot which daughter reports as baseline. Will continue to follow and encouraged OOB daily.  BP 144/117    Follow Up Recommendations  Home health PT;Supervision/Assistance - 24 hour     Equipment Recommendations  None recommended by PT    Recommendations for Other Services       Precautions / Restrictions Precautions Precautions: Fall Precaution Comments: sBP <160    Mobility  Bed Mobility Overal bed mobility: Modified Independent                  Transfers Overall transfer level: Modified independent                  Ambulation/Gait Ambulation/Gait assistance: Supervision Gait Distance (Feet): 200 Feet Assistive device: None Gait Pattern/deviations: Step-through pattern;Decreased stride length   Gait velocity interpretation: 1.31 - 2.62 ft/sec, indicative of limited community ambulator General Gait Details: improved gait with no overt LOB with continued need for supervision for way finding and memory   Stairs             Wheelchair Mobility    Modified Rankin (Stroke Patients Only) Modified Rankin (Stroke Patients Only) Pre-Morbid Rankin Score: No symptoms Modified Rankin: Moderately severe disability     Balance Overall balance assessment: Needs assistance   Sitting balance-Leahy Scale: Good        Standing balance-Leahy Scale: Good Standing balance comment: able to static stand x 2 min at teeth and walk without physical assist                            Cognition Arousal/Alertness: Awake/alert Behavior During Therapy: WFL for tasks assessed/performed Overall Cognitive Status: Impaired/Different from baseline Area of Impairment: Memory;Attention;Safety/judgement;Awareness;Problem solving                   Current Attention Level: Sustained Memory: Decreased short-term memory   Safety/Judgement: Decreased awareness of safety;Decreased awareness of deficits Awareness: Emergent Problem Solving: Slow processing General Comments: pt unable to spell world backward, counting back from 50 by 3 pt was able to get to 38 with one error then got stuck, required repetition x 4 to recall room number and had to significantly use signage to find way to room on circular unit      Exercises      General Comments        Pertinent Vitals/Pain Pain Assessment: No/denies pain    Home Living                      Prior Function            PT Goals (current goals can now be found in the care plan section) Progress towards PT goals: Progressing toward goals    Frequency    Min 4X/week      PT Plan Current plan remains appropriate  Co-evaluation              AM-PAC PT "6 Clicks" Mobility   Outcome Measure  Help needed turning from your back to your side while in a flat bed without using bedrails?: None Help needed moving from lying on your back to sitting on the side of a flat bed without using bedrails?: None Help needed moving to and from a bed to a chair (including a wheelchair)?: A Little Help needed standing up from a chair using your arms (e.g., wheelchair or bedside chair)?: A Little Help needed to walk in hospital room?: A Little Help needed climbing 3-5 steps with a railing? : A Little 6 Click Score: 20    End of Session  Equipment Utilized During Treatment: Gait belt Activity Tolerance: Patient tolerated treatment well Patient left: in chair;with call bell/phone within reach;with chair alarm set;with family/visitor present Nurse Communication: Mobility status PT Visit Diagnosis: Other abnormalities of gait and mobility (R26.89)     Time: 1246-1310 PT Time Calculation (min) (ACUTE ONLY): 24 min  Charges:  $Gait Training: 8-22 mins $Therapeutic Activity: 8-22 mins                     Isley Zinni P, PT Acute Rehabilitation Services Pager: [phone] Office: [place] 07/04/2020, 1:48 PM

## 2020-07-04 NOTE — Progress Notes (Signed)
[facility] ADULT ICU REPLACEMENT PROTOCOL   The patient does apply for the [facility] Adult ICU Electrolyte Replacment Protocol based on the criteria listed below:   1. Is GFR >/= 30 ml/min? Yes.    Patient's GFR today is 0>60 2. Is SCr </= 2? Yes.   Patient's SCr is 0.96 ml/kg/hr 3. Did SCr increase >/= 0.5 in 24 hours? No. 4. Abnormal electrolyte(s): K 3.3 5. Ordered repletion with: protocol 6. If a panic level lab has been reported, has the CCM MD in charge been notified? No..   Physician:    Ronda Fairly A 07/04/2020 6:00 AM

## 2020-07-04 NOTE — Discharge Summary (Addendum)
Stroke Discharge Summary  Patient ID: Shelley Black   MRN: [id number]      DOB: 1931/02/05  Date of Admission: 07/02/2020 Date of Discharge: 07/04/2020  Attending Physician:  Garvin Fila, MD, Stroke MD Consultant(s):    None  Patient's PCP:  Dickie La, MD  DISCHARGE DIAGNOSIS: Left basal ganglia intraparenchymal hemorrhage likely due to hypertension Active Problems:   ICH (intracerebral hemorrhage) ([place]) Mild cytotoxic edema   Allergies as of 07/04/2020   No Known Allergies      Medication List     STOP taking these medications    aspirin 81 MG tablet       TAKE these medications    amLODipine 5 MG tablet Commonly known as: NORVASC Take 1 tablet (5 mg total) by mouth daily. Start taking on: July 05, 2020 What changed: additional instructions   FISH OIL TRIPLE STRENGTH PO Take 1 capsule by mouth daily.   multivitamin with minerals Tabs tablet Take 1 tablet by mouth daily.   pravastatin 40 MG tablet Commonly known as: PRAVACHOL Take 1 tablet (40 mg total) by mouth daily.   QUEtiapine 25 MG tablet Commonly known as: SEROQUEL Take 25 mg by mouth at bedtime.   traMADol 50 MG tablet Commonly known as: ULTRAM TAKE 1 TO 2 TABLETS BY MOUTH ONCE DAILY AS NEEDED EVERY  8  HOURS  -MAX  OF  6  TABLETS  IN  A  DAY What changed:  how much to take how to take this when to take this reasons to take this additional instructions   Vitamin D3 50 MCG (2000 UT) Tabs Take 2,000 Units by mouth daily.        LABORATORY STUDIES CBC    Component Value Date/Time   WBC 9.4 07/04/2020 0226   RBC 3.67 (L) 07/04/2020 0226   HGB 11.5 (L) 07/04/2020 0226   HGB 13.1 09/29/2016 1110   HCT 34.2 (L) 07/04/2020 0226   HCT 39.5 09/29/2016 1110   PLT 297 07/04/2020 0226   PLT 329 09/29/2016 1110   MCV 93.2 07/04/2020 0226   MCV 92 09/29/2016 1110   MCH 31.3 07/04/2020 0226   MCHC 33.6 07/04/2020 0226   RDW 13.7 07/04/2020 0226   RDW 13.9 09/29/2016 1110    LYMPHSABS 3.3 11/24/2017 1555   MONOABS 1.0 11/24/2017 1555   EOSABS 0.5 11/24/2017 1555   BASOSABS 0.1 11/24/2017 1555   CMP    Component Value Date/Time   NA 141 07/04/2020 0226   NA 142 01/23/2020 1154   K 3.3 (L) 07/04/2020 0226   CL 108 07/04/2020 0226   CO2 22 07/04/2020 0226   GLUCOSE 96 07/04/2020 0226   BUN 30 (H) 07/04/2020 0226   BUN 33 (H) 01/23/2020 1154   CREATININE 0.96 07/04/2020 0226   CREATININE 1.19 (H) 12/24/2015 1045   CALCIUM 8.4 (L) 07/04/2020 0226   PROT 6.7 07/02/2020 1907   PROT 6.8 01/23/2020 1154   ALBUMIN 3.5 07/02/2020 1907   ALBUMIN 3.9 01/23/2020 1154   AST 23 07/02/2020 1907   ALT 18 07/02/2020 1907   ALKPHOS 49 07/02/2020 1907   BILITOT 0.8 07/02/2020 1907   BILITOT <0.2 01/23/2020 1154   GFRNONAA 57 (L) 07/04/2020 0226   GFRNONAA 42 (L) 12/24/2015 1045   GFRAA 60 01/23/2020 1154   GFRAA 49 (L) 12/24/2015 1045   COAGS Lab Results  Component Value Date   INR 1.0 07/02/2020   INR 1.0 07/02/2020   INR 1.00  11/24/2017   Lipid Panel    Component Value Date/Time   CHOL 204 (H) 07/02/2020 1907   TRIG 115 07/02/2020 1907   HDL 65 07/02/2020 1907   CHOLHDL 3.1 07/02/2020 1907   VLDL 23 07/02/2020 1907   LDLCALC 116 (H) 07/02/2020 1907   HgbA1C  Lab Results  Component Value Date   HGBA1C 5.8 (H) 07/02/2020   Urinalysis    Component Value Date/Time   COLORURINE AMBER (A) 07/03/2020 0205   APPEARANCEUR HAZY (A) 07/03/2020 0205   LABSPEC 1.024 07/03/2020 0205   PHURINE 5.0 07/03/2020 0205   GLUCOSEU NEGATIVE 07/03/2020 0205   HGBUR NEGATIVE 07/03/2020 0205   BILIRUBINUR NEGATIVE 07/03/2020 0205   KETONESUR 5 (A) 07/03/2020 0205   PROTEINUR 30 (A) 07/03/2020 0205   UROBILINOGEN 0.2 12/20/2011 0949   NITRITE NEGATIVE 07/03/2020 0205   LEUKOCYTESUR NEGATIVE 07/03/2020 0205   Urine Drug Screen     Component Value Date/Time   LABOPIA NONE DETECTED 07/03/2020 0205   COCAINSCRNUR NONE DETECTED 07/03/2020 0205   LABBENZ NONE  DETECTED 07/03/2020 0205   AMPHETMU NONE DETECTED 07/03/2020 0205   THCU NONE DETECTED 07/03/2020 0205   LABBARB NONE DETECTED 07/03/2020 0205    Alcohol Level No results found for: [facility]   SIGNIFICANT DIAGNOSTIC STUDIES CT ANGIO HEAD W OR WO CONTRAST  Result Date: 07/03/2020 CLINICAL DATA:  Stroke/TIA, assess intracranial and extracranial arteries. EXAM: CT ANGIOGRAPHY HEAD AND NECK TECHNIQUE: Multidetector CT imaging of the head and neck was performed using the standard protocol during bolus administration of intravenous contrast. Multiplanar CT image reconstructions and MIPs were obtained to evaluate the vascular anatomy. Carotid stenosis measurements (when applicable) are obtained utilizing NASCET criteria, using the distal internal carotid diameter as the denominator. CONTRAST:  68mL OMNIPAQUE IOHEXOL 350 MG/ML SOLN COMPARISON:  Head CT July 02, 2020. FINDINGS: CTA NECK FINDINGS Aortic arch: Standard branching. Imaged portion shows no evidence of aneurysm or dissection. Calcified atherosclerotic changes seen in the visualized portions of the aortic arch and at the origin of the scratch major arch vessel without hemodynamically significant stenosis. She. Right carotid system: Atherosclerotic changes along the right common carotid artery and carotid bifurcation without hemodynamically significant stenosis. Increased tortuosity of the cervical segment of the right ICA. Left carotid system: Atherosclerotic changes along the left common carotid artery and carotid bifurcation without hemodynamically significant stenosis. Increased tortuosity of the cervical segment of the left ICA. Vertebral arteries: Atherosclerotic changes of the left subclavian artery, proximal to the origin of the left vertebral artery with moderate stenosis and soft plaque ulceration. Atherosclerotic changes of the right subclavian artery without hemodynamically significant stenosis. Atherosclerotic plaques are seen at the origin of the  bilateral vertebral arteries with mild stenosis on the left. Increased tortuosity of the V1 segment bilaterally. No hemodynamically significant stenosis of the bilateral vertebral arteries. Skeleton: Degenerative changes of the cervical spine and bilateral temporomandibular joints. Other neck: Multiple hypodense thyroid nodules on the right lobe and isthmus measuring up to 15 with left sided thyroidectomy. Upper chest: Negative Review of the MIP images confirms the above findings CTA HEAD FINDINGS Anterior circulation: No significant stenosis, proximal occlusion, or vascular malformation. A 3 mm outpouching from the undersurface of the supraclinoid right ICA may represent an aneurysm versus an infundibulum at the origin of the right posterior communicating artery. This appears slightly more pronounced than on prior CT angiogram. Posterior circulation: No significant stenosis, proximal occlusion, aneurysm, or vascular malformation. Venous sinuses: As permitted by contrast timing, patent. Anatomic variants:  Proximal basilar artery fenestration. Review of the MIP images confirms the above findings IMPRESSION: 1. Atherosclerotic changes of the bilateral carotid bifurcation without hemodynamically significant. 2. A 3 mm outpouching from the undersurface of the supraclinoid right ICA may represent an aneurysm versus an infundibulum at the origin of the right posterior communicating artery. This appears slightly more pronounced than on prior CT angiogram. 3. Atherosclerotic changes of the left subclavian artery proximal to the origin of the left vertebral artery with moderate stenosis and soft plaque ulceration. 4. Multiple hypodense thyroid nodules on the right lobe and isthmus measuring up to 15 with left sided thyroidectomy. Aortic Atherosclerosis (ICD10-I70.0). Electronically Signed   By: Pedro Earls M.D.   On: 07/03/2020 11:08   CT HEAD WO CONTRAST  Result Date: 07/02/2020 CLINICAL DATA:   Intracranial hemorrhage EXAM: CT HEAD WITHOUT CONTRAST TECHNIQUE: Contiguous axial images were obtained from the base of the skull through the vertex without intravenous contrast. COMPARISON:  07/02/2020 FINDINGS: Brain: Intraparenchymal hematoma within the left basal ganglia is stable measuring 2.5 cm in greatest dimension. Moderate surrounding cytotoxic edema is stable. Small focus of adjacent intraparenchymal hemorrhage within the anterior limb of the left internal capsule is unchanged. Mild mass effect with slight effacement of the sylvian fissure is stable. Mild left-to-right midline shift measuring 4 mm has slightly increased since prior examination Remote infarcts within the right basal ganglia, left occipital lobe, and left cerebellum are again identified. Mild parenchymal volume loss is commensurate with the patient's age. Mild bilateral superimposed periventricular white matter changes are present likely reflecting the sequela of small vessel ischemia. Ventricular size is normal. No interval hemorrhage Vascular: No asymmetric hyperdense vasculature at the skull base. Skull: Intact Sinuses/Orbits: The visualized paranasal sinuses are clear. The visualized orbits are unremarkable. Other: The mastoid air cells and middle ear cavities are clear. IMPRESSION: Stable intraparenchymal hematoma within the left basal ganglia with slight interval increase in mass effect with minimal increase in left-to-right midline shift, now 4 mm. No interval hemorrhage. Electronically Signed   By: Fidela Salisbury MD   On: 07/02/2020 23:11   CT Head Wo Contrast  Result Date: 07/02/2020 CLINICAL DATA:  Fall yesterday, head trauma.  Slurred speech now. EXAM: CT HEAD WITHOUT CONTRAST TECHNIQUE: Contiguous axial images were obtained from the base of the skull through the vertex without intravenous contrast. COMPARISON:  Head CT dated 11/24/2017. FINDINGS: Brain: Acute intraparenchymal hematoma within the LEFT basal ganglia region,  measuring 2.5 cm greatest dimension, with surrounding edema, with associated minimal rightward midline shift. Chronic small vessel ischemic changes within the periventricular white matter regions bilaterally. Old infarct within the medial LEFT occipital lobe with associated encephalomalacia. Vascular: Chronic calcified atherosclerotic changes of the large vessels at the skull base. No unexpected hyperdense vessel. Skull: Normal. Negative for fracture or focal lesion. Sinuses/Orbits: No acute finding. Other: None. IMPRESSION: 1. Acute intraparenchymal hematoma within the LEFT basal ganglia region, measuring 2.5 cm greatest dimension, with surrounding edema, with associated minimal rightward midline shift. Findings most likely represent hypertensive hemorrhage or hemorrhagic infarction, less likely traumatic hemorrhage based on location. 2. Chronic ischemic changes within the white matter and LEFT occipital lobe. 3. No extra-axial hemorrhage.  No skull fracture. Critical Value/emergent results were called by telephone at the time of interpretation on 07/02/2020 at 3:24 pm to provider Dr. Laverta Baltimore, who verbally acknowledged these results. Electronically Signed   By: Franki Cabot M.D.   On: 07/02/2020 15:24   CT ANGIO NECK W OR WO CONTRAST  Result Date: 07/03/2020 CLINICAL DATA:  Stroke/TIA, assess intracranial and extracranial arteries. EXAM: CT ANGIOGRAPHY HEAD AND NECK TECHNIQUE: Multidetector CT imaging of the head and neck was performed using the standard protocol during bolus administration of intravenous contrast. Multiplanar CT image reconstructions and MIPs were obtained to evaluate the vascular anatomy. Carotid stenosis measurements (when applicable) are obtained utilizing NASCET criteria, using the distal internal carotid diameter as the denominator. CONTRAST:  60mL OMNIPAQUE IOHEXOL 350 MG/ML SOLN COMPARISON:  Head CT July 02, 2020. FINDINGS: CTA NECK FINDINGS Aortic arch: Standard branching. Imaged portion  shows no evidence of aneurysm or dissection. Calcified atherosclerotic changes seen in the visualized portions of the aortic arch and at the origin of the scratch major arch vessel without hemodynamically significant stenosis. She. Right carotid system: Atherosclerotic changes along the right common carotid artery and carotid bifurcation without hemodynamically significant stenosis. Increased tortuosity of the cervical segment of the right ICA. Left carotid system: Atherosclerotic changes along the left common carotid artery and carotid bifurcation without hemodynamically significant stenosis. Increased tortuosity of the cervical segment of the left ICA. Vertebral arteries: Atherosclerotic changes of the left subclavian artery, proximal to the origin of the left vertebral artery with moderate stenosis and soft plaque ulceration. Atherosclerotic changes of the right subclavian artery without hemodynamically significant stenosis. Atherosclerotic plaques are seen at the origin of the bilateral vertebral arteries with mild stenosis on the left. Increased tortuosity of the V1 segment bilaterally. No hemodynamically significant stenosis of the bilateral vertebral arteries. Skeleton: Degenerative changes of the cervical spine and bilateral temporomandibular joints. Other neck: Multiple hypodense thyroid nodules on the right lobe and isthmus measuring up to 15 with left sided thyroidectomy. Upper chest: Negative Review of the MIP images confirms the above findings CTA HEAD FINDINGS Anterior circulation: No significant stenosis, proximal occlusion, or vascular malformation. A 3 mm outpouching from the undersurface of the supraclinoid right ICA may represent an aneurysm versus an infundibulum at the origin of the right posterior communicating artery. This appears slightly more pronounced than on prior CT angiogram. Posterior circulation: No significant stenosis, proximal occlusion, aneurysm, or vascular malformation. Venous  sinuses: As permitted by contrast timing, patent. Anatomic variants: Proximal basilar artery fenestration. Review of the MIP images confirms the above findings IMPRESSION: 1. Atherosclerotic changes of the bilateral carotid bifurcation without hemodynamically significant. 2. A 3 mm outpouching from the undersurface of the supraclinoid right ICA may represent an aneurysm versus an infundibulum at the origin of the right posterior communicating artery. This appears slightly more pronounced than on prior CT angiogram. 3. Atherosclerotic changes of the left subclavian artery proximal to the origin of the left vertebral artery with moderate stenosis and soft plaque ulceration. 4. Multiple hypodense thyroid nodules on the right lobe and isthmus measuring up to 15 with left sided thyroidectomy. Aortic Atherosclerosis (ICD10-I70.0). Electronically Signed   By: Pedro Earls M.D.   On: 07/03/2020 11:08   ECHOCARDIOGRAM COMPLETE  Result Date: 07/03/2020    ECHOCARDIOGRAM REPORT   Patient Name:   Shelley Black Date of Exam: 07/03/2020 Medical Rec #:  [id number]         Height:       64.0 in Accession #:    [id number]        Weight:       134.0 lb Date of Birth:  Jan 06, 1931          BSA:          1.650 m Patient Age:    65  years          BP:           136/44 mmHg Patient Gender: F                 HR:           62 bpm. Exam Location:  Inpatient Procedure: 2D Echo, Cardiac Doppler and Color Doppler Indications:    CVA  History:        Patient has prior history of Echocardiogram examinations, most                 recent 11/20/2012. Pacemaker, Carotid Disease; Risk                 Factors:Dyslipidemia and Current Smoker.  Sonographer:    Dustin Flock Referring Phys: [id number] [place]  1. Left ventricular ejection fraction, by estimation, is 50 to 55%. The left ventricle has low normal function. Abnormal (paradoxical) septal motion, consistent with RV pacemaker. Otherwise, no regional wall  motion abnormalities. There is moderate hypertrophy of the basal septum. The rest of the LV segments demonstrate mild hypertrophy. Left ventricular diastolic parameters are consistent with Grade I diastolic dysfunction (impaired relaxation).  2. Right ventricular systolic function is normal. The right ventricular size is normal. There is mildly elevated pulmonary artery systolic pressure. The estimated right ventricular systolic pressure is 69.6 mmHg.  3. Left atrial size was mildly dilated.  4. There is mild thickening of the mitral valve leaflet(s). There is mild calcification of the mitral valve leaflet(s). Moderate mitral annular calcification. Trivial mitral valve regurgitation.  5. The aortic valve is tricuspid. There is mild calcification of the aortic valve. There is mild thickening of the aortic valve. Aortic valve regurgitation is not visualized. Mild to moderate aortic valve sclerosis/calcification is present, without any evidence of aortic stenosis.  6. The inferior vena cava is normal in size with <50% respiratory variability, suggesting right atrial pressure of 8 mmHg. Comparison(s): Compared to prior echo report in 2014, the EF now appears to be 50-55%. Otherwise, no significant change. FINDINGS  Left Ventricle: Left ventricular ejection fraction, by estimation, is 50 to 55%. The left ventricle has low normal function. The left ventricle has no regional wall motion abnormalities. The left ventricular internal cavity size was normal in size. There is moderate hypertrophy of the basal septum. The rest of the LV segments demonstrate mild hypertrophy. Abnormal (paradoxical) septal motion, consistent with RV pacemaker. Left ventricular diastolic parameters are consistent with Grade I diastolic dysfunction (impaired relaxation). Right Ventricle: The right ventricular size is normal. No increase in right ventricular wall thickness. Right ventricular systolic function is normal. There is mildly elevated  pulmonary artery systolic pressure. The tricuspid regurgitant velocity is 2.89  m/s, and with an assumed right atrial pressure of 3 mmHg, the estimated right ventricular systolic pressure is 78.9 mmHg. Left Atrium: Left atrial size was mildly dilated. Right Atrium: Right atrial size was normal in size. Pericardium: There is no evidence of pericardial effusion. Mitral Valve: The mitral valve is abnormal. There is mild thickening of the mitral valve leaflet(s). There is mild calcification of the mitral valve leaflet(s). Moderate mitral annular calcification. Trivial mitral valve regurgitation. Tricuspid Valve: The tricuspid valve is normal in structure. Tricuspid valve regurgitation is mild. Aortic Valve: The aortic valve is tricuspid. There is mild calcification of the aortic valve. There is mild thickening of the aortic valve. Aortic valve regurgitation is not visualized. Mild to moderate aortic valve sclerosis/calcification  is present, without any evidence of aortic stenosis. Pulmonic Valve: The pulmonic valve was normal in structure. Pulmonic valve regurgitation is not visualized. Aorta: The aortic root is normal in size and structure. Venous: The inferior vena cava is normal in size with less than 50% respiratory variability, suggesting right atrial pressure of 8 mmHg. IAS/Shunts: No atrial level shunt detected by color flow Doppler. Additional Comments: A device lead is visualized.  LEFT VENTRICLE PLAX 2D LVIDd:         4.30 cm  Diastology LVIDs:         2.90 cm  LV e' medial:    4.46 cm/s LV PW:         1.30 cm  LV E/e' medial:  14.6 LV IVS:        1.40 cm  LV e' lateral:   3.81 cm/s LVOT diam:     2.00 cm  LV E/e' lateral: 17.1 LV SV:         86 LV SV Index:   52 LVOT Area:     3.14 cm  RIGHT VENTRICLE RV Basal diam:  3.40 cm RV S prime:     14.60 cm/s TAPSE (M-mode): 3.1 cm LEFT ATRIUM             Index       RIGHT ATRIUM           Index LA diam:        2.90 cm 1.76 cm/m  RA Area:     13.60 cm LA Vol  (A2C):   54.8 ml 33.21 ml/m RA Volume:   34.60 ml  20.97 ml/m LA Vol (A4C):   65.2 ml 39.51 ml/m LA Biplane Vol: 65.5 ml 39.69 ml/m  AORTIC VALVE LVOT Vmax:   103.00 cm/s LVOT Vmean:  76.600 cm/s LVOT VTI:    0.274 m  AORTA Ao Root diam: 2.40 cm MITRAL VALVE                TRICUSPID VALVE MV Area (PHT): 3.85 cm     TR Peak grad:   33.4 mmHg MV Decel Time: 197 msec     TR Vmax:        289.00 cm/s MV E velocity: 65.00 cm/s MV A velocity: 108.00 cm/s  SHUNTS MV E/A ratio:  0.60         Systemic VTI:  0.27 m                             Systemic Diam: 2.00 cm Gwyndolyn Kaufman MD Electronically signed by Gwyndolyn Kaufman MD Signature Date/Time: 07/03/2020/12:06:50 PM    Final       HISTORY OF PRESENT ILLNESS She reports that she was getting ready to go to a Podiatry appointment on 3/1 and she fell. She reports that she did not hit her head and that she did not lose consciousness. She reports that her daughter found her down but she did not want to go to the hospital. She states that the next day her daughter thought she was acting confused and slower to respond to questions. She was then brought into the hospital and found to have a intraparenchymal hematoma within the left basal ganglia. Neuro exam was grossly fine with some mild Right sided facial drop and longstanding Right Visual Field deficit from previous stroke in PCA.  HOSPITAL COURSE She presented to [facility] on 3/2 due to responding slower and slightly confused to  her daughter. Her CT head showed stable ~2.5 cm greatest dimension intraparenchymal hematoma within the left basal ganglia with slight interval increase in mass effect with minimal increase in left-to-right midline shift ~40mm compared to earlier scan. Her Neuro Exam was normal except for mild Right lower face droop and continued Right Visual Field Defect that was present from a previous Stroke. She was admitted to the ICU for observation and blood pressure management. She participated well with  PT/OT and their only recommendation was Home Health PT.    On day of discharge her Neuro Exam remains unchanged from admission. Her blood pressure was well controlled. She reported so had not been taking her Amlodipine and so was instructed to resume taking this. Due to her Stroke not being caused by an emboli instructed her to stop taking her 81 mg Aspirin at least until her follow up Neurology Appointment. Instructed her to see her PCP 2 weeks from now. She had no further concerns. She was discharged home with her daughter.   RN Pressure Injury Documentation:     DISCHARGE EXAM Blood pressure (!) 143/61, pulse 68, temperature 97.9 F (36.6 C), temperature source Oral, resp. rate 14, height 5\' 4"  (1.626 m), weight 60.8 kg, SpO2 96 %.  General: Alert and oriented, elderly caucasian female, no apparent distress   Lungs: Symmetrical chest rise, no labored breathing   Cardio: Normal Rate and Rhythm   Abdomen: Soft, non-tender  General: NAD Mental Status: Alert, oriented, thought content appropriate.  Speech fluent without evidence of aphasia.  Able to follow all commands without difficulty. Cranial Nerves: II:  Visual fields grossly normal, pupils equal, round, reactive to light and accommodation III,IV, VI: ptosis not present, extra-ocular motions intact bilaterally V,VII: mild droop on Right side of face, facial light touch sensation normal bilaterally VIII: hearing normal bilaterally IX,X: uvula rises symmetrically XI: bilateral shoulder shrug XII: midline tongue extension without atrophy or fasciculations  Motor: Strength Upper and Lower Extremities 5/5 bilaterally Tone and bulk:normal tone throughout; no atrophy noted Sensory: light touch intact throughout, bilaterally Cerebellar: normal finger-to-nose Gait: deferred     Discharge Diet       Diet   Diet Heart Room service appropriate? Yes; Fluid consistency: Thin   liquids  DISCHARGE PLAN Disposition:  Home with Home  Health PT No antithrombotic for secondary stroke prevention until outpatient follow up Neurology appointment Ongoing stroke risk factor control by Primary Care Physician at time of discharge Follow-up PCP Dickie La, MD in 2 weeks. Follow-up in [facility] in 4 weeks, office to schedule an appointment.   32 minutes were spent preparing discharge. I have personally obtained history,examined this patient, reviewed notes, independently viewed imaging studies, participated in medical decision making and plan of care.ROS completed by me personally and pertinent positives fully documented  I have made any additions or clarifications directly to the above note. Agree with note above.    Antony Contras, MD Medical Director [place] Pager: [phone] 07/04/2020 2:03 PM

## 2020-07-07 ENCOUNTER — Telehealth: Payer: Self-pay | Admitting: *Deleted

## 2020-07-07 NOTE — Telephone Encounter (Signed)
Transition Care Management Unsuccessful Follow-up Telephone Call  Date of discharge and from where:  07/04/2020 - [facility]  Attempts:  1st Attempt  Reason for unsuccessful TCM follow-up call:  No answer/busy

## 2020-07-08 ENCOUNTER — Telehealth: Payer: Self-pay

## 2020-07-08 NOTE — Telephone Encounter (Signed)
Transition Care Management Follow-up Telephone Call  Date of discharge and from where: 07/04/2020 - [facility]  How have you been since you were released from the hospital? "I am getting there"  Any questions or concerns? No  Items Reviewed:  Did the pt receive and understand the discharge instructions provided? Yes   Medications obtained and verified? Yes   Other? No   Any new allergies since your discharge? No   Dietary orders reviewed? Yes  Do you have support at home? Yes   Home Care and Equipment/Supplies: Were home health services ordered? yes Were any new equipment or medical supplies ordered?  No What is the name of the medical supply agency? N/A Were you able to get the supplies/equipment? not applicable Do you have any questions related to the use of the equipment or supplies? No  Functional Questionnaire: (I = Independent and D = Dependent) ADLs: I  Bathing/Dressing- I  Meal Prep- I  Eating- I  Maintaining continence- I  Transferring/Ambulation- I  Managing Meds- I  Follow up appointments reviewed:   PCP Hospital f/u appt confirmed? Yes  Scheduled to see PCP on 07/17/2020 @ 0910.  [facility] f/u appt confirmed? No    Are transportation arrangements needed? No   If their condition worsens, is the pt aware to call PCP or go to the Emergency Dept.? Yes  Was the patient provided with contact information for the PCP's office or ED? Yes  Was to pt encouraged to call back with questions or concerns? Yes

## 2020-07-08 NOTE — Telephone Encounter (Signed)
[facility] from [facility] calling for PT verbal orders as follows:  1 time(s) weekly for 3 week(s), then 2 time(s) weekly for 1 week(s)  Verbal orders given per [facility] protocol  Talbot Grumbling, RN

## 2020-07-09 ENCOUNTER — Telehealth: Payer: Self-pay

## 2020-07-09 NOTE — Telephone Encounter (Signed)
Claiborne Billings, PT from [place] calls nurse line to report patient taking medication that is not on medication list. Patient is taking PreserVision- vitamin that supports eye health. [facility] has new policy that they have to inform PCP regarding any medications not on med list.   Just an FYI.   Talbot Grumbling, RN

## 2020-07-10 ENCOUNTER — Other Ambulatory Visit: Payer: Self-pay | Admitting: *Deleted

## 2020-07-10 NOTE — Patient Outreach (Signed)
[facility]) Care Management  07/10/2020  Shelley Black [facility] 1930/08/02 [id number]   [facility] outreach for EMMI stroke  RED ON EMMI ALERT Day #      1     Date: Sunday 07/06/20 1000  Red Alert Reason:Scheduled a follow-up appointment?No Problems setting up rehab?Yes   Insurance: [employer]  [facility] admissions x 1 ED visits x 1 in the last 6 months    Outreach attempt #1 Patient is able to verify HIPAA identifiers [place] Management RN reviewed and addressed red alert with patient  Consent: [facility] RN CM reviewed [facility] services with patient. Patient gave verbal consent for services [facility] telephonic RN CM.   Advised patient that there will be further automated EMMI- post discharge calls to assess how the patient is doing following the recent hospitalization Advised the patient that another call may be received from a nurse if any of their responses were abnormal. Patient voiced understanding and was appreciative of f/u call.    EMMI:  Pt denies issues with making follow up appointments and setting up rehab  She has home health PT  Her primary care provider (PCP) follow up appointment was scheduled after the Sunday EMMI outreach     Plan: not on APL- [employer]  Patient requests no follow up at this time [facility] RN CM will close case  Shelley Millin L. Lavina Hamman, RN, BSN, [place] Coordinator Office number [phone] Mobile number [phone]  Main [facility] number [phone] Fax number [phone]

## 2020-07-11 ENCOUNTER — Encounter: Payer: Self-pay | Admitting: *Deleted

## 2020-07-16 NOTE — Progress Notes (Signed)
    SUBJECTIVE:   CHIEF COMPLAINT / HPI: hospital f/u for stroke   Hospital f/u  She presented to [facility] on 3/2 due concern that she was responding slowly and seemed confusion. She was evaluted by neurology and noted to have stable right visual field defect and right sided facial droop from prior stroke. Shelley Black was admitted to the ICU due to expanding changes noted on her head CT in the left basal ganglia as compared to prior imaging. After blood pressure monitoring and PT sessions, she was discharged with instructions to restart home amlodipine, work with [facility] PT and discontiue ASA until neurology follow up.   Today patient presents for follow up and reports she is feeling much better.  She denies any weakness or vision changes or headache at this time.  Patient states that her blood pressure has been elevated to the 170s during the visit with her physical therapist but was normal upon recheck a few moments later at 130. Patient reports problems with dizziness when her blood pressure medication was changed in the past.  She has not taken her amlodipine today stating that she takes it later in the morning usually. Patient reports that she has been able to do her exercises with her PT and feels sessions are going well.    PERTINENT  PMH / PSH:  Hypertension CAD Intracranial hemorrhage  OBJECTIVE:   BP (!) 146/49   Pulse (!) 54   Ht 5\' 4"  (1.626 m)   Wt 138 lb 6.4 oz (62.8 kg)   SpO2 98%   BMI 23.76 kg/m   General: Female appearing stated age in no acute distress HEENT: MMM, no oral lesions noted Cardio: Rhythm is regular. No murmurs or rubs.  Bilateral radial pulses palpable Pulm: Normal respiratory effort  Abdomen: Bowel sounds normal. Abdomen soft and non-tender.  Extremities: No peripheral edema. Warm & well perfused.  Neuro: pt alert and oriented x4, follows commands, PERRLA, EOMI bilaterally   ASSESSMENT/PLAN:   ICH (intracerebral hemorrhage) ([place]) Patient doing  well at home after discharge from hospital. Daughter reports she has returned to baseline.  Referral to neurology for follow up after hospitalization  Patient to continue BP monitoring and amlodipine at home  BP goal <161 systolic  F/u in 4-6 weeks      Eulis Foster, MD [place]

## 2020-07-17 ENCOUNTER — Encounter: Payer: Self-pay | Admitting: Family Medicine

## 2020-07-17 ENCOUNTER — Other Ambulatory Visit: Payer: Self-pay

## 2020-07-17 ENCOUNTER — Ambulatory Visit (INDEPENDENT_AMBULATORY_CARE_PROVIDER_SITE_OTHER): Payer: Medicare (Managed Care) | Admitting: Family Medicine

## 2020-07-17 VITALS — BP 146/49 | HR 54 | Ht 64.0 in | Wt 138.4 lb

## 2020-07-17 DIAGNOSIS — I610 Nontraumatic intracerebral hemorrhage in hemisphere, subcortical: Secondary | ICD-10-CM

## 2020-07-17 NOTE — Assessment & Plan Note (Signed)
Patient doing well at home after discharge from hospital. Daughter reports she has returned to baseline.  Referral to neurology for follow up after hospitalization  Patient to continue BP monitoring and amlodipine at home  BP goal <558 systolic  F/u in 4-6 weeks

## 2020-07-17 NOTE — Patient Instructions (Signed)
It was a pleasure to see you today!  Thank you for choosing [facility] for your primary care.   Shelley Black was seen for follow up for recent hospitalization.   Our plans for today were:  Continue to take your blood pressure medication, amlodipine daily.   Your blood pressure is elevated today. I recommend checking your blood pressure 1-2 times daily for the next few weeks and monitoring for any headache or blurry vision as you may need to be seen urgently.   Monitor for any signs of confusion, slurred speech or increased weakness.     You should return to our clinic in 4-6 weeks for blood pressure follow up.   Best Wishes,   Dr. Alba Cory    How to Take Your Blood Pressure Blood pressure measures how strongly your blood is pressing against the walls of your arteries. Arteries are blood vessels that carry blood from your heart throughout your body. You can take your blood pressure at home with a machine. You may need to check your blood pressure at home:  To check if you have high blood pressure (hypertension).  To check your blood pressure over time.  To make sure your blood pressure medicine is working. Supplies needed:  Blood pressure machine, or monitor.  Dining room chair to sit in.  Table or desk.  Small notebook.  Pencil or pen. How to prepare Avoid these things for 30 minutes before checking your blood pressure:  Having drinks with caffeine in them, such as coffee or tea.  Drinking alcohol.  Eating.  Smoking.  Exercising. Do these things five minutes before checking your blood pressure:  Go to the bathroom and pee (urinate).  Sit in a dining chair. Do not sit in a soft couch or an armchair.  Be quiet. Do not talk. How to take your blood pressure Follow the instructions that came with your machine. If you have a digital blood pressure monitor, these may be the instructions: 1. Sit up straight. 2. Place your feet on  the floor. Do not cross your ankles or legs. 3. Rest your left arm at the level of your heart. You may rest it on a table, desk, or chair. 4. Pull up your shirt sleeve. 5. Wrap the blood pressure cuff around the upper part of your left arm. The cuff should be 1 inch (2.5 cm) above your elbow. It is best to wrap the cuff around bare skin. 6. Fit the cuff snugly around your arm. You should be able to place only one finger between the cuff and your arm. 7. Place the cord so that it rests in the bend of your elbow. 8. Press the power button. 9. Sit quietly while the cuff fills with air and loses air. 10. Write down the numbers on the screen. 11. Wait 2-3 minutes and then repeat steps 1-10.   What do the numbers mean? Two numbers make up your blood pressure. The first number is called systolic pressure. The second is called diastolic pressure. An example of a blood pressure reading is "120 over 80" (or 120/80). If you are an adult and do not have a medical condition, use this guide to find out if your blood pressure is normal: Normal  First number: below 120.  Second number: below 80. Elevated  First number: 120-129.  Second number: below 80. Hypertension stage 1  First number: 130-139.  Second number: 80-89. Hypertension stage 2  First number: 140 or above.  Second  number: 90 or above. Your blood pressure is above normal even if only the top or bottom number is above normal. Follow these instructions at home:  Check your blood pressure as often as your doctor tells you to.  Check your blood pressure at the same time every day.  Take your monitor to your next doctor's appointment. Your doctor will: ? Make sure you are using it correctly. ? Make sure it is working right.  Make sure you understand what your blood pressure numbers should be.  Tell your doctor if your medicine is causing side effects.  Keep all follow-up visits as told by your doctor. This is  important. General tips:  You will need a blood pressure machine, or monitor. Your doctor can suggest a monitor. You can buy one at a drugstore or online. When choosing one: ? Choose one with an arm cuff. ? Choose one that wraps around your upper arm. Only one finger should fit between your arm and the cuff. ? Do not choose one that measures your blood pressure from your wrist or finger. Where to find more information American Heart Association: www.heart.org Contact a doctor if:  Your blood pressure keeps being high. Get help right away if:  Your first blood pressure number is higher than 180.  Your second blood pressure number is higher than 120. Summary  Check your blood pressure at the same time every day.  Avoid caffeine, alcohol, smoking, and exercise for 30 minutes before checking your blood pressure.  Make sure you understand what your blood pressure numbers should be. This information is not intended to replace advice given to you by your health care provider. Make sure you discuss any questions you have with your health care provider. Document Revised: 04/13/2019 Document Reviewed: 04/13/2019 Elsevier Patient Education  2021 [employer].

## 2020-07-28 ENCOUNTER — Other Ambulatory Visit: Payer: Self-pay

## 2020-07-28 ENCOUNTER — Encounter: Payer: Self-pay | Admitting: Podiatry

## 2020-07-28 ENCOUNTER — Ambulatory Visit (INDEPENDENT_AMBULATORY_CARE_PROVIDER_SITE_OTHER): Payer: PRIVATE HEALTH INSURANCE | Admitting: Podiatry

## 2020-07-28 DIAGNOSIS — L84 Corns and callosities: Secondary | ICD-10-CM | POA: Diagnosis not present

## 2020-07-28 DIAGNOSIS — B351 Tinea unguium: Secondary | ICD-10-CM

## 2020-07-28 DIAGNOSIS — I739 Peripheral vascular disease, unspecified: Secondary | ICD-10-CM

## 2020-07-28 DIAGNOSIS — M79609 Pain in unspecified limb: Secondary | ICD-10-CM

## 2020-07-28 DIAGNOSIS — M79676 Pain in unspecified toe(s): Secondary | ICD-10-CM

## 2020-07-31 NOTE — Progress Notes (Signed)
  Subjective:  Patient ID: Shelley Black, female    DOB: 1930-11-14,  MRN: [id number]  87 y.o. female presents painful thick toenails that are difficult to trim. Pain interferes with ambulation. Aggravating factors include wearing enclosed shoe gear. Pain is relieved with periodic professional debridement..  Pain interferes with ambulation. Aggravating factors include wearing enclosed shoe gear. Pain is relieved with periodic professional debridement.  No Known Allergies  Review of Systems: Negative except as noted in the HPI.  Objective:   Constitutional Pt is a pleasant 87 y.o. Caucasian female in NAD. AAO x 3.   Vascular Capillary fill time to digits <3 seconds b/l lower extremities. Faintly palpable DP pulse(s) b/l lower extremities. Nonpalpable PT pulse(s) b/l lower extremities. Pedal hair absent. Lower extremity skin temperature gradient within normal limits. No pain with calf compression b/l.  Neurologic Protective sensation decreased with 10 gram monofilament b/l. Proprioception intact bilaterally.  Dermatologic Pedal skin with normal turgor, texture and tone bilaterally. No open wounds bilaterally. No interdigital macerations bilaterally. Toenails 1-5 b/l elongated, discolored, dystrophic, thickened, crumbly with subungual debris and tenderness to dorsal palpation. Hyperkeratotic lesion(s) submet head 2 left foot.  No erythema, no edema, no drainage, no fluctuance.  Orthopedic: Normal muscle strength 5/5 to all lower extremity muscle groups bilaterally. No pain crepitus or joint limitation noted with ROM b/l. No gross bony deformities bilaterally.   Radiographs: None Assessment:   1. Pain due to onychomycosis of nail   2. Callus   3. Peripheral vascular disease ([place])    Plan:  Patient was evaluated and treated and all questions answered.  Onychomycosis with pain -Nails palliatively debridement as below. -Educated on self-care  Procedure: Nail Debridement Rationale:  Pain Type of Debridement: manual, sharp debridement. Instrumentation: Nail nipper, rotary burr. Number of Nails: 10  -Examined patient. -Patient to continue soft, supportive shoe gear daily. -Toenails 1-5 b/l were debrided in length and girth with sterile nail nippers and dremel without iatrogenic bleeding.  -Calluses pared submetatarsal head(s) 2 left foot utilizing sterile scalpel blade without incident. -Patient to report any pedal injuries to medical professional immediately. -Patient/POA to call should there be question/concern in the interim.  Return in about 3 months (around 10/28/2020).  Marzetta Board, DPM

## 2020-08-01 NOTE — Progress Notes (Deleted)
NEUROLOGY FOLLOW UP OFFICE NOTE  Shelley Black [id number]  Assessment/Plan:   1.  Left basal ganglia hemorrhage, likely secondary to hypertension 2.  Hypertension 3.  Hyperlipidemia 4.  History of left occipital stroke  1.  ***   Subjective:  Shelley Black is an 87 year old right-handed female with complete heart block s/p pacemaker Medtronic, hypertension and arthritis whom I previously saw for headache and visual disturbance presents today for intracranial bleed.  She is accompanied by her husband and daughter who supplements history.  History supplemented by hospital notes.  UPDATE: Last seen in July 2019 but lost to follow up.  At that time, she developed thunderclap headache on top of her head and later had visual disturbance (gray central vision loss in both eyes).    She saw her ophthalmologist on 11/24/17 who sent her to the ED for suspected stroke.  CT of head without contrast demonstrated mild to moderate chronic small vessel ischemic changes but no acute findings.  Unable to get MRI due to pacemaker.  CTA of head and neck personally reviewed and demonstrated no large vessel occlusion or high-grade stenosis.  Although focal severe narrowing was seen in the left vertebral artery at C5 level, possibly due to mass effect from adjacent facet and uncovertebral hypertrophy.  Migraine aura was suspected at that time but she continued to have right visual field cut so stroke subsequently suspected.  Chronic left occipital stroke confirmed on a later CT head  On 07/01/2020, she fell but did not strike her head.  The next day she was acting confused and slow to respond.  She was brought to the hospital where she was noted to have mild right facial droop and right visual field cut (residual from 2019).  CT head personally reviewed demonstrated 2.5 cm hemorrhage within the left basal ganglia with 4 mm left-to-right midline shift as well as remote left occipital infarct.  Admitted to  ICU for observation.  Repeat CT showed stable hemorrhage with slight interval increase mass effect but now minimal ***.  ASA was discontinued.  ***  Current medications:  Amlodipine 5mg  QD, pravastatin 40mg , quetiapine 25mg  QHS, tramadol, fish oil, MVI, D3  PAST MEDICAL HISTORY: Past Medical History:  Diagnosis Date  . Arthritis   . Cardiac pacemaker Medtronic   . Complete heart block ([place])   . Heart murmur    not seen by cardiologist  . Hypertension     MEDICATIONS: Current Outpatient Medications on File Prior to Visit  Medication Sig Dispense Refill  . amLODipine (NORVASC) 5 MG tablet Take 1 tablet (5 mg total) by mouth daily. 30 tablet 0  . Cholecalciferol (VITAMIN D3) 2000 UNITS TABS Take 2,000 Units by mouth daily.     . Multiple Vitamin (MULTIVITAMIN WITH MINERALS) TABS Take 1 tablet by mouth daily.    . Omega-3 Fatty Acids (FISH OIL TRIPLE STRENGTH PO) Take 1 capsule by mouth daily.    . pravastatin (PRAVACHOL) 40 MG tablet Take 1 tablet (40 mg total) by mouth daily. 90 tablet 3  . QUEtiapine (SEROQUEL) 25 MG tablet Take 25 mg by mouth at bedtime. 90 tablet 3  . traMADol (ULTRAM) 50 MG tablet TAKE 1 TO 2 TABLETS BY MOUTH ONCE DAILY AS NEEDED EVERY  8  HOURS  -MAX  OF  6  TABLETS  IN  A  DAY (Patient taking differently: Take 50 mg by mouth every 6 (six) hours as needed for moderate pain.) 180 tablet 5   No  current facility-administered medications on file prior to visit.    ALLERGIES: No Known Allergies  FAMILY HISTORY: Family History  Problem Relation Age of Onset  . Heart attack Mother   . Heart attack Father   . Other Son        MVA  . Diabetes Neg Hx   . Cancer Neg Hx       Objective:  *** General: No acute distress.  Patient appears ***-groomed.   Head:  Normocephalic/atraumatic Eyes:  Fundi examined but not visualized Neck: supple, no paraspinal tenderness, full range of motion Heart:  Regular rate and rhythm Lungs:  Clear to auscultation  bilaterally Back: No paraspinal tenderness Neurological Exam: alert and oriented to person, place, and time. Attention span and concentration intact, recent and remote memory intact, fund of knowledge intact.  Speech fluent and not dysarthric, language intact.  CN II-XII intact. Bulk and tone normal, muscle strength 5/5 throughout.  Sensation to light touch, temperature and vibration intact.  Deep tendon reflexes 2+ throughout, toes downgoing.  Finger to nose and heel to shin testing intact.  Gait normal, Romberg negative.     Metta Clines, DO  CC: ***

## 2020-08-04 ENCOUNTER — Ambulatory Visit: Payer: Medicare Other | Admitting: Neurology

## 2020-08-05 ENCOUNTER — Ambulatory Visit (INDEPENDENT_AMBULATORY_CARE_PROVIDER_SITE_OTHER): Payer: Medicare Other

## 2020-08-05 ENCOUNTER — Telehealth: Payer: Self-pay | Admitting: *Deleted

## 2020-08-05 DIAGNOSIS — I610 Nontraumatic intracerebral hemorrhage in hemisphere, subcortical: Secondary | ICD-10-CM

## 2020-08-05 DIAGNOSIS — I442 Atrioventricular block, complete: Secondary | ICD-10-CM

## 2020-08-05 NOTE — Telephone Encounter (Signed)
Returned patient's call about neurology being out of network.  Per chart review, patient has 2 insurance cards scanned in and 1 of them is out of state through [place].  Asked daughter to please call me back so we can clear up which one the patient is actually using.  Rudransh Bellanca,CMA

## 2020-08-05 NOTE — Telephone Encounter (Signed)
FYI:   Daughter called back and states that [facility] doesn't accept patient's plan of [employer].  They would like to try [place] to see if they are covered there, as to not get a big bill.  Referral placed.  Francina Beery,CMA

## 2020-08-06 LAB — CUP PACEART REMOTE DEVICE CHECK
Battery Impedance: 1301 Ohm
Battery Remaining Longevity: 47 mo
Battery Voltage: 2.76 V
Brady Statistic AP VP Percent: 31 %
Brady Statistic AP VS Percent: 0 %
Brady Statistic AS VP Percent: 69 %
Brady Statistic AS VS Percent: 0 %
Date Time Interrogation Session: 20220406131428
Implantable Lead Implant Date: 20140331
Implantable Lead Implant Date: 20140331
Implantable Lead Location: 753859
Implantable Lead Location: 753860
Implantable Lead Model: 5076
Implantable Lead Model: 5076
Implantable Pulse Generator Implant Date: 20140331
Lead Channel Impedance Value: 456 Ohm
Lead Channel Impedance Value: 498 Ohm
Lead Channel Pacing Threshold Amplitude: 0.375 V
Lead Channel Pacing Threshold Amplitude: 1.25 V
Lead Channel Pacing Threshold Pulse Width: 0.4 ms
Lead Channel Pacing Threshold Pulse Width: 0.4 ms
Lead Channel Setting Pacing Amplitude: 2 V
Lead Channel Setting Pacing Amplitude: 2.5 V
Lead Channel Setting Pacing Pulse Width: 0.4 ms
Lead Channel Setting Sensing Sensitivity: 2 mV

## 2020-08-13 ENCOUNTER — Other Ambulatory Visit: Payer: Self-pay

## 2020-08-13 MED ORDER — AMLODIPINE BESYLATE 5 MG PO TABS: 5.0000 mg | ORAL_TABLET | Freq: Every day | ORAL | 0 refills | Status: DC

## 2020-08-18 NOTE — Progress Notes (Signed)
Remote pacemaker transmission.   

## 2020-08-22 ENCOUNTER — Other Ambulatory Visit: Payer: Self-pay | Admitting: Internal Medicine

## 2020-08-22 NOTE — Telephone Encounter (Signed)
Pt is requesting a refill on amlodipine. Pt has not been seen since 2019. Pt has an upcoming appt on 10/31/20. Pt has also cancelled appts multiple times. Would Dr. Caryl Comes like to refill this medication until appt in July 2022? Please address

## 2020-08-22 NOTE — Telephone Encounter (Signed)
Pt will need to contact PCP to receive Amlodipine refill as pt has not been seen since  By Dr Caryl Comes since 2019 and has had a recent stroke 07/02/2020.  Has recently seen primary provider.

## 2020-08-22 NOTE — Telephone Encounter (Signed)
*  STAT* If patient is at the pharmacy, call can be transferred to refill team.   1. Which medications need to be refilled? (please list name of each medication and dose if known)  amLODipine (NORVASC) 5 MG tablet  2. Which pharmacy/location (including street and city if local pharmacy) is medication to be sent to? [facility] [place], [place]  3. Do they need a 30 day or 90 day supply?  90 day   PT is all out of this medication.PTs next appt is 10/30/2020.Please advise

## 2020-09-09 ENCOUNTER — Other Ambulatory Visit: Payer: Self-pay | Admitting: Family Medicine

## 2020-09-10 ENCOUNTER — Ambulatory Visit (INDEPENDENT_AMBULATORY_CARE_PROVIDER_SITE_OTHER): Payer: Medicare (Managed Care) | Admitting: Family Medicine

## 2020-09-10 ENCOUNTER — Encounter: Payer: Self-pay | Admitting: Family Medicine

## 2020-09-10 ENCOUNTER — Other Ambulatory Visit: Payer: Self-pay

## 2020-09-10 DIAGNOSIS — I6509 Occlusion and stenosis of unspecified vertebral artery: Secondary | ICD-10-CM | POA: Diagnosis not present

## 2020-09-10 DIAGNOSIS — H532 Diplopia: Secondary | ICD-10-CM | POA: Diagnosis not present

## 2020-09-10 DIAGNOSIS — R42 Dizziness and giddiness: Secondary | ICD-10-CM

## 2020-09-10 DIAGNOSIS — I10 Essential (primary) hypertension: Secondary | ICD-10-CM | POA: Diagnosis not present

## 2020-09-10 DIAGNOSIS — G4700 Insomnia, unspecified: Secondary | ICD-10-CM | POA: Diagnosis not present

## 2020-09-10 DIAGNOSIS — I442 Atrioventricular block, complete: Secondary | ICD-10-CM

## 2020-09-10 DIAGNOSIS — I469 Cardiac arrest, cause unspecified: Secondary | ICD-10-CM

## 2020-09-10 MED ORDER — AMLODIPINE BESYLATE 2.5 MG PO TABS
2.5000 mg | ORAL_TABLET | Freq: Every day | ORAL | 3 refills | Status: DC
Start: 2020-09-10 — End: 2020-10-31

## 2020-09-12 NOTE — Assessment & Plan Note (Signed)
As she is paced, I do not think this is contributing to her dizziness.

## 2020-09-12 NOTE — Assessment & Plan Note (Signed)
Neurology has seen her and evaluated her.  They noted that she had double vision only when both eyes were open.  They have referred her to ophthalmology.  Appreciate their care and consult.  Not sure if the blurry vision is associated with something from the stroke or other, but hopefully ophthalmology can be helpful to [place].

## 2020-09-12 NOTE — Assessment & Plan Note (Signed)
History of cardiac arrest for complete heart block.  Status post pacemaker and she seems to be doing well.  She follows up regularly for interrogation of her pacer.

## 2020-09-12 NOTE — Assessment & Plan Note (Signed)
She seems to be doing well on the decreased dose of The pain.  I would like to totally get rid of that at some point in the future.  We will have to go slowly as she is fairly attached to this medication.

## 2020-09-12 NOTE — Progress Notes (Signed)
    CHIEF COMPLAINT / HPI: #1.  Hypertension: Has questions about whether or not her blood pressure could be contributing to her dizziness. 2.:  Dizziness.  She saw the neurologist and he thinks this may be related to medication or a combination of that with her most recent CVA. 3.  Vision loss: She still has blurred vision particularly in the left eye.  Neurology thinks it is probably permanent at this point.  Unclear if that is contributing to her dizziness. 4.  Sleep disorder: She had been managed by taking ketamine at night.  We decreased her dose of that recently.  She still having good improvement on her sleep. 5.  Hyperlipidemia.  Continues on her pravastatin without problem.   PERTINENT  PMH / PSH: I have reviewed the patient's medications, allergies, past medical and surgical history, smoking status and updated in the EMR as appropriate.   OBJECTIVE:  BP 136/60   Pulse 63   Ht 5\' 4"  (1.626 m)   Wt 133 lb 3.2 oz (60.4 kg)   SpO2 98%   BMI 22.86 kg/m  GENERAL: Well-developed frail-appearing female CV: No murmur.  Regular rate and rhythm. LUNGS: Clear to auscultation bilaterally. Gait: Mildly unsteady.  I think this may be related mostly to her vision issues.  Shortened stride length.  She needs a little assistance to rise from the chair.  Her muscle bulk is decreased. NEURO: Bilateral blurry vision.  ASSESSMENT / PLAN:   Essential hypertension Further decrease in her blood pressure regimen to half dose of her amlodipine.  We had previously discontinued her ACE and her diuretic.  We will follow her up 4 to 6 weeks with some blood pressure readings.  See if this improves her dizziness at all.  Might need to discontinue all of her blood pressure meds.  Given her intracranial stenosis, neurology said goal blood pressure less than 160  Focal stenosis LEFT vertebral artery Continue pravastatin.  She has been followed by neuro as well.  INSOMNIA She seems to be doing well on the  decreased dose of The pain.  I would like to totally get rid of that at some point in the future.  We will have to go slowly as she is fairly attached to this medication.  Double vision Neurology has seen her and evaluated her.  They noted that she had double vision only when both eyes were open.  They have referred her to ophthalmology.  Appreciate their care and consult.  Not sure if the blurry vision is associated with something from the stroke or other, but hopefully ophthalmology can be helpful to [place].  Dizziness and giddiness Still somewhat dizzy.  Partly related to visual issues I think but I suspect there is some contribution from continued low blood pressure.  We have reduced her dosing as above.  Follow-up 4 to 6 weeks, sooner with problems.  Cardiac arrest [facility]) History of cardiac arrest for complete heart block.  Status post pacemaker and she seems to be doing well.  She follows up regularly for interrogation of her pacer.  Complete heart block As she is paced, I do not think this is contributing to her dizziness.   [name] MD

## 2020-09-12 NOTE — Assessment & Plan Note (Signed)
Still somewhat dizzy.  Partly related to visual issues I think but I suspect there is some contribution from continued low blood pressure.  We have reduced her dosing as above.  Follow-up 4 to 6 weeks, sooner with problems.

## 2020-09-12 NOTE — Assessment & Plan Note (Signed)
Continue pravastatin.  She has been followed by neuro as well.

## 2020-09-12 NOTE — Patient Instructions (Signed)
No to expecting their majority of admission to start I think we itching about 3 hospital

## 2020-09-12 NOTE — Assessment & Plan Note (Addendum)
Further decrease in her blood pressure regimen to half dose of her amlodipine.  We had previously discontinued her ACE and her diuretic.  We will follow her up 4 to 6 weeks with some blood pressure readings.  See if this improves her dizziness at all.  Might need to discontinue all of her blood pressure meds.  Given her intracranial stenosis, neurology said goal blood pressure less than 160

## 2020-10-07 ENCOUNTER — Other Ambulatory Visit: Payer: Self-pay | Admitting: Family Medicine

## 2020-10-09 ENCOUNTER — Ambulatory Visit: Payer: Medicare Other | Admitting: Neurology

## 2020-10-30 ENCOUNTER — Ambulatory Visit: Payer: Medicare Other | Admitting: Internal Medicine

## 2020-10-31 ENCOUNTER — Ambulatory Visit (INDEPENDENT_AMBULATORY_CARE_PROVIDER_SITE_OTHER): Payer: Medicare (Managed Care) | Admitting: Internal Medicine

## 2020-10-31 ENCOUNTER — Other Ambulatory Visit: Payer: Self-pay

## 2020-10-31 ENCOUNTER — Encounter: Payer: Self-pay | Admitting: Internal Medicine

## 2020-10-31 VITALS — BP 152/80 | HR 75 | Ht 65.0 in | Wt 131.8 lb

## 2020-10-31 DIAGNOSIS — Z950 Presence of cardiac pacemaker: Secondary | ICD-10-CM

## 2020-10-31 DIAGNOSIS — I442 Atrioventricular block, complete: Secondary | ICD-10-CM

## 2020-10-31 MED ORDER — AMLODIPINE BESYLATE 5 MG PO TABS: 5.0000 mg | ORAL_TABLET | Freq: Every day | ORAL | 3 refills | Status: DC

## 2020-10-31 NOTE — Patient Instructions (Signed)
Medication Instructions:  Your physician has recommended you make the following change in your medication:   ** Stop Amlodipine 2.5mg    ** Begin Amlodipine 5mg  - 1 tablet by mouth daily   *If you need a refill on your cardiac medications before your next appointment, please call your pharmacy*   Lab Work: None ordered.  If you have labs (blood work) drawn today and your tests are completely normal, you will receive your results only by: [place] (if you have MyChart) OR A paper copy in the mail If you have any lab test that is abnormal or we need to change your treatment, we will call you to review the results.   Testing/Procedures: None ordered.    Follow-Up: At [facility], you and your health needs are our priority.  As part of our continuing mission to provide you with exceptional heart care, we have created designated Provider Care Teams.  These Care Teams include your primary Cardiologist (physician) and Advanced Practice Providers (APPs -  Physician Assistants and Nurse Practitioners) who all work together to provide you with the care you need, when you need it.  We recommend signing up for the patient portal called "MyChart".  Sign up information is provided on this After Visit Summary.  MyChart is used to connect with patients for Virtual Visits (Telemedicine).  Patients are able to view lab/test results, encounter notes, upcoming appointments, etc.  Non-urgent messages can be sent to your provider as well.   To learn more about what you can do with MyChart, go to [email or web address].    Your next appointment:   12 month(s)  The format for your next appointment:   In Person  Provider:   Virl Axe, MD

## 2020-10-31 NOTE — Progress Notes (Signed)
Patient ID: Shelley Black, female   DOB: 22-Oct-1930, 87 y.o.   MRN: [id number]      Patient Care Team: Dickie La, MD as PCP - General   HPI  Shelley Black is a 87 y.o. female Seen following Medtronic pacemaker implantation 3/14 for complete heart block that was complicated by pause dependent polymorphic ventricular tachycardia    Intercurrently hospitalized 3/22 for intracranial hemorrhage attributed to hypertension. And she was told to stop her aspirin at that time.  She was maintained on Pravachol and her amlodipine was increased from 2.5--- than 5 mg a day.  She comes in today still on aspirin and the lower dose amlodipine.  Today, the patient denies chest pain, shortness of breath, nocturnal dyspnea, orthopnea or peripheral edema.  There have been no palpitations or syncope.  Complains of lightheadedness particularly with standing.  Sometimes with bending over.    Date   Cr              K         Hgb   09/21 0.98 4.3            3 /22  0.96 3.3     DATE TEST EF   3/22 Echo  50-55 %          Past Medical History:  Diagnosis Date   Arthritis    Cardiac pacemaker Medtronic    Complete heart block (HCC)    Heart murmur    not seen by cardiologist   Hypertension     Past Surgical History:  Procedure Laterality Date   APPENDECTOMY     CATARACT EXTRACTION, BILATERAL     LEFT HEART CATHETERIZATION WITH CORONARY ANGIOGRAM N/A 07/29/2012   Procedure: LEFT HEART CATHETERIZATION WITH CORONARY ANGIOGRAM;  Surgeon: Peter M Martinique, MD;  Location: [facility] CATH LAB;  Service: Cardiovascular;  Laterality: N/A;   PERMANENT PACEMAKER INSERTION N/A 07/31/2012   Procedure: PERMANENT PACEMAKER INSERTION;  Surgeon: Deboraha Sprang, MD;  Location: [facility] CATH LAB;  Service: Cardiovascular;  Laterality: N/A;   ROTATOR CUFF REPAIR Right    TEMPORARY PACEMAKER INSERTION  07/29/2012   Procedure: TEMPORARY PACEMAKER INSERTION;  Surgeon: Peter M Martinique, MD;  Location: [facility] CATH LAB;   Service: Cardiovascular;;   THYROID SURGERY     TOTAL KNEE ARTHROPLASTY Left    TOTAL KNEE ARTHROPLASTY  12/24/2011   Procedure: TOTAL KNEE ARTHROPLASTY;  Surgeon: Alta Corning, MD;  Location: [place];  Service: Orthopedics;  Laterality: Right;  general anesthesia with pre op femoral nerve block    Current Outpatient Medications  Medication Sig Dispense Refill   amLODipine (NORVASC) 2.5 MG tablet Take 1 tablet (2.5 mg total) by mouth at bedtime. 90 tablet 3   aspirin 81 MG EC tablet Take 1 tablet by mouth daily.     Cholecalciferol (VITAMIN D3) 2000 UNITS TABS Take 2,000 Units by mouth daily.      Multiple Vitamin (MULTIVITAMIN WITH MINERALS) TABS Take 1 tablet by mouth daily.     Omega-3 Fatty Acids (FISH OIL TRIPLE STRENGTH PO) Take 1 capsule by mouth daily.     pravastatin (PRAVACHOL) 40 MG tablet Take 1 tablet (40 mg total) by mouth daily. 90 tablet 3   QUEtiapine (SEROQUEL) 25 MG tablet Take 25 mg by mouth at bedtime. 90 tablet 3   traMADol (ULTRAM) 50 MG tablet TAKE 1 TO 2 TABLETS BY MOUTH EVERY 8 HOURS AS NEEDED . DO NOT EXCEED 6 PER 24 HOURS 180  tablet 3   No current facility-administered medications for this visit.    No Known Allergies  Review of Systems negative except from HPI and PMH  Physical Exam: BP (!) 152/80   Pulse 75   Ht 5\' 5"  (1.651 m)   Wt 131 lb 12.8 oz (59.8 kg)   SpO2 95%   BMI 21.93 kg/m  Well developed and nourished in no acute distress HENT normal Neck supple with JVP-  flat   Lungs Clear Regular rate and rhythm, no murmurs or gallops Abd-soft with active BS No Clubbing cyanosis edema Skin-warm and dry A & Oriented  Grossly normal sensory and motor function  ECG: Sinus with P synchronous pacing with frequent PACs   Assessment and  Plan  Complete heart block -intermittent  Polymorphic ventricular tachycardia-secondary  No intercurrent Ventricular tachycardia  Pacemaker Medtronic  .    PACs  Hypertension and orthostatic hypertension    No intercurrent ventricular tachycardia  Blood pressure remains poorly controlled.  We will have her increase from 2.5--5 mg.  Her orthostatic hypertension supports treatment directed at her resting blood pressure.  We will start with amlodipine changes noted above and then augmented therapy as indicated.  For now we will continue her on her statin.  There are issues in the literature regarding increased risk of bleeding between hydrophilic and hydrophilic statins; she currently is on a relatively hydrophilic statin in pravastatin.  I cannot find guidelines suggesting a target LDL in patients with intracranial hemorrhage on statin therapy so not withstanding the fact that the LDL is 106 we will continue the pravastatin at 40 mg.  Her dizziness with standing correlates with orthostatic hypertension, therapies as above   I,Stephanie Williams,acting as a scribe for Virl Axe, MD.,have documented all relevant documentation on the behalf of Virl Axe, MD,as directed by  Virl Axe, MD while in the presence of Virl Axe, MD.  I, Virl Axe, MD, have reviewed all documentation for this visit. The documentation on 10/31/20 for the exam, diagnosis, procedures, and orders are all accurate and complete.

## 2020-11-04 ENCOUNTER — Encounter: Payer: Self-pay | Admitting: Family Medicine

## 2020-11-04 ENCOUNTER — Ambulatory Visit: Payer: PRIVATE HEALTH INSURANCE | Admitting: Podiatry

## 2020-11-04 DIAGNOSIS — I10 Essential (primary) hypertension: Secondary | ICD-10-CM | POA: Insufficient documentation

## 2020-11-19 ENCOUNTER — Telehealth: Payer: Self-pay

## 2020-11-19 NOTE — Telephone Encounter (Signed)
Patient's daughter calls nurse line regarding patient tripping and falling in target earlier today. Reports that patient fell and hit nose and upper lip. Patient had no LOC or head injury.   EMS was called and evaluated patient on the scene. Patient states that she did not want to go to ED at the time of accident. EMS also said that patient appeared to be in stable condition to be monitored from home.   Daughter states that patient is acting normally and has some slight bruising to nose and swollen lip. Advised the use of cold therapy to help with pain and swelling.   Daughter will closely monitor patient for any changes in neurological symptoms and take to ED if symptoms appear.   Talbot Grumbling, RN

## 2021-01-28 ENCOUNTER — Telehealth: Payer: Self-pay

## 2021-02-03 ENCOUNTER — Ambulatory Visit (INDEPENDENT_AMBULATORY_CARE_PROVIDER_SITE_OTHER): Payer: Medicare Other

## 2021-02-03 DIAGNOSIS — I442 Atrioventricular block, complete: Secondary | ICD-10-CM

## 2021-02-04 LAB — CUP PACEART REMOTE DEVICE CHECK
Battery Impedance: 1576 Ohm
Battery Remaining Longevity: 40 mo
Battery Voltage: 2.76 V
Brady Statistic AP VP Percent: 35 %
Brady Statistic AP VS Percent: 0 %
Brady Statistic AS VP Percent: 65 %
Brady Statistic AS VS Percent: 0 %
Date Time Interrogation Session: 20221005124020
Implantable Lead Implant Date: 20140331
Implantable Lead Implant Date: 20140331
Implantable Lead Location: 753859
Implantable Lead Location: 753860
Implantable Lead Model: 5076
Implantable Lead Model: 5076
Implantable Pulse Generator Implant Date: 20140331
Lead Channel Impedance Value: 492 Ohm
Lead Channel Impedance Value: 494 Ohm
Lead Channel Pacing Threshold Amplitude: 0.375 V
Lead Channel Pacing Threshold Amplitude: 1.125 V
Lead Channel Pacing Threshold Pulse Width: 0.4 ms
Lead Channel Pacing Threshold Pulse Width: 0.4 ms
Lead Channel Setting Pacing Amplitude: 2 V
Lead Channel Setting Pacing Amplitude: 2.5 V
Lead Channel Setting Pacing Pulse Width: 0.4 ms
Lead Channel Setting Sensing Sensitivity: 2 mV

## 2021-02-06 ENCOUNTER — Other Ambulatory Visit: Payer: Self-pay

## 2021-02-06 ENCOUNTER — Other Ambulatory Visit: Payer: Self-pay | Admitting: *Deleted

## 2021-02-06 MED ORDER — QUETIAPINE FUMARATE 25 MG PO TABS: 25.0000 mg | ORAL_TABLET | Freq: Every day | ORAL | 3 refills | Status: DC

## 2021-02-06 MED ORDER — TRAMADOL HCL 50 MG PO TABS: ORAL_TABLET | ORAL | 3 refills | Status: DC

## 2021-02-11 NOTE — Progress Notes (Signed)
Remote pacemaker transmission.   

## 2021-03-18 ENCOUNTER — Ambulatory Visit (INDEPENDENT_AMBULATORY_CARE_PROVIDER_SITE_OTHER): Payer: Medicare Other | Admitting: Family Medicine

## 2021-03-18 ENCOUNTER — Other Ambulatory Visit: Payer: Self-pay

## 2021-03-18 ENCOUNTER — Encounter: Payer: Self-pay | Admitting: Family Medicine

## 2021-03-18 DIAGNOSIS — R42 Dizziness and giddiness: Secondary | ICD-10-CM | POA: Diagnosis not present

## 2021-03-18 DIAGNOSIS — I10 Essential (primary) hypertension: Secondary | ICD-10-CM | POA: Diagnosis not present

## 2021-03-18 NOTE — Progress Notes (Signed)
CHIEF COMPLAINT / HPI: She is here with her daughter for discussion of several issues 1.  Had a finger on the left hand that was quite painful for several days.  No specific injury.  She was having trouble straightening it all the way out.  Since she made the appointment, that is gotten much better. 2.  She has had several falls recently.  These are usually related to dizziness.  Says she is dizzy all of the time except when she is lying down with her eyes closed.  On the instance of these to fall she had onset of intense worsening of the dizziness and the next thing she knew she was on the ground.  She had no significant injuries from either fall. 3.  Continues to have deterioration of her vision.  Ophthalmologist has told her there is nothing different to do as she has macular degeneration.  She does use some drops in her eyes and says she follows the instructions on those. 4.  Decreased hearing.  Seems to have gotten much worse in the last 6 months or so.  Having trouble hearing even loud conversation.  Her daughter wonders if she has some cerumen in her ears.   PERTINENT  PMH / PSH: I have reviewed the patient's medications, allergies, past medical and surgical history, smoking status and updated in the EMR as appropriate. Neurology work-up by Dr. Laurena Slimmer at [facility] in June 2022 revealed ejection fraction of 50 to 55% on echocardiogram.  Intracranial hemorrhage in 2022 in the basal ganglia on the left which resulted in some diplopia initially.  At that evaluation, the diplopia had resolved but she was still having quite a bit of dizziness.  OBJECTIVE:  BP (!) 128/58   Pulse 86   Ht 5\' 5"  (1.651 m)   Wt 133 lb 9.6 oz (60.6 kg)   SpO2 99%   BMI 22.23 kg/m  GENERAL: Well-developed female no acute distress HEENT: TMs bilaterally have some mild retraction, the left one has some small amount of serous fluid but there is no significant cerumen.  Neck has full range of motion.  There is no  lymphadenopathy, no thyromegaly.  Pupils are equal round reactive to light and accommodation.  Extraocular muscles are intact. CARDIOVASCULAR: Regular rate and rhythm Lungs: Clear to auscultation bilaterally with normal work of breathing NEURO: Some mild to moderate hearing loss to low tone and conversation. MSK: She can rise from a chair with very minimal assistance from her walker.  She is walking with a rolling walker with a slightly stooped forward posture but walks at a fairly rapid rate. PSYCH: AxOx4. Good eye contact.. No psychomotor retardation or agitation. Appropriate speech fluency and content. Asks and answers questions appropriately. Mood is congruent.   ASSESSMENT / PLAN:   Dizziness and giddiness Reviewed her neurology work-up.  I think her dizziness is multifactorial.  Some residual from her prior basal ganglia stroke, she had some visual disturbance with that, additionally she has progressive macular degeneration and now she is having some decreased hearing.  Question of some potential peripheral neuropathy in her feet which she denies today but I think it is probably more of a proprioceptive issue and it is mild.  Combination of all these things however makes her somewhat unsteady on her feet.  I talked at length with him and I cannot think of anything additional to add from a medication standpoint.  She still doing relatively well given her age and some of her  comorbidities.  Particularly the history of basal ganglia stroke.  I do think getting her hearing evaluated might be beneficial and have given them information on that.  I reviewed her medications today we will make no changes.  I will see her back in 6 months, sooner with problems.  Essential hypertension Current blood pressure control looks appropriate.  I know at 1 point we had been concerned that we were getting her blood pressure too low but the 5 mg of amlodipine seems to be working well for her.  We will continue that  medication.  I do not think there is really any need to get blood work today.   [name] MD

## 2021-03-18 NOTE — Patient Instructions (Signed)
[facility], [place] [address]  Address: [address], [place], [place] [address] Hours:  Open ? Closes 5PM Phone: [phone]

## 2021-03-18 NOTE — Assessment & Plan Note (Signed)
Reviewed her neurology work-up.  I think her dizziness is multifactorial.  Some residual from her prior basal ganglia stroke, she had some visual disturbance with that, additionally she has progressive macular degeneration and now she is having some decreased hearing.  Question of some potential peripheral neuropathy in her feet which she denies today but I think it is probably more of a proprioceptive issue and it is mild.  Combination of all these things however makes her somewhat unsteady on her feet.  I talked at length with him and I cannot think of anything additional to add from a medication standpoint.  She still doing relatively well given her age and some of her comorbidities.  Particularly the history of basal ganglia stroke.  I do think getting her hearing evaluated might be beneficial and have given them information on that.  I reviewed her medications today we will make no changes.  I will see her back in 6 months, sooner with problems.

## 2021-03-18 NOTE — Assessment & Plan Note (Signed)
Current blood pressure control looks appropriate.  I know at 1 point we had been concerned that we were getting her blood pressure too low but the 5 mg of amlodipine seems to be working well for her.  We will continue that medication.  I do not think there is really any need to get blood work today.

## 2021-04-13 DIAGNOSIS — H903 Sensorineural hearing loss, bilateral: Secondary | ICD-10-CM | POA: Diagnosis not present

## 2021-05-05 ENCOUNTER — Ambulatory Visit (INDEPENDENT_AMBULATORY_CARE_PROVIDER_SITE_OTHER): Payer: Medicare Other

## 2021-05-05 ENCOUNTER — Other Ambulatory Visit: Payer: Self-pay | Admitting: *Deleted

## 2021-05-05 DIAGNOSIS — I442 Atrioventricular block, complete: Secondary | ICD-10-CM

## 2021-05-06 LAB — CUP PACEART REMOTE DEVICE CHECK
Battery Impedance: 1720 Ohm
Battery Remaining Longevity: 37 mo
Battery Voltage: 2.75 V
Brady Statistic AP VP Percent: 32 %
Brady Statistic AP VS Percent: 0 %
Brady Statistic AS VP Percent: 68 %
Brady Statistic AS VS Percent: 0 %
Date Time Interrogation Session: 20230104130448
Implantable Lead Implant Date: 20140331
Implantable Lead Implant Date: 20140331
Implantable Lead Location: 753859
Implantable Lead Location: 753860
Implantable Lead Model: 5076
Implantable Lead Model: 5076
Implantable Pulse Generator Implant Date: 20140331
Lead Channel Impedance Value: 472 Ohm
Lead Channel Impedance Value: 506 Ohm
Lead Channel Pacing Threshold Amplitude: 0.375 V
Lead Channel Pacing Threshold Amplitude: 1.125 V
Lead Channel Pacing Threshold Pulse Width: 0.4 ms
Lead Channel Pacing Threshold Pulse Width: 0.4 ms
Lead Channel Setting Pacing Amplitude: 2 V
Lead Channel Setting Pacing Amplitude: 2.5 V
Lead Channel Setting Pacing Pulse Width: 0.4 ms
Lead Channel Setting Sensing Sensitivity: 2 mV

## 2021-05-07 MED ORDER — PRAVASTATIN SODIUM 40 MG PO TABS: 40.0000 mg | ORAL_TABLET | Freq: Every day | ORAL | 3 refills | Status: DC

## 2021-05-15 NOTE — Progress Notes (Signed)
Remote pacemaker transmission.   

## 2021-06-02 ENCOUNTER — Other Ambulatory Visit: Payer: Self-pay

## 2021-06-05 MED ORDER — TRAMADOL HCL 50 MG PO TABS: ORAL_TABLET | ORAL | 3 refills | Status: DC

## 2021-06-09 ENCOUNTER — Other Ambulatory Visit: Payer: Self-pay

## 2021-06-09 MED ORDER — TRAMADOL HCL 50 MG PO TABS: ORAL_TABLET | ORAL | 3 refills | Status: DC

## 2021-06-09 NOTE — Telephone Encounter (Signed)
Daughter calls nurse line requesting a refill on Tramadol. This prescription was sent in on 1/31 to [facility].  Daughter would like this to go to her local pharmacy.   I have called [facility] and cancelled prescription.   Please resend to [facility] in [place].

## 2021-08-21 ENCOUNTER — Ambulatory Visit (INDEPENDENT_AMBULATORY_CARE_PROVIDER_SITE_OTHER): Payer: Medicare Other | Admitting: Podiatry

## 2021-08-21 ENCOUNTER — Encounter: Payer: Self-pay | Admitting: Podiatry

## 2021-08-21 DIAGNOSIS — I739 Peripheral vascular disease, unspecified: Secondary | ICD-10-CM

## 2021-08-21 DIAGNOSIS — B351 Tinea unguium: Secondary | ICD-10-CM | POA: Diagnosis not present

## 2021-08-21 DIAGNOSIS — L84 Corns and callosities: Secondary | ICD-10-CM

## 2021-08-21 DIAGNOSIS — M79609 Pain in unspecified limb: Secondary | ICD-10-CM

## 2021-08-30 NOTE — Progress Notes (Signed)
?  Subjective:  ?Patient ID: Shelley Black, female    DOB: 11-30-1930,  MRN: [id number] ? ?Shelley Black presents to clinic today for painful elongated mycotic toenails 1-5 bilaterally which are tender when wearing enclosed shoe gear. Pain is relieved with periodic professional debridement. ? ?PCP is Dickie La, MD , and last visit was March 18, 2021. ? ?No Known Allergies ? ?Review of Systems: Negative except as noted in the HPI. ? ?Objective: No changes noted in today's physical examination. ? ?Constitutional Pt is a pleasant 88 y.o. Caucasian female in NAD. AAO x 3.   ?Vascular Capillary fill time to digits <3 seconds b/l lower extremities. Faintly palpable DP pulse(s) b/l lower extremities. Nonpalpable PT pulse(s) b/l lower extremities. Pedal hair absent. Lower extremity skin temperature gradient within normal limits. No pain with calf compression b/l.  ?Neurologic Protective sensation decreased with 10 gram monofilament b/l. Proprioception intact bilaterally.  ?Dermatologic Pedal skin with normal turgor, texture and tone bilaterally. No open wounds bilaterally. No interdigital macerations bilaterally. Toenails 1-5 b/l elongated, discolored, dystrophic, thickened, crumbly with subungual debris and tenderness to dorsal palpation. Hyperkeratotic lesion(s) submet head 2 left foot.  No erythema, no edema, no drainage, no fluctuance.  ?Orthopedic: Normal muscle strength 5/5 to all lower extremity muscle groups bilaterally. No pain crepitus or joint limitation noted with ROM b/l. No gross bony deformities bilaterally.  ? ?Radiographs: None ? ?Assessment/Plan: ?1. Pain due to onychomycosis of nail   ?2. Callus   ?3. Peripheral vascular disease ([place])   ?  ? ?-Patient was evaluated and treated. All patient's and/or POA's questions/concerns answered on today's visit. ?-Patient to continue soft, supportive shoe gear daily. ?-Mycotic toenails 1-5 bilaterally were debrided in length and girth with  sterile nail nippers and dremel without incident. ?-Callus(es) submet head 2 left foot pared utilizing sterile scalpel blade without complication or incident. Total number debrided =1. ?-Patient/POA to call should there be question/concern in the interim.  ? ?Return in about 3 months (around 11/20/2021). ? ?Marzetta Board, DPM  ?

## 2021-09-01 ENCOUNTER — Ambulatory Visit (INDEPENDENT_AMBULATORY_CARE_PROVIDER_SITE_OTHER): Payer: Medicare Other

## 2021-09-01 DIAGNOSIS — I442 Atrioventricular block, complete: Secondary | ICD-10-CM | POA: Diagnosis not present

## 2021-09-01 LAB — CUP PACEART REMOTE DEVICE CHECK
Battery Impedance: 1864 Ohm
Battery Remaining Longevity: 31 mo
Battery Voltage: 2.74 V
Brady Statistic AP VP Percent: 32 %
Brady Statistic AP VS Percent: 0 %
Brady Statistic AS VP Percent: 68 %
Brady Statistic AS VS Percent: 0 %
Date Time Interrogation Session: 20230502113358
Implantable Lead Implant Date: 20140331
Implantable Lead Implant Date: 20140331
Implantable Lead Location: 753859
Implantable Lead Location: 753860
Implantable Lead Model: 5076
Implantable Lead Model: 5076
Implantable Pulse Generator Implant Date: 20140331
Lead Channel Impedance Value: 478 Ohm
Lead Channel Impedance Value: 520 Ohm
Lead Channel Pacing Threshold Amplitude: 0.375 V
Lead Channel Pacing Threshold Amplitude: 1.5 V
Lead Channel Pacing Threshold Pulse Width: 0.4 ms
Lead Channel Pacing Threshold Pulse Width: 0.4 ms
Lead Channel Setting Pacing Amplitude: 2 V
Lead Channel Setting Pacing Amplitude: 3 V
Lead Channel Setting Pacing Pulse Width: 0.4 ms
Lead Channel Setting Sensing Sensitivity: 2 mV

## 2021-09-16 NOTE — Progress Notes (Signed)
Remote pacemaker transmission.   

## 2021-09-29 ENCOUNTER — Other Ambulatory Visit: Payer: Self-pay | Admitting: *Deleted

## 2021-09-30 MED ORDER — TRAMADOL HCL 50 MG PO TABS: ORAL_TABLET | ORAL | 3 refills | Status: DC

## 2021-11-04 ENCOUNTER — Other Ambulatory Visit: Payer: Self-pay | Admitting: *Deleted

## 2021-11-04 MED ORDER — QUETIAPINE FUMARATE 25 MG PO TABS: 25.0000 mg | ORAL_TABLET | Freq: Every day | ORAL | 3 refills | Status: DC

## 2021-11-09 ENCOUNTER — Other Ambulatory Visit: Payer: Self-pay

## 2021-11-09 MED ORDER — AMLODIPINE BESYLATE 5 MG PO TABS: 5.0000 mg | ORAL_TABLET | Freq: Every day | ORAL | 0 refills | Status: DC

## 2021-11-24 ENCOUNTER — Ambulatory Visit (INDEPENDENT_AMBULATORY_CARE_PROVIDER_SITE_OTHER): Payer: Medicare Other | Admitting: Podiatry

## 2021-11-24 ENCOUNTER — Encounter: Payer: Self-pay | Admitting: Podiatry

## 2021-11-24 DIAGNOSIS — M79609 Pain in unspecified limb: Secondary | ICD-10-CM

## 2021-11-24 DIAGNOSIS — B351 Tinea unguium: Secondary | ICD-10-CM

## 2021-11-24 DIAGNOSIS — I739 Peripheral vascular disease, unspecified: Secondary | ICD-10-CM

## 2021-11-25 ENCOUNTER — Other Ambulatory Visit: Payer: Self-pay

## 2021-11-29 ENCOUNTER — Encounter: Payer: Self-pay | Admitting: Podiatry

## 2021-11-29 NOTE — Progress Notes (Signed)
  Subjective:  Patient ID: Shelley Black, female    DOB: [birth year],  MRN: [id number]  Shelley Black presents to clinic today for for at risk foot care. Patient has h/o PAD and callus(es) left lower extremity and painful thick toenails that are difficult to trim. Painful toenails interfere with ambulation. Aggravating factors include wearing enclosed shoe gear. Pain is relieved with periodic professional debridement. Painful calluses are aggravated when weightbearing with and without shoegear. Pain is relieved with periodic professional debridement.  New problem(s): None.   PCP is Dickie La, MD , and last visit was  March 18, 2021  No Known Allergies  Review of Systems: Negative except as noted in the HPI.  Objective: No changes noted in today's physical examination.  Constitutional Pt is a pleasant 88 y.o. Caucasian female in NAD. AAO x 3.   Vascular Capillary fill time to digits <3 seconds b/l lower extremities. Faintly palpable DP pulse(s) b/l lower extremities. Nonpalpable PT pulse(s) b/l lower extremities. Pedal hair absent. Lower extremity skin temperature gradient within normal limits. No pain with calf compression b/l.  Neurologic Protective sensation decreased with 10 gram monofilament b/l. Proprioception intact bilaterally.  Dermatologic Pedal skin with normal turgor, texture and tone bilaterally. No open wounds bilaterally. No interdigital macerations bilaterally. Toenails 1-5 b/l elongated, discolored, dystrophic, thickened, crumbly with subungual debris and tenderness to dorsal palpation.   Orthopedic: Normal muscle strength 5/5 to all lower extremity muscle groups bilaterally. No pain crepitus or joint limitation noted with ROM b/l. No gross bony deformities bilaterally. Utilizing rollator today.   Radiographs: None  Assessment/Plan: 1. Pain due to onychomycosis of nail   2. Peripheral vascular disease ([place])   -Examined patient. -Patient to continue  soft, supportive shoe gear daily. -Toenails 1-5 b/l were debrided in length and girth with sterile nail nippers and dremel without iatrogenic bleeding.  -Patient/POA to call should there be question/concern in the interim.   Return in about 3 months (around 02/24/2022).  Marzetta Board, DPM

## 2021-12-01 ENCOUNTER — Ambulatory Visit (INDEPENDENT_AMBULATORY_CARE_PROVIDER_SITE_OTHER): Payer: Medicare Other

## 2021-12-01 DIAGNOSIS — I442 Atrioventricular block, complete: Secondary | ICD-10-CM

## 2021-12-02 ENCOUNTER — Ambulatory Visit: Payer: Medicare Other | Admitting: Family Medicine

## 2021-12-02 ENCOUNTER — Other Ambulatory Visit: Payer: Self-pay

## 2021-12-02 ENCOUNTER — Encounter: Payer: Self-pay | Admitting: Family Medicine

## 2021-12-02 VITALS — BP 175/87 | HR 63 | Wt 140.2 lb

## 2021-12-02 DIAGNOSIS — I6502 Occlusion and stenosis of left vertebral artery: Secondary | ICD-10-CM

## 2021-12-02 DIAGNOSIS — C4491 Basal cell carcinoma of skin, unspecified: Secondary | ICD-10-CM | POA: Diagnosis not present

## 2021-12-02 DIAGNOSIS — I442 Atrioventricular block, complete: Secondary | ICD-10-CM

## 2021-12-02 DIAGNOSIS — D126 Benign neoplasm of colon, unspecified: Secondary | ICD-10-CM

## 2021-12-02 DIAGNOSIS — H532 Diplopia: Secondary | ICD-10-CM | POA: Diagnosis not present

## 2021-12-02 DIAGNOSIS — I469 Cardiac arrest, cause unspecified: Secondary | ICD-10-CM

## 2021-12-02 DIAGNOSIS — R42 Dizziness and giddiness: Secondary | ICD-10-CM

## 2021-12-02 DIAGNOSIS — I10 Essential (primary) hypertension: Secondary | ICD-10-CM

## 2021-12-02 DIAGNOSIS — F5101 Primary insomnia: Secondary | ICD-10-CM

## 2021-12-02 DIAGNOSIS — Z96653 Presence of artificial knee joint, bilateral: Secondary | ICD-10-CM | POA: Diagnosis not present

## 2021-12-02 NOTE — Assessment & Plan Note (Signed)
I do think her prior stroke is a little bit to do with her visual difficulties but there is nothing to do about this.  We have to balance blood pressure control for history of previous stroke and her cardiovascular disease against her focal left vertebral artery stenosis.

## 2021-12-02 NOTE — Assessment & Plan Note (Signed)
History of significant coronary disease in now has a pacemaker due to her complete heart block.  She is not having any cardiovascular symptoms at this time and we will continue current medications.

## 2021-12-02 NOTE — Assessment & Plan Note (Signed)
Do not want to get her blood pressure too low.  I would be happy with her systolic 1 46-2 60.

## 2021-12-02 NOTE — Assessment & Plan Note (Signed)
She has done quite well on the lower dose of The pain.  She does not want to discontinue that and I think that is fine.

## 2021-12-02 NOTE — Assessment & Plan Note (Signed)
Given her age, I do not think she is going to need any more screening

## 2021-12-02 NOTE — Assessment & Plan Note (Addendum)
Has been worked up and her issues are not amenable to current intervention.

## 2021-12-02 NOTE — Assessment & Plan Note (Signed)
No symptoms.  She has pacemaker in place.

## 2021-12-02 NOTE — Assessment & Plan Note (Signed)
Continue tramadol for arthritis pain

## 2021-12-02 NOTE — Assessment & Plan Note (Signed)
Looks loke BCC. Will refer to dermatology

## 2021-12-02 NOTE — Assessment & Plan Note (Signed)
I think the biggest contributor to this right now as her visual issues and not sure there is anything different we can do about that.  I did applaud her forward thinking and using a walker and being careful around the house.

## 2021-12-02 NOTE — Progress Notes (Signed)
    CHIEF COMPLAINT / HPI: 1.  Dizziness.  This is an ongoing in has gotten a little worse.  She thinks is mostly related to her vision loss.  Very aggravating that she is using a rolling walker and trying to be very careful. 2.  Home stressors: Daughter had near death experience from an accidental overdose. 3. F/u chronic issues--no new symptomsor concerns. 4. New lesion on her right cheek.  PERTINENT  PMH / PSH: I have reviewed the patient's medications, allergies, past medical and surgical history, smoking status and updated in the EMR as appropriate.   OBJECTIVE:  BP (!) 175/87   Pulse 63   Wt 140 lb 3.2 oz (63.6 kg)   SpO2 98%   BMI 23.33 kg/m   Vital signs reviewed. GENERAL: Well-developed, well-nourished, no acute distress. CARDIOVASCULAR: Regular rhythm, no murmur. LUNGS: Clear to auscultation bilaterally, no rales or wheeze. ABDOMEN: Soft positive bowel sounds NEURO: Little unsteady but walks with a walker.  Significant decrease in vision bilaterally.  No nystagmus. MSK: Movement of extremity x 4.  She rises from a chair with some assistance from her walker and her daughter. PSYCH: AxOx4. Good eye contact.. No psychomotor retardation or agitation. Appropriate speech fluency and content. Asks and answers questions appropriately. Mood is congruent. Sense of humor is intact. SKIN: see photo in media. Heaped borders on papule right cheek ASSESSMENT / PLAN:   Complete heart block (HCC) No symptoms.  She has pacemaker in place.  Dizziness and giddiness I think the biggest contributor to this right now as her visual issues and not sure there is anything different we can do about that.  I did applaud her forward thinking and using a walker and being careful around the house.  Double vision Has been worked up and her issues are not amenable to current intervention.  Essential hypertension Blood pressure initially was pretty high but when I rechecked it actually looked pretty  normal.  I think she was a little nervous about coming into clinic today discussing some of the recent stressors.  We will continue current blood pressure medicines.  Check some labs today.  Focal stenosis LEFT vertebral artery Do not want to get her blood pressure too low.  I would be happy with her systolic 1 79-8 60.  Cardiac arrest [facility]) History of significant coronary disease in now has a pacemaker due to her complete heart block.  She is not having any cardiovascular symptoms at this time and we will continue current medications.  ICH (intracerebral hemorrhage) ([place]) I do think her prior stroke is a little bit to do with her visual difficulties but there is nothing to do about this.  We have to balance blood pressure control for history of previous stroke and her cardiovascular disease against her focal left vertebral artery stenosis.  INSOMNIA She has done quite well on the lower dose of The pain.  She does not want to discontinue that and I think that is fine.  Benign neoplasm of colon Given her age, I do not think she is going to need any more screening  S/P TKR (total knee replacement) Continue tramadol for arthritis pain   [name] MD

## 2021-12-02 NOTE — Patient Instructions (Signed)
It was great to see you!  I will send you note about your labs.

## 2021-12-02 NOTE — Assessment & Plan Note (Signed)
Blood pressure initially was pretty high but when I rechecked it actually looked pretty normal.  I think she was a little nervous about coming into clinic today discussing some of the recent stressors.  We will continue current blood pressure medicines.  Check some labs today.

## 2021-12-03 LAB — LDL CHOLESTEROL, DIRECT: LDL Direct: 126 mg/dL — ABNORMAL HIGH (ref 0–99)

## 2021-12-03 LAB — COMPREHENSIVE METABOLIC PANEL
ALT: 10 IU/L (ref 0–32)
AST: 13 IU/L (ref 0–40)
Albumin/Globulin Ratio: 1.4 (ref 1.2–2.2)
Albumin: 4.1 g/dL (ref 3.7–4.7)
Alkaline Phosphatase: 62 IU/L (ref 44–121)
BUN/Creatinine Ratio: 32 — ABNORMAL HIGH (ref 12–28)
BUN: 28 mg/dL — ABNORMAL HIGH (ref 8–27)
Bilirubin Total: 0.3 mg/dL (ref 0.0–1.2)
CO2: 24 mmol/L (ref 20–29)
Calcium: 9.5 mg/dL (ref 8.7–10.3)
Chloride: 104 mmol/L (ref 96–106)
Creatinine, Ser: 0.88 mg/dL (ref 0.57–1.00)
Globulin, Total: 2.9 g/dL (ref 1.5–4.5)
Glucose: 74 mg/dL (ref 70–99)
Potassium: 4.5 mmol/L (ref 3.5–5.2)
Sodium: 145 mmol/L — ABNORMAL HIGH (ref 134–144)
Total Protein: 7 g/dL (ref 6.0–8.5)
eGFR: 63 mL/min/{1.73_m2} (ref >59)

## 2021-12-04 ENCOUNTER — Encounter: Payer: Self-pay | Admitting: Family Medicine

## 2021-12-30 NOTE — Progress Notes (Signed)
Remote pacemaker transmission.   

## 2022-01-01 ENCOUNTER — Encounter (HOSPITAL_BASED_OUTPATIENT_CLINIC_OR_DEPARTMENT_OTHER): Payer: Self-pay

## 2022-01-01 ENCOUNTER — Emergency Department (HOSPITAL_BASED_OUTPATIENT_CLINIC_OR_DEPARTMENT_OTHER)
Admission: EM | Admit: 2022-01-01 | Discharge: 2022-01-02 | Disposition: A | Discharge status: Home / Self Care | Payer: Medicare Other | Attending: Emergency Medicine | Admitting: Emergency Medicine

## 2022-01-01 DIAGNOSIS — Z79899 Other long term (current) drug therapy: Secondary | ICD-10-CM | POA: Insufficient documentation

## 2022-01-01 DIAGNOSIS — Z950 Presence of cardiac pacemaker: Secondary | ICD-10-CM | POA: Diagnosis not present

## 2022-01-01 DIAGNOSIS — Z7982 Long term (current) use of aspirin: Secondary | ICD-10-CM | POA: Insufficient documentation

## 2022-01-01 DIAGNOSIS — W01190A Fall on same level from slipping, tripping and stumbling with subsequent striking against furniture, initial encounter: Secondary | ICD-10-CM | POA: Diagnosis not present

## 2022-01-01 DIAGNOSIS — G9389 Other specified disorders of brain: Secondary | ICD-10-CM | POA: Diagnosis not present

## 2022-01-01 DIAGNOSIS — S0990XA Unspecified injury of head, initial encounter: Secondary | ICD-10-CM | POA: Diagnosis not present

## 2022-01-01 DIAGNOSIS — I10 Essential (primary) hypertension: Secondary | ICD-10-CM | POA: Diagnosis not present

## 2022-01-01 NOTE — ED Triage Notes (Signed)
Pt got dizzy and fell tonight, hit head on chair, small hematoma to R side of head, no LOC, not on thinners, denies any other injury.

## 2022-01-02 ENCOUNTER — Emergency Department (HOSPITAL_BASED_OUTPATIENT_CLINIC_OR_DEPARTMENT_OTHER): Payer: Medicare Other

## 2022-01-02 DIAGNOSIS — S0990XA Unspecified injury of head, initial encounter: Secondary | ICD-10-CM | POA: Diagnosis not present

## 2022-01-02 DIAGNOSIS — G9389 Other specified disorders of brain: Secondary | ICD-10-CM | POA: Diagnosis not present

## 2022-01-02 NOTE — ED Notes (Signed)
Written and verbal inst to pt and her family  Verbalized an understanding  To home with family

## 2022-01-02 NOTE — Discharge Instructions (Signed)
Take Tylenol 650 mg every 6 hours as needed for pain.  Return to the emergency department if you develop severe headache, seizures/convulsions, difficulty waking, worsening dizziness, or for other new and concerning symptoms.

## 2022-01-02 NOTE — ED Provider Notes (Addendum)
[facility] EMERGENCY DEPARTMENT Provider Note   CSN: [id number] Arrival date & time: 01/01/22  2335     History  Chief Complaint  Patient presents with   Shelley Black is a 88 y.o. female.  Patient is an 88 year old female with past medical history of hypertension, hyperlipidemia, cardiac pacemaker, and chronic dizziness.  Patient presenting today for evaluation of a head injury.  She reports becoming dizzy this evening (which is not unusual for her), then fell and struck her head on a wrought iron chair.  She describes swelling and pain to the right parietal region.  There was no reported loss of consciousness.  She has mild headache, but no neck pain, visual disturbances, numbness, or tingling.  There are no aggravating or alleviating factors.  The history is provided by the patient.       Home Medications Prior to Admission medications   Medication Sig Start Date End Date Taking? Authorizing Provider  amLODipine (NORVASC) 5 MG tablet Take 1 tablet (5 mg total) by mouth daily. 11/09/21   Deboraha Sprang, MD  aspirin 81 MG EC tablet Take 1 tablet by mouth daily. 10/29/20   [provider]  Cholecalciferol (VITAMIN D3) 2000 UNITS TABS Take 2,000 Units by mouth daily.     [provider]  Multiple Vitamin (MULTIVITAMIN WITH MINERALS) TABS Take 1 tablet by mouth daily.    [provider]  Omega-3 Fatty Acids (FISH OIL TRIPLE STRENGTH PO) Take 1 capsule by mouth daily.    [provider]  pravastatin (PRAVACHOL) 40 MG tablet Take 1 tablet (40 mg total) by mouth daily. 05/07/21   Dickie La, MD  QUEtiapine (SEROQUEL) 25 MG tablet Take 1 tablet (25 mg total) by mouth at bedtime. 11/04/21   Lenoria Chime, MD  traMADol (ULTRAM) 50 MG tablet TAKE 1 TO 2 TABLETS BY MOUTH EVERY 8 HOURS AS NEEDED . DO NOT EXCEED 6 PER 24 HOURS 09/30/21   Dickie La, MD      Allergies    Patient has no known allergies.    Review of Systems    Review of Systems  All other systems reviewed and are negative.   Physical Exam Updated Vital Signs BP (!) 189/81   Pulse 70   Temp 98.7 F (37.1 C) (Oral)   Resp 18   Ht '5\' 4"'$  (1.626 m)   Wt 62.6 kg   SpO2 97%   BMI 23.69 kg/m  Physical Exam Vitals and nursing note reviewed.  Constitutional:      General: She is not in acute distress.    Appearance: She is well-developed. She is not diaphoretic.  HENT:     Head: Normocephalic.     Comments: There is swelling to the right parietal region.  There is an abrasion, but no laceration or bleeding.  There is no palpable defect. Eyes:     Extraocular Movements: Extraocular movements intact.     Pupils: Pupils are equal, round, and reactive to light.  Cardiovascular:     Rate and Rhythm: Normal rate and regular rhythm.     Heart sounds: No murmur heard.    No friction rub. No gallop.  Pulmonary:     Effort: Pulmonary effort is normal. No respiratory distress.     Breath sounds: Normal breath sounds. No wheezing.  Abdominal:     General: Bowel sounds are normal. There is no distension.     Palpations: Abdomen is soft.  Tenderness: There is no abdominal tenderness.  Musculoskeletal:        General: Normal range of motion.     Cervical back: Normal range of motion and neck supple.  Skin:    General: Skin is warm and dry.  Neurological:     General: No focal deficit present.     Mental Status: She is alert and oriented to person, place, and time.     Cranial Nerves: No cranial nerve deficit.     Motor: No weakness.     Coordination: Coordination normal.     ED Results / Procedures / Treatments   Labs (all labs ordered are listed, but only abnormal results are displayed) Labs Reviewed - No data to display  EKG EKG Interpretation  Date/Time:  Friday January 01 2022 23:51:49 EDT Ventricular Rate:  77 PR Interval:    QRS Duration: 170 QT Interval:  486 QTC Calculation: 549 R Axis:   -79 Text  Interpretation: Ventricular-paced rhythm Abnormal ECG When compared with ECG of 03-Jul-2020 00:33, PREVIOUS ECG IS PRESENT Confirmed by Veryl Speak [phone]) on 01/02/2022 12:53:56 AM  Radiology No results found.  Procedures Procedures    Medications Ordered in ED Medications - No data to display  ED Course/ Medical Decision Making/ A&P  Patient presenting here with a head injury after becoming dizzy, falling, and striking her head on a wrought iron chair.  There was no loss of consciousness but she does have mild headache.  She is neurologically intact and head CT is negative.  Patient takes an 81 mg aspirin daily, but no other blood thinners.  I feel as though she can safely be discharged with as needed return.  Final Clinical Impression(s) / ED Diagnoses Final diagnoses:  None    Rx / DC Orders ED Discharge Orders     None         Veryl Speak, MD 01/02/22 3790    Veryl Speak, MD 01/02/22 [phone]

## 2022-01-05 ENCOUNTER — Telehealth: Payer: Self-pay

## 2022-01-05 ENCOUNTER — Other Ambulatory Visit: Payer: Self-pay

## 2022-01-05 MED ORDER — AMLODIPINE BESYLATE 5 MG PO TABS: 5.0000 mg | ORAL_TABLET | Freq: Every day | ORAL | 0 refills | Status: DC

## 2022-01-05 NOTE — Patient Outreach (Signed)
  Care Coordination TOC Note Transition Care Management Unsuccessful Follow-up Telephone Call  Date of discharge and from where:  [facility]- 01/01/22  Attempts:  1st Attempt  Reason for unsuccessful TCM follow-up call:  Unable to leave message  Johnney Killian, RN, BSN, CCM Care Management Coordinator [facility] Phone: [phone]: [phone]

## 2022-01-06 ENCOUNTER — Other Ambulatory Visit: Payer: Self-pay

## 2022-01-06 MED ORDER — AMLODIPINE BESYLATE 5 MG PO TABS: 5.0000 mg | ORAL_TABLET | Freq: Every day | ORAL | 1 refills | Status: DC

## 2022-01-06 NOTE — Telephone Encounter (Signed)
Pt's medication was sent to pt's pharmacy as requested. Confirmation received.  °

## 2022-01-29 DIAGNOSIS — L820 Inflamed seborrheic keratosis: Secondary | ICD-10-CM | POA: Diagnosis not present

## 2022-02-02 DIAGNOSIS — I491 Atrial premature depolarization: Secondary | ICD-10-CM | POA: Insufficient documentation

## 2022-02-03 ENCOUNTER — Encounter: Payer: Self-pay | Admitting: Internal Medicine

## 2022-02-03 ENCOUNTER — Ambulatory Visit: Payer: Medicare Other | Attending: Internal Medicine | Admitting: Internal Medicine

## 2022-02-03 VITALS — BP 142/70 | HR 87 | Ht 64.0 in | Wt 140.0 lb

## 2022-02-03 DIAGNOSIS — I491 Atrial premature depolarization: Secondary | ICD-10-CM | POA: Diagnosis not present

## 2022-02-03 DIAGNOSIS — Z950 Presence of cardiac pacemaker: Secondary | ICD-10-CM | POA: Diagnosis not present

## 2022-02-03 DIAGNOSIS — I442 Atrioventricular block, complete: Secondary | ICD-10-CM | POA: Diagnosis not present

## 2022-02-03 DIAGNOSIS — I472 Ventricular tachycardia, unspecified: Secondary | ICD-10-CM

## 2022-02-03 LAB — CBC
Hematocrit: 39.7 % (ref 34.0–46.6)
Hemoglobin: 13.5 g/dL (ref 11.1–15.9)
MCH: 30.9 pg (ref 26.6–33.0)
MCHC: 34 g/dL (ref 31.5–35.7)
MCV: 91 fL (ref 79–97)
Platelets: 360 10*3/uL (ref 150–450)
RBC: 4.37 x10E6/uL (ref 3.77–5.28)
RDW: 13.7 % (ref 11.7–15.4)
WBC: 10.4 10*3/uL (ref 3.4–10.8)

## 2022-02-03 MED ORDER — AMLODIPINE BESYLATE 5 MG PO TABS: 7.5000 mg | ORAL_TABLET | Freq: Every day | ORAL | 3 refills | Status: DC

## 2022-02-03 NOTE — Patient Instructions (Signed)
Medication Instructions:  Your physician has recommended you make the following change in your medication:   Increase your Amlodipine to 7.'5mg'$  (1.5 tablets) by mouth daily.  *If you need a refill on your cardiac medications before your next appointment, please call your pharmacy*   Lab Work: CBC today If you have labs (blood work) drawn today and your tests are completely normal, you will receive your results only by: [place] (if you have MyChart) OR A paper copy in the mail If you have any lab test that is abnormal or we need to change your treatment, we will call you to review the results.   Testing/Procedures: None ordered.    Follow-Up: At [facility], you and your health needs are our priority.  As part of our continuing mission to provide you with exceptional heart care, we have created designated Provider Care Teams.  These Care Teams include your primary Cardiologist (physician) and Advanced Practice Providers (APPs -  Physician Assistants and Nurse Practitioners) who all work together to provide you with the care you need, when you need it.  We recommend signing up for the patient portal called "MyChart".  Sign up information is provided on this After Visit Summary.  MyChart is used to connect with patients for Virtual Visits (Telemedicine).  Patients are able to view lab/test results, encounter notes, upcoming appointments, etc.  Non-urgent messages can be sent to your provider as well.   To learn more about what you can do with MyChart, go to [email or web address].    Your next appointment:   12 months with Dr Caryl Comes  Important Information About Sugar

## 2022-02-03 NOTE — Progress Notes (Signed)
Patient ID: Shelley Black, female   DOB: 1931/04/14, 88 y.o.   MRN: [id number]      Patient Care Team: Dickie La, MD as PCP - General   HPI  Shelley Black is a 87 y.o. female Seen following Medtronic pacemaker implantation 3/14 for complete heart block that was complicated by pause dependent polymorphic ventricular tachycardia    Intercurrently hospitalized 3/22 for intracranial hemorrhage attributed to hypertension. And she was told to stop her aspirin at that time.  She was maintained on Pravachol and her amlodipine was increased from 2.5--- than 5 mg a day.  She comes in today still on aspirin and the lower dose amlodipine.  We have tried to discontinue the aspirin a couple times, it is kind of like the blood from [place] where the bottle keeps finding its way back  The patient denies chest pain, shortness of breath, nocturnal dyspnea, orthopnea or peripheral edema.  There have been no palpitations or syncope.  Some lightheadedness with stanidng in addition to the chronic dizziness which she ascribes to her macular degeneration     Date Cr K  Hgb   9/21 0.98 4.3            3 /22  0.96 3.3 11.2   8/23 0.88 4.5      DATE TEST EF   3/22 Echo  50-55 %          Past Medical History:  Diagnosis Date   Arthritis    Cardiac pacemaker Medtronic    Complete heart block (HCC)    Heart murmur    not seen by cardiologist   Hypertension     Past Surgical History:  Procedure Laterality Date   APPENDECTOMY     CATARACT EXTRACTION, BILATERAL     LEFT HEART CATHETERIZATION WITH CORONARY ANGIOGRAM N/A 07/29/2012   Procedure: LEFT HEART CATHETERIZATION WITH CORONARY ANGIOGRAM;  Surgeon: Peter M Martinique, MD;  Location: [facility] CATH LAB;  Service: Cardiovascular;  Laterality: N/A;   PERMANENT PACEMAKER INSERTION N/A 07/31/2012   Procedure: PERMANENT PACEMAKER INSERTION;  Surgeon: Deboraha Sprang, MD;  Location: [facility] CATH LAB;  Service: Cardiovascular;  Laterality:  N/A;   ROTATOR CUFF REPAIR Right    TEMPORARY PACEMAKER INSERTION  07/29/2012   Procedure: TEMPORARY PACEMAKER INSERTION;  Surgeon: Peter M Martinique, MD;  Location: [facility] CATH LAB;  Service: Cardiovascular;;   THYROID SURGERY     TOTAL KNEE ARTHROPLASTY Left    TOTAL KNEE ARTHROPLASTY  12/24/2011   Procedure: TOTAL KNEE ARTHROPLASTY;  Surgeon: Alta Corning, MD;  Location: [facility];  Service: Orthopedics;  Laterality: Right;  general anesthesia with pre op femoral nerve block    Current Outpatient Medications  Medication Sig Dispense Refill   amLODipine (NORVASC) 5 MG tablet Take 1 tablet (5 mg total) by mouth daily. 30 tablet 1   aspirin 81 MG EC tablet Take 1 tablet by mouth daily.     Cholecalciferol (VITAMIN D3) 2000 UNITS TABS Take 2,000 Units by mouth daily.      Multiple Vitamin (MULTIVITAMIN WITH MINERALS) TABS Take 1 tablet by mouth daily.     Omega-3 Fatty Acids (FISH OIL TRIPLE STRENGTH PO) Take 1 capsule by mouth daily.     pravastatin (PRAVACHOL) 40 MG tablet Take 1 tablet (40 mg total) by mouth daily. 90 tablet 3   QUEtiapine (SEROQUEL) 25 MG tablet Take 1 tablet (25 mg total) by mouth at bedtime. 90 tablet 3   traMADol (ULTRAM) 50  MG tablet TAKE 1 TO 2 TABLETS BY MOUTH EVERY 8 HOURS AS NEEDED . DO NOT EXCEED 6 PER 24 HOURS 180 tablet 3   No current facility-administered medications for this visit.    No Known Allergies  Review of Systems negative except from HPI and PMH  Physical Exam: BP (!) 144/76   Pulse 87   Ht '5\' 4"'$  (1.626 m)   Wt 140 lb (63.5 kg)   SpO2 96%   BMI 24.03 kg/m  Well developed and well nourished in no acute distress HENT normal Neck supple with JVP-flat Clear Device pocket well healed; without hematoma or erythema.  There is no tethering  Regular rate and rhythm, no murmur Abd-soft with active BS No Clubbing cyanosis   edema Skin-warm and dry A & Oriented  Grossly normal sensory and motor function  ECG demonstrates sinus at 87 with nonsustained  runs of atrial tachycardia with P synchronous pacing Interval 17/16/45  Assessment and  Plan  Complete heart block -intermittent  Polymorphic ventricular tachycardia-secondary  No intercurrent Ventricular tachycardia  Pacemaker Medtronic  .    PACs/atrial tachycardia-nonsustained.    Hypertension and orthostatic hypertension  Anemia   No intercurrent ventricular tachycardia.  Blood pressure is better but will increase it from 5--7.5 as her orthostatic symptoms are related to orthostatic hypertension and hypotension.  Frequent nonsustained atrial tachycardia and tracking at a rates of 110-120; we have reprogrammed her device to max track rate of 90; she does not have VA conduction so he have turned off auto PVARP and she had no appreciable symptoms with upper rate Wenke block behavior. Hemoglobin 1 year ago was low; we will check it again  In light of this as well as no clear indication, we will discontinue aspirin

## 2022-02-17 DIAGNOSIS — H04123 Dry eye syndrome of bilateral lacrimal glands: Secondary | ICD-10-CM | POA: Diagnosis not present

## 2022-02-17 DIAGNOSIS — H353132 Nonexudative age-related macular degeneration, bilateral, intermediate dry stage: Secondary | ICD-10-CM | POA: Diagnosis not present

## 2022-02-17 DIAGNOSIS — H532 Diplopia: Secondary | ICD-10-CM | POA: Diagnosis not present

## 2022-02-17 DIAGNOSIS — H35372 Puckering of macula, left eye: Secondary | ICD-10-CM | POA: Diagnosis not present

## 2022-03-02 ENCOUNTER — Ambulatory Visit (INDEPENDENT_AMBULATORY_CARE_PROVIDER_SITE_OTHER): Payer: Medicare Other

## 2022-03-02 DIAGNOSIS — I442 Atrioventricular block, complete: Secondary | ICD-10-CM

## 2022-03-03 LAB — CUP PACEART REMOTE DEVICE CHECK
Battery Impedance: 2080 Ohm
Battery Remaining Longevity: 29 mo
Battery Voltage: 2.74 V
Brady Statistic AP VP Percent: 34 %
Brady Statistic AP VS Percent: 0 %
Brady Statistic AS VP Percent: 65 %
Brady Statistic AS VS Percent: 1 %
Date Time Interrogation Session: 20231101130530
Implantable Lead Connection Status: 753985
Implantable Lead Connection Status: 753985
Implantable Lead Implant Date: 20140331
Implantable Lead Implant Date: 20140331
Implantable Lead Location: 753859
Implantable Lead Location: 753860
Implantable Lead Model: 5076
Implantable Lead Model: 5076
Implantable Pulse Generator Implant Date: 20140331
Lead Channel Impedance Value: 468 Ohm
Lead Channel Impedance Value: 545 Ohm
Lead Channel Pacing Threshold Amplitude: 0.375 V
Lead Channel Pacing Threshold Amplitude: 1.375 V
Lead Channel Pacing Threshold Pulse Width: 0.4 ms
Lead Channel Pacing Threshold Pulse Width: 0.4 ms
Lead Channel Setting Pacing Amplitude: 2 V
Lead Channel Setting Pacing Amplitude: 2.75 V
Lead Channel Setting Pacing Pulse Width: 0.4 ms
Lead Channel Setting Sensing Sensitivity: 2.8 mV
Zone Setting Status: 755011
Zone Setting Status: 755011

## 2022-03-09 ENCOUNTER — Ambulatory Visit (INDEPENDENT_AMBULATORY_CARE_PROVIDER_SITE_OTHER): Payer: Medicare Other | Admitting: Podiatry

## 2022-03-09 ENCOUNTER — Encounter: Payer: Self-pay | Admitting: Podiatry

## 2022-03-09 DIAGNOSIS — B351 Tinea unguium: Secondary | ICD-10-CM

## 2022-03-09 DIAGNOSIS — I739 Peripheral vascular disease, unspecified: Secondary | ICD-10-CM | POA: Diagnosis not present

## 2022-03-09 DIAGNOSIS — M79609 Pain in unspecified limb: Secondary | ICD-10-CM

## 2022-03-14 NOTE — Progress Notes (Signed)
  Subjective:  Patient ID: Shelley Black, female    DOB: 20-Jul-1930,  MRN: [id number]  Shelley Black presents to clinic today for at risk foot care. Patient has h/o PAD and painful thick toenails that are difficult to trim. Pain interferes with ambulation. Aggravating factors include wearing enclosed shoe gear. Pain is relieved with periodic professional debridement.  Chief Complaint  Patient presents with   Nail Problem    RFC Biilateral nail trim 1-5.    New problem(s): None.   PCP is Shelley La, MD , and last visit was December 02, 2021.  No Known Allergies  Review of Systems: Negative except as noted in the HPI.  Objective: No changes noted in today's physical examination.  Shelley Black is a pleasant 88 y.o. female in NAD. AAO x 3. Vascular Capillary fill time to digits <3 seconds b/l lower extremities. Faintly palpable DP pulse(s) b/l lower extremities. Nonpalpable PT pulse(s) b/l lower extremities. Pedal hair absent. Lower extremity skin temperature gradient within normal limits. No pain with calf compression b/l.  Neurologic Protective sensation decreased with 10 gram monofilament b/l. Proprioception intact bilaterally.  Dermatologic Pedal skin with normal turgor, texture and tone bilaterally. No open wounds bilaterally. No interdigital macerations bilaterally. Toenails 1-5 b/l elongated, discolored, dystrophic, thickened, crumbly with subungual debris and tenderness to dorsal palpation.   Orthopedic: Normal muscle strength 5/5 to all lower extremity muscle groups bilaterally. No pain crepitus or joint limitation noted with ROM b/l. No gross bony deformities bilaterally. Utilizing rollator today.   Radiographs: None Assessment/Plan: 1. Pain due to onychomycosis of nail   2. Peripheral vascular disease (Shelley Black)     No orders of the defined types were placed in this encounter.   -Consent given for treatment as described below: -Continue supportive shoe  gear daily. -Mycotic toenails 1-5 bilaterally were debrided in length and girth with sterile nail nippers and dremel without incident. -Patient/POA to call should there be question/concern in the interim.   Return in about 3 months (around 06/09/2022).  Shelley Black, DPM

## 2022-03-17 NOTE — Progress Notes (Signed)
Remote pacemaker transmission.   

## 2022-06-01 ENCOUNTER — Ambulatory Visit: Payer: Medicare Other

## 2022-06-01 DIAGNOSIS — I442 Atrioventricular block, complete: Secondary | ICD-10-CM

## 2022-06-02 LAB — CUP PACEART REMOTE DEVICE CHECK
Battery Impedance: 2135 Ohm
Battery Remaining Longevity: 28 mo
Battery Voltage: 2.74 V
Brady Statistic AP VP Percent: 33 %
Brady Statistic AP VS Percent: 0 %
Brady Statistic AS VP Percent: 66 %
Brady Statistic AS VS Percent: 1 %
Date Time Interrogation Session: 20240131110041
Implantable Lead Connection Status: 753985
Implantable Lead Connection Status: 753985
Implantable Lead Implant Date: 20140331
Implantable Lead Implant Date: 20140331
Implantable Lead Location: 753859
Implantable Lead Location: 753860
Implantable Lead Model: 5076
Implantable Lead Model: 5076
Implantable Pulse Generator Implant Date: 20140331
Lead Channel Impedance Value: 488 Ohm
Lead Channel Impedance Value: 545 Ohm
Lead Channel Pacing Threshold Amplitude: 0.375 V
Lead Channel Pacing Threshold Amplitude: 1.25 V
Lead Channel Pacing Threshold Pulse Width: 0.4 ms
Lead Channel Pacing Threshold Pulse Width: 0.4 ms
Lead Channel Setting Pacing Amplitude: 2 V
Lead Channel Setting Pacing Amplitude: 2.75 V
Lead Channel Setting Pacing Pulse Width: 0.4 ms
Lead Channel Setting Sensing Sensitivity: 2.8 mV
Zone Setting Status: 755011
Zone Setting Status: 755011

## 2022-06-10 ENCOUNTER — Other Ambulatory Visit: Payer: Self-pay | Admitting: Family Medicine

## 2022-06-28 NOTE — Progress Notes (Signed)
Remote pacemaker transmission.   

## 2022-06-30 ENCOUNTER — Encounter: Payer: Self-pay | Admitting: Podiatry

## 2022-06-30 ENCOUNTER — Ambulatory Visit (INDEPENDENT_AMBULATORY_CARE_PROVIDER_SITE_OTHER): Payer: Medicare Other | Admitting: Podiatry

## 2022-06-30 VITALS — BP 151/72

## 2022-06-30 DIAGNOSIS — B351 Tinea unguium: Secondary | ICD-10-CM | POA: Diagnosis not present

## 2022-06-30 DIAGNOSIS — M79609 Pain in unspecified limb: Secondary | ICD-10-CM | POA: Diagnosis not present

## 2022-06-30 DIAGNOSIS — I739 Peripheral vascular disease, unspecified: Secondary | ICD-10-CM | POA: Diagnosis not present

## 2022-06-30 NOTE — Progress Notes (Unsigned)
  Subjective:  Patient ID: Shelley Black, female    DOB: 1930-07-28,  MRN: [id number]  Shelley Black presents to clinic today for {jgcomplaint:23593}  Chief Complaint  Patient presents with   Nail Problem    RFC PCP-[place], Clarise Cruz PCP VST- Fall 2023   New problem(s): None. {jgcomplaint:23593}  PCP is Dickie La, MD.  No Known Allergies  Review of Systems: Negative except as noted in the HPI.  Objective: No changes noted in today's physical examination. Vitals:   06/30/22 1522 06/30/22 1532  BP: (!) 176/76 (!) 151/72   Shelley Black is a pleasant 89 y.o. female {jgbodyhabitus:24098} AAO x 3. Vascular Capillary fill time to digits <3 seconds b/l lower extremities. Faintly palpable DP pulse(s) b/l lower extremities. Nonpalpable PT pulse(s) b/l lower extremities. Pedal hair absent. Lower extremity skin temperature gradient within normal limits. No pain with calf compression b/l.  Neurologic Protective sensation decreased with 10 gram monofilament b/l. Proprioception intact bilaterally.  Dermatologic Pedal skin with normal turgor, texture and tone bilaterally. No open wounds bilaterally. No interdigital macerations bilaterally. Toenails 1-5 b/l elongated, discolored, dystrophic, thickened, crumbly with subungual debris and tenderness to dorsal palpation.   Orthopedic: Normal muscle strength 5/5 to all lower extremity muscle groups bilaterally. No pain crepitus or joint limitation noted with ROM b/l. No gross bony deformities bilaterally. Utilizing rollator today.   Radiographs: None Assessment/Plan: 1. Pain due to onychomycosis of nail   2. Peripheral vascular disease ([place])     No orders of the defined types were placed in this encounter.   None -Patient/POA to call should there be question/concern in the interim.   Return in about 3 months (around 09/28/2022).  Marzetta Board, DPM

## 2022-08-31 ENCOUNTER — Ambulatory Visit: Payer: Medicare Other

## 2022-09-29 ENCOUNTER — Ambulatory Visit (INDEPENDENT_AMBULATORY_CARE_PROVIDER_SITE_OTHER): Payer: Medicare Other

## 2022-09-29 DIAGNOSIS — I442 Atrioventricular block, complete: Secondary | ICD-10-CM

## 2022-09-30 LAB — CUP PACEART REMOTE DEVICE CHECK
Battery Impedance: 2199 Ohm
Battery Remaining Longevity: 30 mo
Battery Voltage: 2.74 V
Brady Statistic AP VP Percent: 33 %
Brady Statistic AP VS Percent: 0 %
Brady Statistic AS VP Percent: 67 %
Brady Statistic AS VS Percent: 0 %
Date Time Interrogation Session: 20240529111318
Implantable Lead Connection Status: 753985
Implantable Lead Connection Status: 753985
Implantable Lead Implant Date: 20140331
Implantable Lead Implant Date: 20140331
Implantable Lead Location: 753859
Implantable Lead Location: 753860
Implantable Lead Model: 5076
Implantable Lead Model: 5076
Implantable Pulse Generator Implant Date: 20140331
Lead Channel Impedance Value: 498 Ohm
Lead Channel Impedance Value: 539 Ohm
Lead Channel Pacing Threshold Amplitude: 0.375 V
Lead Channel Pacing Threshold Amplitude: 1.25 V
Lead Channel Pacing Threshold Pulse Width: 0.4 ms
Lead Channel Pacing Threshold Pulse Width: 0.4 ms
Lead Channel Setting Pacing Amplitude: 2 V
Lead Channel Setting Pacing Amplitude: 2.5 V
Lead Channel Setting Pacing Pulse Width: 0.4 ms
Lead Channel Setting Sensing Sensitivity: 2.8 mV
Zone Setting Status: 755011
Zone Setting Status: 755011

## 2022-10-02 ENCOUNTER — Other Ambulatory Visit: Payer: Self-pay | Admitting: Family Medicine

## 2022-10-21 NOTE — Progress Notes (Signed)
Remote pacemaker transmission.   

## 2022-10-26 ENCOUNTER — Encounter: Payer: Self-pay | Admitting: Podiatry

## 2022-10-26 ENCOUNTER — Ambulatory Visit: Payer: Medicare Other | Admitting: Podiatry

## 2022-10-26 ENCOUNTER — Ambulatory Visit (INDEPENDENT_AMBULATORY_CARE_PROVIDER_SITE_OTHER): Payer: Medicare Other | Admitting: Podiatry

## 2022-10-26 VITALS — BP 171/70 | HR 69

## 2022-10-26 DIAGNOSIS — B351 Tinea unguium: Secondary | ICD-10-CM

## 2022-10-26 DIAGNOSIS — I739 Peripheral vascular disease, unspecified: Secondary | ICD-10-CM

## 2022-10-26 DIAGNOSIS — L600 Ingrowing nail: Secondary | ICD-10-CM | POA: Diagnosis not present

## 2022-10-26 DIAGNOSIS — M79609 Pain in unspecified limb: Secondary | ICD-10-CM

## 2022-10-26 DIAGNOSIS — Q828 Other specified congenital malformations of skin: Secondary | ICD-10-CM | POA: Diagnosis not present

## 2022-10-26 NOTE — Progress Notes (Signed)
  Subjective:  Patient ID: Shelley Black, female    DOB: [birth year],  MRN: [id number]  Shelley Black presents to clinic today for at risk foot care. Patient has h/o PAD and painful porokeratotic lesion(s) left foot and painful mycotic toenails that limit ambulation. Painful toenails interfere with ambulation. Aggravating factors include wearing enclosed shoe gear. Pain is relieved with periodic professional debridement. Painful porokeratotic lesions are aggravated when weightbearing with and without shoegear. Pain is relieved with periodic professional debridement.  Chief Complaint  Patient presents with   Nail Problem    "Cut my nails."   New problem(s): with chief concern of ingrown toenail(s) to right great toe. Onset was several days ago.  Pain is described as tender.  She has not attempted any treatment.  PCP is Shelley Ramp, MD.  No Known Allergies  Review of Systems: Negative except as noted in the HPI.  Objective:  Vitals:   10/26/22 1157  BP: (!) 171/70  Pulse: [address] Shelley Black is a pleasant 89 y.o. female WD, WN in NAD. AAO x 3. Vascular Capillary fill time to digits <3 seconds b/l lower extremities. Faintly palpable DP pulse(s) b/l lower extremities. Nonpalpable PT pulse(s) b/l lower extremities. Pedal hair absent. Lower extremity skin temperature gradient within normal limits. No pain with calf compression b/l.  Neurologic Protective sensation decreased with 10 gram monofilament b/l. Proprioception intact bilaterally.  Dermatologic Pedal skin with normal turgor, texture and tone bilaterally. No open wounds bilaterally. No interdigital macerations bilaterally. Toenails 1-5 b/l elongated, discolored, dystrophic, thickened, crumbly with subungual debris and tenderness to dorsal palpation.   Porokeratotic lesion(s) submet head 3 left foot. No erythema, no edema, no drainage, no fluctuance.  Incurvated nailplate right great toe lateral border(s) with  tenderness to palpation. Localized erythema with pinpoint serous drainage. No edema. No purulence.  Orthopedic: Normal muscle strength 5/5 to all lower extremity muscle groups bilaterally. No pain crepitus or joint limitation noted with ROM b/l. HAV with bunion b/l. Hammertoes lesser digits. Utilizing rollator today.   Radiographs: None  Assessment/Plan: 1. Pain due to onychomycosis of nail   2. Ingrown toenail without infection   3. Porokeratosis   4. Peripheral vascular disease (HCC)     -Patient was evaluated and treated. All patient's and/or POA's questions/concerns answered on today's visit. -Patient to continue soft, supportive shoe gear daily. -Toenails were debrided in length and girth 2-5 bilaterally and L hallux with sterile nail nippers and dremel without iatrogenic bleeding.  -No invasive procedure(s) performed. Offending nail border debrided and curretaged R hallux utilizing sterile nail nipper and currette. Border(s) cleansed with alcohol and triple antibiotic ointment applied. Patient/POA/Caregiver/Facility instructed to apply Neosporin Cream  to R hallux once daily for 7 days. Call office if there are any concerns. -Porokeratotic lesion(s) submet head 3 left foot pared and enucleated with sterile currette without incident. Total number of lesions debrided=1. -Patient/POA to call should there be question/concern in the interim.   Return in about 3 months (around 01/26/2023).  Freddie Breech, DPM

## 2022-11-04 ENCOUNTER — Other Ambulatory Visit: Payer: Self-pay | Admitting: Internal Medicine

## 2022-11-08 ENCOUNTER — Other Ambulatory Visit: Payer: Self-pay

## 2022-11-08 MED ORDER — AMLODIPINE BESYLATE 5 MG PO TABS: 7.5000 mg | ORAL_TABLET | Freq: Every day | ORAL | 1 refills | Status: DC

## 2022-11-08 NOTE — Telephone Encounter (Signed)
Pt's medication was sent to pt's pharmacy as requested. Confirmation received.  °

## 2022-11-09 ENCOUNTER — Telehealth: Payer: Self-pay | Admitting: Family Medicine

## 2022-11-09 NOTE — Telephone Encounter (Signed)
Patients daughter Sande Brothers) dropped off form at front desk for Handicap Placard.  Verified that patient section of form has been completed.  Last DOS/WCC with PCP was 12/02/2021.  Placed form in [facility] team folder to be completed by clinical staff.  [place] A [place]

## 2022-11-09 NOTE — Telephone Encounter (Signed)
Reviewed form and placed in PCP's box for completion.  .Deniece Rankin R Merdis Snodgrass, CMA  

## 2022-11-12 NOTE — Telephone Encounter (Signed)
Placed completed form in Nurse box

## 2022-11-15 ENCOUNTER — Telehealth: Payer: Self-pay

## 2022-11-15 NOTE — Telephone Encounter (Signed)
It should be OK, although any supplement can have issues. I doubt it will help so unless she has a pretty good improvement, I would not try it longer than a week. I know her tinnitus is very aggravating. We just do not have good meds to treat it. If she wants, I can get her to an audiologist, sometimes they can do some things with hering aids that "mask" the tinnitus.

## 2022-11-15 NOTE — Telephone Encounter (Signed)
Called patient's daughter to advise of form pick up. She had questions about medication while on the phone.   She is asking if patient can take OTC lipo-flavonoid to help with ringing ears and dizziness. She states that this is not a new problem and just wanted to make sure this would not interact with any other medications that patient is currently taking.   Please advise.   Veronda Prude, RN

## 2022-11-15 NOTE — Telephone Encounter (Signed)
Patient's daughter called and informed that forms are ready for pick up. Copy made and placed in batch scanning. Original placed at front desk for pick up.   [name], RN  

## 2022-11-16 NOTE — Telephone Encounter (Signed)
Spoke to patient's daughter Selena Batten and informed her of message from Dr. Jennette Kettle.  .Glennie Hawk, CMA

## 2022-11-17 DIAGNOSIS — H524 Presbyopia: Secondary | ICD-10-CM | POA: Diagnosis not present

## 2022-11-17 DIAGNOSIS — H353132 Nonexudative age-related macular degeneration, bilateral, intermediate dry stage: Secondary | ICD-10-CM | POA: Diagnosis not present

## 2022-11-17 DIAGNOSIS — H5201 Hypermetropia, right eye: Secondary | ICD-10-CM | POA: Diagnosis not present

## 2022-11-17 DIAGNOSIS — H35372 Puckering of macula, left eye: Secondary | ICD-10-CM | POA: Diagnosis not present

## 2022-11-17 DIAGNOSIS — H52221 Regular astigmatism, right eye: Secondary | ICD-10-CM | POA: Diagnosis not present

## 2022-11-17 DIAGNOSIS — H532 Diplopia: Secondary | ICD-10-CM | POA: Diagnosis not present

## 2022-11-30 ENCOUNTER — Ambulatory Visit: Payer: Medicare Other

## 2022-12-08 ENCOUNTER — Ambulatory Visit (INDEPENDENT_AMBULATORY_CARE_PROVIDER_SITE_OTHER): Payer: Medicare Other | Admitting: Family Medicine

## 2022-12-08 ENCOUNTER — Encounter: Payer: Self-pay | Admitting: Family Medicine

## 2022-12-08 VITALS — BP 152/54 | HR 64 | Wt 140.0 lb

## 2022-12-08 DIAGNOSIS — I10 Essential (primary) hypertension: Secondary | ICD-10-CM

## 2022-12-08 DIAGNOSIS — Z0000 Encounter for general adult medical examination without abnormal findings: Secondary | ICD-10-CM | POA: Diagnosis not present

## 2022-12-08 DIAGNOSIS — G8929 Other chronic pain: Secondary | ICD-10-CM

## 2022-12-08 DIAGNOSIS — I442 Atrioventricular block, complete: Secondary | ICD-10-CM

## 2022-12-08 DIAGNOSIS — F5101 Primary insomnia: Secondary | ICD-10-CM

## 2022-12-08 DIAGNOSIS — M25511 Pain in right shoulder: Secondary | ICD-10-CM

## 2022-12-08 DIAGNOSIS — E785 Hyperlipidemia, unspecified: Secondary | ICD-10-CM | POA: Diagnosis not present

## 2022-12-09 ENCOUNTER — Encounter: Payer: Self-pay | Admitting: Family Medicine

## 2022-12-09 DIAGNOSIS — Z0000 Encounter for general adult medical examination without abnormal findings: Secondary | ICD-10-CM | POA: Insufficient documentation

## 2022-12-09 NOTE — Assessment & Plan Note (Signed)
Her pravastatin medication will be continued regardless of LDL level so no need to check that today.  She is tolerating it well.

## 2022-12-09 NOTE — Progress Notes (Addendum)
CHIEF COMPLAINT / HPI: Here for follow-up chronic conditions.  Feels like she is overall doing well.  Will celebrate her 89th birthday tomorrow.  No particular issues.  Feels like all her medical problems are stable.  Came in because she thought it was probably time for me to see her.  Her only complaint is of some right shoulder pain particularly when trying to raise her arm above shoulder height.  This has been going on for many months.  Keeps her awake at night.  No particular specific injury that she was aware of.  Right-hand-dominant. Accompanied by her daughter, Selena Batten.   PERTINENT  PMH / PSH: I have reviewed the patient's medications, allergies, past medical and surgical history, smoking status and updated in the EMR as appropriate.   OBJECTIVE:  BP (!) 152/54   Pulse 64   Wt 140 lb (63.5 kg)   SpO2 98%   BMI 24.03 kg/m  GENERAL: Well-developed female frail but very feisty.   MSK: [facility] with a walker.   NEURO: Gait is short stride and a little unsteady.  She needs a little bit of assistance when rising from a chair. CV: Regular rate and rhythm PSYCH: AxOx4. Good eye contact.. No psychomotor retardation or agitation. Appropriate speech fluency and content. Asks and answers questions appropriately. Mood is congruent.  Sense of humor is intact. SHOULDER: Left: Range of motion above shoulder height is quite painful but she has forward flexion 110.  External rotation is normal and painless. RESPIRATORY: No increased work of breathing.   PROCEDURE: INJECTION: Patient was given informed consent, signed copy in the chart. Appropriate time out was taken. Area prepped and draped in usual sterile fashion. Ethyl chloride was  used for local anesthesia. A 21 gauge 1 1/2 inch needle was used..  1 cc of methylprednisolone 40 mg/ml plus 3 cc of 1% lidocaine without epinephrine was injected into the right subacromial bursa using a(n) posterior approach.   The patient tolerated the procedure well.  There were no complications. Post procedure instructions were given.  ASSESSMENT / PLAN: Shoulder pain: Most likely rotator cuff tendinitis.  She has had issues with this in the past.  At this point she is having quite a bit of impact on her ADLs so we discussed and she opted to try corticosteroid injection which we did today.  She will let me know if this does not improve sufficiently or if she has other issues with this.  Essential hypertension Her goal blood pressure is less than 160 systolic and she is at goal.  This is consistent with her cardiologist care as she has significant guidance pulse pressure.  Will continue amlodipine 5 mg, taking 1-1/2 tabs a day for a total of 7.5 mg.  She is also taking aspirin.  Complete heart block [facility]) She is followed by cardiology and has pacer.  Hyperlipidemia Her pravastatin medication will be continued regardless of LDL level so no need to check that today.  She is tolerating it well.  INSOMNIA She has done quite well on the reduced dose of Seroquel and will continue at this dose.  Well adult health check Reviewed health maintenance.  She is up-to-date on most things.  She has had full set of COVID vaccines but we do not have the details.  She has also had prior full course of pneumonia shots.  She does not wish to pursue any additional immunizations today.  She is aged out of colonoscopy and mammogram.  She does not  want shingles vaccine.  I agree with all of the above.  I also do not think we need to do any lab work today.  She is quite happy with this plan.  I will see her back whenever she needs to return and otherwise in 1 year.   Denny Levy MD

## 2022-12-09 NOTE — Assessment & Plan Note (Signed)
She has done quite well on the reduced dose of Seroquel and will continue at this dose.

## 2022-12-09 NOTE — Addendum Note (Signed)
Addended by: Nestor Ramp on: 12/09/2022 07:23 PM   Modules accepted: Level of Service

## 2022-12-09 NOTE — Assessment & Plan Note (Signed)
Reviewed health maintenance.  She is up-to-date on most things.  She has had full set of COVID vaccines but we do not have the details.  She has also had prior full course of pneumonia shots.  She does not wish to pursue any additional immunizations today.  She is aged out of colonoscopy and mammogram.  She does not want shingles vaccine.  I agree with all of the above.  I also do not think we need to do any lab work today.  She is quite happy with this plan.  I will see her back whenever she needs to return and otherwise in 1 year.

## 2022-12-09 NOTE — Assessment & Plan Note (Signed)
She is followed by cardiology and has pacer.

## 2022-12-09 NOTE — Assessment & Plan Note (Signed)
Her goal blood pressure is less than 160 systolic and she is at goal.  This is consistent with her cardiologist care as she has significant guidance pulse pressure.  Will continue amlodipine 5 mg, taking 1-1/2 tabs a day for a total of 7.5 mg.  She is also taking aspirin.

## 2022-12-29 ENCOUNTER — Ambulatory Visit: Payer: Medicare Other

## 2023-02-09 ENCOUNTER — Encounter: Payer: Self-pay | Admitting: Podiatry

## 2023-02-09 ENCOUNTER — Ambulatory Visit (INDEPENDENT_AMBULATORY_CARE_PROVIDER_SITE_OTHER): Payer: Medicare Other | Admitting: Podiatry

## 2023-02-09 DIAGNOSIS — I739 Peripheral vascular disease, unspecified: Secondary | ICD-10-CM | POA: Diagnosis not present

## 2023-02-09 DIAGNOSIS — M79609 Pain in unspecified limb: Secondary | ICD-10-CM | POA: Diagnosis not present

## 2023-02-09 DIAGNOSIS — Q828 Other specified congenital malformations of skin: Secondary | ICD-10-CM | POA: Diagnosis not present

## 2023-02-09 DIAGNOSIS — B351 Tinea unguium: Secondary | ICD-10-CM

## 2023-02-09 NOTE — Progress Notes (Signed)
  Subjective:  Patient ID: Shelley Black, female    DOB: 1930/07/15,  MRN: [id number]  89 y.o. female presents to clinic with  at risk foot care. Patient has h/o PAD and painful porokeratotic lesion(s) left lower extremity and painful mycotic toenails that limit ambulation. Painful toenails interfere with ambulation. Aggravating factors include wearing enclosed shoe gear. Pain is relieved with periodic professional debridement. Painful porokeratotic lesions are aggravated when weightbearing with and without shoegear. Pain is relieved with periodic professional debridement.  Chief Complaint  Patient presents with   RFC   Callouses    LEFT FOOT    New problem(s): None   PCP is Nestor Ramp, MD.  No Known Allergies  Review of Systems: Negative except as noted in the HPI.   Objective:  Elina Streng is a pleasant 89 y.o. female WD, WN in NAD.Marland Kitchen  Vascular Examination: Vascular status intact b/l with faintly palpable DP pulses. PT pulses nonpalpable b/l. CFT immediate b/l. No pain with calf compression b/l. Skin temperature gradient WNL b/l. Pedal hair absent. No edema noted b/l LE.  Neurological Examination: Sensation grossly intact b/l with 10 gram monofilament. Vibratory sensation intact b/l.   Dermatological Examination: Pedal skin with normal turgor, texture and tone b/l.   Toenails 1-5 b/l thick, discolored, elongated with subungual debris and pain on dorsal palpation. No hyperkeratotic lesions noted b/l.   Porokeratotic lesion(s) submet head 3 left foot. No erythema, no edema, no drainage, no fluctuance.  Musculoskeletal Examination: Muscle strength 5/5 to b/l LE. HAV with bunion bilaterally and hammertoes 2-5 b/l. Utilizes rollator for ambulation assistance.  Radiographs: None  Last A1c:       No data to display         Assessment:   1. Pain due to onychomycosis of nail   2. Porokeratosis   3. Peripheral vascular disease (HCC)    Plan:  -Consent  given for treatment as described below: -Examined patient. -No new findings. No new orders. -Continue supportive shoe gear daily. -Toenails 1-5 b/l were debrided in length and girth with sterile nail nippers and dremel without iatrogenic bleeding.  -Porokeratotic lesion(s) submet head 3 left foot pared and enucleated with sterile currette without incident. Total number of lesions debrided=1. -Patient/POA to call should there be question/concern in the interim.  Return in about 3 months (around 05/12/2023).  Freddie Breech, DPM

## 2023-02-17 ENCOUNTER — Ambulatory Visit (INDEPENDENT_AMBULATORY_CARE_PROVIDER_SITE_OTHER): Payer: Medicare Other

## 2023-02-17 DIAGNOSIS — I442 Atrioventricular block, complete: Secondary | ICD-10-CM

## 2023-02-18 LAB — CUP PACEART REMOTE DEVICE CHECK
Battery Impedance: 2313 Ohm
Battery Remaining Longevity: 26 mo
Battery Voltage: 2.73 V
Brady Statistic AP VP Percent: 32 %
Brady Statistic AP VS Percent: 0 %
Brady Statistic AS VP Percent: 67 %
Brady Statistic AS VS Percent: 0 %
Date Time Interrogation Session: 20241017114153
Implantable Lead Connection Status: 753985
Implantable Lead Connection Status: 753985
Implantable Lead Implant Date: 20140331
Implantable Lead Implant Date: 20140331
Implantable Lead Location: 753859
Implantable Lead Location: 753860
Implantable Lead Model: 5076
Implantable Lead Model: 5076
Implantable Pulse Generator Implant Date: 20140331
Lead Channel Impedance Value: 517 Ohm
Lead Channel Impedance Value: 538 Ohm
Lead Channel Pacing Threshold Amplitude: 0.375 V
Lead Channel Pacing Threshold Amplitude: 1.375 V
Lead Channel Pacing Threshold Pulse Width: 0.4 ms
Lead Channel Pacing Threshold Pulse Width: 0.4 ms
Lead Channel Setting Pacing Amplitude: 2 V
Lead Channel Setting Pacing Amplitude: 2.75 V
Lead Channel Setting Pacing Pulse Width: 0.4 ms
Lead Channel Setting Sensing Sensitivity: 2.8 mV
Zone Setting Status: 755011
Zone Setting Status: 755011

## 2023-02-22 ENCOUNTER — Encounter: Payer: Self-pay | Admitting: Internal Medicine

## 2023-02-22 ENCOUNTER — Ambulatory Visit: Payer: Medicare Other | Attending: Internal Medicine | Admitting: Internal Medicine

## 2023-02-22 VITALS — BP 151/72 | HR 74 | Ht 64.0 in | Wt 141.8 lb

## 2023-02-22 DIAGNOSIS — I491 Atrial premature depolarization: Secondary | ICD-10-CM | POA: Diagnosis not present

## 2023-02-22 DIAGNOSIS — I472 Ventricular tachycardia, unspecified: Secondary | ICD-10-CM | POA: Diagnosis not present

## 2023-02-22 DIAGNOSIS — Z79899 Other long term (current) drug therapy: Secondary | ICD-10-CM | POA: Diagnosis not present

## 2023-02-22 DIAGNOSIS — I442 Atrioventricular block, complete: Secondary | ICD-10-CM | POA: Diagnosis not present

## 2023-02-22 DIAGNOSIS — Z950 Presence of cardiac pacemaker: Secondary | ICD-10-CM

## 2023-02-22 MED ORDER — AMLODIPINE BESYLATE 5 MG PO TABS: 5.0000 mg | ORAL_TABLET | Freq: Every day | ORAL | 1 refills | Status: DC

## 2023-02-22 NOTE — Progress Notes (Signed)
Patient ID: Shelley Black, female   DOB: 04-30-1931, 89 y.o.   MRN: [id number]      Patient Care Team: Nestor Ramp, MD as PCP - General   HPI  Shelley Black is a 89 y.o. female Seen following Medtronic pacemaker implantation 3/14 for complete heart block that was complicated by pause dependent polymorphic ventricular tachycardia    Intercurrently hospitalized 3/22 for intracranial hemorrhage attributed to hypertension. And she was told to stop her aspirin at that time.  She was maintained on Pravachol and her amlodipine was increased from 2.5--- than 5 mg a day.   The patient denies chest pain, shortness of breath, nocturnal dyspnea, orthopnea or peripheral edema.  There have been no palpitations or syncope.  Complains of dizziness aggravated by standing persist with sitting associated with presyncope and fear of falling  Showers on  chair.    No chest pain or shortness of breath or edema Date Cr K  Hgb   9/21 0.98 4.3            3 /22  0.96 3.3 11.2   8/23 0.88 4.5            DATE TEST EF   3/22 Echo  50-55 %          Past Medical History:  Diagnosis Date   Arthritis    Cardiac pacemaker Medtronic    Complete heart block (HCC)    Heart murmur    not seen by cardiologist   Hypertension     Past Surgical History:  Procedure Laterality Date   APPENDECTOMY     CATARACT EXTRACTION, BILATERAL     LEFT HEART CATHETERIZATION WITH CORONARY ANGIOGRAM N/A 07/29/2012   Procedure: LEFT HEART CATHETERIZATION WITH CORONARY ANGIOGRAM;  Surgeon: Peter M Swaziland, MD;  Location: [facility] CATH LAB;  Service: Cardiovascular;  Laterality: N/A;   PERMANENT PACEMAKER INSERTION N/A 07/31/2012   Procedure: PERMANENT PACEMAKER INSERTION;  Surgeon: Duke Salvia, MD;  Location: [facility] CATH LAB;  Service: Cardiovascular;  Laterality: N/A;   ROTATOR CUFF REPAIR Right    TEMPORARY PACEMAKER INSERTION  07/29/2012   Procedure: TEMPORARY PACEMAKER INSERTION;  Surgeon: Peter M Swaziland, MD;   Location[facility] LAB;  Service: Cardiovascular;;   THYROID SURGERY     TOTAL KNEE ARTHROPLASTY Left    TOTAL KNEE ARTHROPLASTY  12/24/2011   Procedure: TOTAL KNEE ARTHROPLASTY;  Surgeon: Harvie Junior, MD;  Location: [facility] OR;  Service: Orthopedics;  Laterality: Right;  general anesthesia with pre op femoral nerve block    Current Outpatient Medications  Medication Sig Dispense Refill   amLODipine (NORVASC) 5 MG tablet Take 1.5 tablets (7.5 mg total) by mouth daily. 135 tablet 1   aspirin 81 MG EC tablet Take 1 tablet by mouth daily.     Cholecalciferol (VITAMIN D3) 2000 UNITS TABS Take 2,000 Units by mouth daily.      Multiple Vitamin (MULTIVITAMIN WITH MINERALS) TABS Take 1 tablet by mouth daily.     Omega-3 Fatty Acids (FISH OIL TRIPLE STRENGTH PO) Take 1 capsule by mouth daily.     pravastatin (PRAVACHOL) 40 MG tablet TAKE 1 TABLET(40 MG) BY MOUTH DAILY 90 tablet 3   QUEtiapine (SEROQUEL) 25 MG tablet TAKE 1 TABLET(25 MG) BY MOUTH AT BEDTIME 90 tablet 3   traMADol (ULTRAM) 50 MG tablet TAKE 1 TO 2 TABLETS BY MOUTH EVERY 8 HOURS AS NEEDED . DO NOT EXCEED 6 PER 24 HOURS 180 tablet 3   No  current facility-administered medications for this visit.    No Known Allergies  Review of Systems negative except from HPI and PMH  Physical Exam: BP (!) 151/72 (Patient Position: Standing)   Pulse 74   Ht 5\' 4"  (1.626 m)   Wt 141 lb 12.8 oz (64.3 kg)   SpO2 96%   BMI 24.34 kg/m  Well developed and well nourished in no acute distress HENT normal Neck supple with JVP-flat Clear Device pocket well healed; without hematoma or erythema.  There is no tethering  Regular rate and rhythm, no  gallop No / murmur Abd-soft with active BS No Clubbing cyanosis  edema Skin-warm and dry A & Oriented  Grossly normal sensory and motor function  ECG sinus rhythm with P synchronous pacing some irregularity  Device function is normal. Programming changes none  See Paceart for details    Assessment and   Plan  Complete heart block -intermittent  Polymorphic ventricular tachycardia-secondary  No intercurrent Ventricular tachycardia  Pacemaker Medtronic  .    PACs/atrial tachycardia-nonsustained.    Dizziness  Hypertension and orthostatic hypertension  Anemia   Continues with device dependence.  Some PACs.  Hypertension with orthostatic hypetension in the past now with orthostatic hypotension.  We will decrease amlodipine from 7.5--5.  Will see her again in a few weeks and if her symptoms persist would recommend abdominal binding and/or thigh sleeves.  Raise the head of the bed 4 inches.  Has a history of mild anemia.  Will recheck her hemoglobin as well as metabolic profile today.  Again have recommended discontinuing aspirin now she tells me she will stop it because he takes it for her headaches and Tylenol does not work

## 2023-02-22 NOTE — Patient Instructions (Addendum)
Medication Instructions:  Your physician has recommended you make the following change in your medication:   ** Decrease Amlodipine to 5mg  - 1 tablet by mouth daily  *If you need a refill on your cardiac medications before your next appointment, please call your pharmacy*   Lab Work: BMET and CBC today  If you have labs (blood work) drawn today and your tests are completely normal, you will receive your results only by: MyChart Message (if you have MyChart) OR A paper copy in the mail If you have any lab test that is abnormal or we need to change your treatment, we will call you to review the results.   Testing/Procedures: None ordered.    Follow-Up: At [facility], you and your health needs are our priority.  As part of our continuing mission to provide you with exceptional heart care, we have created designated Provider Care Teams.  These Care Teams include your primary Cardiologist (physician) and Advanced Practice Providers (APPs -  Physician Assistants and Nurse Practitioners) who all work together to provide you with the care you need, when you need it.  We recommend signing up for the patient portal called "MyChart".  Sign up information is provided on this After Visit Summary.  MyChart is used to connect with patients for Virtual Visits (Telemedicine).  Patients are able to view lab/test results, encounter notes, upcoming appointments, etc.  Non-urgent messages can be sent to your provider as well.   To learn more about what you can do with MyChart, go to [email or web address].    Your next appointment:   4-6 weeks with Dr Odessa Fleming PA  Raise head of bed as discussed by Dr Graciela Husbands

## 2023-02-23 LAB — BASIC METABOLIC PANEL
BUN/Creatinine Ratio: 39 — ABNORMAL HIGH (ref 12–28)
BUN: 33 mg/dL (ref 10–36)
CO2: 27 mmol/L (ref 20–29)
Calcium: 9.8 mg/dL (ref 8.7–10.3)
Chloride: 103 mmol/L (ref 96–106)
Creatinine, Ser: 0.85 mg/dL (ref 0.57–1.00)
Glucose: 83 mg/dL (ref 70–99)
Potassium: 4.4 mmol/L (ref 3.5–5.2)
Sodium: 144 mmol/L (ref 134–144)
eGFR: 65 mL/min/{1.73_m2} (ref >59)

## 2023-02-23 LAB — CBC
Hematocrit: 41.2 % (ref 34.0–46.6)
Hemoglobin: 13.4 g/dL (ref 11.1–15.9)
MCH: 31 pg (ref 26.6–33.0)
MCHC: 32.5 g/dL (ref 31.5–35.7)
MCV: 95 fL (ref 79–97)
Platelets: 353 10*3/uL (ref 150–450)
RBC: 4.32 x10E6/uL (ref 3.77–5.28)
RDW: 12.7 % (ref 11.7–15.4)
WBC: 10.2 10*3/uL (ref 3.4–10.8)

## 2023-02-25 ENCOUNTER — Ambulatory Visit: Payer: Medicare Other

## 2023-03-01 ENCOUNTER — Ambulatory Visit: Payer: Medicare Other

## 2023-03-09 NOTE — Progress Notes (Signed)
Remote pacemaker transmission.   

## 2023-03-11 ENCOUNTER — Other Ambulatory Visit: Payer: Self-pay | Admitting: Family Medicine

## 2023-03-26 ENCOUNTER — Other Ambulatory Visit: Payer: Self-pay | Admitting: Internal Medicine

## 2023-03-27 NOTE — Progress Notes (Unsigned)
Cardiology Office Note:  .   Date:  03/27/2023  ID:  Shelley Black, DOB 1930-09-04, MRN [id number] PCP: Nestor Ramp, MD  [facility] Providers Cardiologist:  None {  History of Present Illness: .   Shelley Black is a 89 y.o. female w/PMHx of symptomatic bradycardia (w/hx of pause dependent PMVT) s/p PPM. HTN (w/hx of ICH 2/2 HTN) PAT, PACs  She saw Dr. Graciela Husbands 02/22/23, doing ok though getting dizzy with/during standing, though persisted with sitting, near syncope. Dicussed her hx of orthostatic hypERtension in the the past not with orthostatic hyPOtension and her amlodipine dose reduced Advised raisng the head of the bed as well, planned for labs Also advised stopping ASA   Today's visit is scheduled as 4-6 week follow up  ROS:   She is with her daughter today Her dizziness is constant and for years, pre-dates her stroke (years ago by their memory) It is a sense of unbalance, swaying. When ambulating tends to drift/stumble. She has not fallen in a long time, the last many months ago and tripped on the dog.  Uses a rolling walker when out and about She has macular degeneration and suspects this plays a role. When looking at her daughter she sees one person, when looking left at me she sees double. When riding in a car she has to close or cover one (either) eye to see correctly. None of this new She does not feel like she is fainting or going to faint  She will get lightheaded sometimes standing up, no syncope, near syncope  No CP, palpitations or cardiac awareness No SOB    Device information MDT dual chamber PPM implanted 07/21/2012   Studies Reviewed: Marland Kitchen    EKG not done today  DEVICE interrogation remote 02/17/23 reviewed by myself Battery and lead measurements were good <0.1% AMS PACs NSVTs  Saw Dr. Graciela Husbands 02/22/23: discussed normal device check, PACs this day as well  Not checked today   07/03/20: TTE 1. Left ventricular ejection  fraction, by estimation, is 50 to 55%. The  left ventricle has low normal function. Abnormal (paradoxical) septal  motion, consistent with RV pacemaker. Otherwise, no regional wall motion  abnormalities. There is moderate  hypertrophy of the basal septum. The rest of the LV segments demonstrate  mild hypertrophy. Left ventricular diastolic parameters are consistent  with Grade I diastolic dysfunction (impaired relaxation).   2. Right ventricular systolic function is normal. The right ventricular  size is normal. There is mildly elevated pulmonary artery systolic  pressure. The estimated right ventricular systolic pressure is 36.4 mmHg.   3. Left atrial size was mildly dilated.   4. There is mild thickening of the mitral valve leaflet(s). There is mild  calcification of the mitral valve leaflet(s). Moderate mitral annular  calcification. Trivial mitral valve regurgitation.   5. The aortic valve is tricuspid. There is mild calcification of the  aortic valve. There is mild thickening of the aortic valve. Aortic valve  regurgitation is not visualized. Mild to moderate aortic valve  sclerosis/calcification is present, without any  evidence of aortic stenosis.   6. The inferior vena cava is normal in size with <50% respiratory  variability, suggesting right atrial pressure of 8 mmHg.    Risk Assessment/Calculations:    Physical Exam:   VS:  There were no vitals taken for this visit.   Wt Readings from Last 3 Encounters:  02/22/23 141 lb 12.8 oz (64.3 kg)  12/08/22 140 lb (63.5 kg)  02/03/22 140 lb (63.5 kg)    GEN: Well nourished, well developed in no acute distress NECK: No JVD; No carotid bruits CARDIAC: RRR, no murmurs, rubs, gallops RESPIRATORY:  CTA b/l without rales, wheezing or rhonchi  ABDOMEN: Soft, non-tender, non-distended EXTREMITIES:  No edema; No deformity   PPM site: is stable, no thinning, fluctuation, tethering  ASSESSMENT AND PLAN: .    PPM Not checked to  day Checked twice last month, stable function  HTN Orthostatic hypotension No changes today Difficult given her hx of ICH 2/2 HTN  4. Dizziness As discussed above is constant and unchanged for years Advised d/w her PMD, perhaps revisit neurology. Perhaps her macular degeneration plays a role in some of her symptoms     Dispo: she prefers to be seen here annually, remotes as usual  Signed, Sheilah Pigeon, PA-C

## 2023-03-29 ENCOUNTER — Ambulatory Visit: Payer: Medicare Other | Attending: Physician Assistant | Admitting: Physician Assistant

## 2023-03-29 ENCOUNTER — Encounter: Payer: Self-pay | Admitting: Physician Assistant

## 2023-03-29 VITALS — BP 130/80 | HR 70 | Ht 64.0 in | Wt 138.8 lb

## 2023-03-29 DIAGNOSIS — Z950 Presence of cardiac pacemaker: Secondary | ICD-10-CM | POA: Diagnosis not present

## 2023-03-29 DIAGNOSIS — I10 Essential (primary) hypertension: Secondary | ICD-10-CM

## 2023-03-29 DIAGNOSIS — R42 Dizziness and giddiness: Secondary | ICD-10-CM

## 2023-03-29 NOTE — Patient Instructions (Signed)
Medication Instructions:  No changes *If you need a refill on your cardiac medications before your next appointment, please call your pharmacy*   Lab Work: none If you have labs (blood work) drawn today and your tests are completely normal, you will receive your results only by: MyChart Message (if you have MyChart) OR A paper copy in the mail If you have any lab test that is abnormal or we need to change your treatment, we will call you to review the results.   Testing/Procedures: none   Follow-Up: At [facility], you and your health needs are our priority.  As part of our continuing mission to provide you with exceptional heart care, we have created designated Provider Care Teams.  These Care Teams include your primary Cardiologist (physician) and Advanced Practice Providers (APPs -  Physician Assistants and Nurse Practitioners) who all work together to provide you with the care you need, when you need it.  We recommend signing up for the patient portal called "MyChart".  Sign up information is provided on this After Visit Summary.  MyChart is used to connect with patients for Virtual Visits (Telemedicine).  Patients are able to view lab/test results, encounter notes, upcoming appointments, etc.  Non-urgent messages can be sent to your provider as well.   To learn more about what you can do with MyChart, go to [email or web address].    Your next appointment:   12 month(s)  Provider:   Sherryl Manges, MD or Francis Dowse, PA-C

## 2023-03-30 ENCOUNTER — Ambulatory Visit: Payer: Medicare Other

## 2023-04-14 ENCOUNTER — Ambulatory Visit: Payer: Medicare Other

## 2023-05-12 ENCOUNTER — Other Ambulatory Visit: Payer: Self-pay | Admitting: Internal Medicine

## 2023-05-19 ENCOUNTER — Ambulatory Visit (INDEPENDENT_AMBULATORY_CARE_PROVIDER_SITE_OTHER): Payer: No Typology Code available for payment source

## 2023-05-19 DIAGNOSIS — I442 Atrioventricular block, complete: Secondary | ICD-10-CM

## 2023-05-19 DIAGNOSIS — H35372 Puckering of macula, left eye: Secondary | ICD-10-CM | POA: Diagnosis not present

## 2023-05-19 DIAGNOSIS — H353112 Nonexudative age-related macular degeneration, right eye, intermediate dry stage: Secondary | ICD-10-CM | POA: Diagnosis not present

## 2023-05-19 DIAGNOSIS — H353124 Nonexudative age-related macular degeneration, left eye, advanced atrophic with subfoveal involvement: Secondary | ICD-10-CM | POA: Diagnosis not present

## 2023-05-19 DIAGNOSIS — Z961 Presence of intraocular lens: Secondary | ICD-10-CM | POA: Diagnosis not present

## 2023-05-20 LAB — CUP PACEART REMOTE DEVICE CHECK
Battery Impedance: 2610 Ohm
Battery Remaining Longevity: 25 mo
Battery Voltage: 2.73 V
Brady Statistic AP VP Percent: 31 %
Brady Statistic AP VS Percent: 0 %
Brady Statistic AS VP Percent: 69 %
Brady Statistic AS VS Percent: 0 %
Date Time Interrogation Session: 20250116123613
Implantable Lead Connection Status: 753985
Implantable Lead Connection Status: 753985
Implantable Lead Implant Date: 20140331
Implantable Lead Implant Date: 20140331
Implantable Lead Location: 753859
Implantable Lead Location: 753860
Implantable Lead Model: 5076
Implantable Lead Model: 5076
Implantable Pulse Generator Implant Date: 20140331
Lead Channel Impedance Value: 515 Ohm
Lead Channel Impedance Value: 555 Ohm
Lead Channel Pacing Threshold Amplitude: 0.5 V
Lead Channel Pacing Threshold Amplitude: 1.125 V
Lead Channel Pacing Threshold Pulse Width: 0.4 ms
Lead Channel Pacing Threshold Pulse Width: 0.4 ms
Lead Channel Setting Pacing Amplitude: 2 V
Lead Channel Setting Pacing Amplitude: 2.5 V
Lead Channel Setting Pacing Pulse Width: 0.4 ms
Lead Channel Setting Sensing Sensitivity: 2.8 mV
Zone Setting Status: 755011
Zone Setting Status: 755011

## 2023-05-24 ENCOUNTER — Ambulatory Visit: Payer: Medicare Other | Admitting: Podiatry

## 2023-05-31 ENCOUNTER — Ambulatory Visit: Payer: Medicare Other | Admitting: Podiatry

## 2023-05-31 ENCOUNTER — Ambulatory Visit: Payer: Medicare Other

## 2023-06-06 ENCOUNTER — Ambulatory Visit (INDEPENDENT_AMBULATORY_CARE_PROVIDER_SITE_OTHER): Payer: No Typology Code available for payment source | Admitting: Podiatry

## 2023-06-06 ENCOUNTER — Encounter: Payer: Self-pay | Admitting: Podiatry

## 2023-06-06 DIAGNOSIS — M79674 Pain in right toe(s): Secondary | ICD-10-CM | POA: Diagnosis not present

## 2023-06-06 DIAGNOSIS — B351 Tinea unguium: Secondary | ICD-10-CM | POA: Diagnosis not present

## 2023-06-06 DIAGNOSIS — M79675 Pain in left toe(s): Secondary | ICD-10-CM

## 2023-06-06 NOTE — Progress Notes (Unsigned)
    Subjective:  Patient ID: Shelley Black, female    DOB: 10-Dec-1930,  MRN: [id number]  Shelley Black presents to clinic today for:  Chief Complaint  Patient presents with   Debridement    Trim toenails   Patient notes nails are thick, discolored, elongated and painful in shoegear when trying to ambulate.    PCP is Nestor Ramp, MD.  Past Medical History:  Diagnosis Date   Arthritis    Cardiac pacemaker Medtronic    Complete heart block (HCC)    Heart murmur    not seen by cardiologist   Hypertension     Past Surgical History:  Procedure Laterality Date   APPENDECTOMY     CATARACT EXTRACTION, BILATERAL     LEFT HEART CATHETERIZATION WITH CORONARY ANGIOGRAM N/A 07/29/2012   Procedure: LEFT HEART CATHETERIZATION WITH CORONARY ANGIOGRAM;  Surgeon: Peter M Swaziland, MD;  Location: [facility] CATH LAB;  Service: Cardiovascular;  Laterality: N/A;   PERMANENT PACEMAKER INSERTION N/A 07/31/2012   Procedure: PERMANENT PACEMAKER INSERTION;  Surgeon: Duke Salvia, MD;  [facility] C[facility] LAB;  Service: Cardiovascular;  Laterality: N/A;   ROTATOR CUFF REPAIR Right    TEMPORARY PACEMAKER INSERTION  07/29/2012   Procedure: TEMPORARY PACEMAKER INSERTION;  Surgeon: Peter M Swaziland, MD;  Location: [facility] CATH LAB;  Service: Cardiovascular;;   THYROID SURGERY     TOTAL KNEE ARTHROPLASTY Left    TOTAL KNEE ARTHROPLASTY  12/24/2011   Procedure: TOTAL KNEE ARTHROPLASTY;  Surgeon: Harvie Junior, MD;  Location: [facility] OR;  Service: Orthopedics;  Laterality: Right;  general anesthesia with pre op femoral nerve block   No Known Allergies  Review of Systems: Negative except as noted in the HPI.  Objective:  Shelley Black is a pleasant 89 y.o. female in NAD. AAO x 3.  Vascular Examination: Capillary refill time is 3-5 seconds to toes bilateral. Palpable pedal pulses b/l LE. Digital hair present b/l.  Skin temperature gradient WNL b/l. No varicosities b/l. No cyanosis noted b/l.    Dermatological Examination: Pedal skin with normal turgor, texture and tone b/l. No open wounds. No interdigital macerations b/l. Toenails x10 are 3mm thick, discolored, dystrophic with subungual debris. There is pain with compression of the nail plates.  They are elongated x10  Assessment/Plan: 1. Pain due to onychomycosis of toenails of both feet    The mycotic toenails were sharply debrided x10 with sterile nail nippers and a power debriding burr to decrease bulk/thickness and length.    Return in about 3 months (around 09/03/2023) for RFC.   Clerance Lav, DPM, FACFAS [facility]     [address] [place], [place] [address]                Office [phone]  Fax [phone]

## 2023-06-09 ENCOUNTER — Ambulatory Visit: Payer: No Typology Code available for payment source

## 2023-06-28 NOTE — Progress Notes (Signed)
 Remote pacemaker transmission.

## 2023-06-28 NOTE — Addendum Note (Signed)
 Addended by: Elease Etienne A on: 06/28/2023 10:12 AM   Modules accepted: Orders

## 2023-06-29 ENCOUNTER — Ambulatory Visit: Payer: Medicare Other

## 2023-07-06 DIAGNOSIS — H43813 Vitreous degeneration, bilateral: Secondary | ICD-10-CM | POA: Diagnosis not present

## 2023-07-06 DIAGNOSIS — H353134 Nonexudative age-related macular degeneration, bilateral, advanced atrophic with subfoveal involvement: Secondary | ICD-10-CM | POA: Diagnosis not present

## 2023-08-18 ENCOUNTER — Ambulatory Visit (INDEPENDENT_AMBULATORY_CARE_PROVIDER_SITE_OTHER): Payer: Medicare Other

## 2023-08-18 DIAGNOSIS — I442 Atrioventricular block, complete: Secondary | ICD-10-CM

## 2023-08-23 LAB — CUP PACEART REMOTE DEVICE CHECK
Battery Impedance: 2630 Ohm
Battery Remaining Longevity: 25 mo
Battery Voltage: 2.73 V
Brady Statistic AP VP Percent: 34 %
Brady Statistic AP VS Percent: 0 %
Brady Statistic AS VP Percent: 66 %
Brady Statistic AS VS Percent: 0 %
Date Time Interrogation Session: 20250422112450
Implantable Lead Connection Status: 753985
Implantable Lead Connection Status: 753985
Implantable Lead Implant Date: 20140331
Implantable Lead Implant Date: 20140331
Implantable Lead Location: 753859
Implantable Lead Location: 753860
Implantable Lead Model: 5076
Implantable Lead Model: 5076
Implantable Pulse Generator Implant Date: 20140331
Lead Channel Impedance Value: 532 Ohm
Lead Channel Impedance Value: 536 Ohm
Lead Channel Pacing Threshold Amplitude: 0.375 V
Lead Channel Pacing Threshold Amplitude: 1.25 V
Lead Channel Pacing Threshold Pulse Width: 0.4 ms
Lead Channel Pacing Threshold Pulse Width: 0.4 ms
Lead Channel Setting Pacing Amplitude: 2 V
Lead Channel Setting Pacing Amplitude: 2.5 V
Lead Channel Setting Pacing Pulse Width: 0.4 ms
Lead Channel Setting Sensing Sensitivity: 2.8 mV
Zone Setting Status: 755011
Zone Setting Status: 755011

## 2023-08-30 ENCOUNTER — Ambulatory Visit: Payer: Medicare Other

## 2023-09-05 ENCOUNTER — Ambulatory Visit (INDEPENDENT_AMBULATORY_CARE_PROVIDER_SITE_OTHER): Payer: No Typology Code available for payment source | Admitting: Podiatry

## 2023-09-05 DIAGNOSIS — B351 Tinea unguium: Secondary | ICD-10-CM

## 2023-09-05 DIAGNOSIS — L84 Corns and callosities: Secondary | ICD-10-CM

## 2023-09-05 DIAGNOSIS — M79675 Pain in left toe(s): Secondary | ICD-10-CM

## 2023-09-05 DIAGNOSIS — I739 Peripheral vascular disease, unspecified: Secondary | ICD-10-CM | POA: Diagnosis not present

## 2023-09-05 DIAGNOSIS — M79674 Pain in right toe(s): Secondary | ICD-10-CM

## 2023-09-05 NOTE — Progress Notes (Unsigned)
       Subjective:  Patient ID: Shelley Black, female    DOB: 11-27-1930,  MRN: [id number]  Shelley Black presents to clinic today for:  Chief Complaint  Patient presents with   RFC    RFC. Toe nail trim, Non Diabetic.   Patient notes nails are thick and elongated, causing pain in shoe gear when ambulating.  She has a callus left submet 2  PCP is Charise Companion, MD.  Last seen 12/08/22  Past Medical History:  Diagnosis Date   Arthritis    Cardiac pacemaker Medtronic    Complete heart block (HCC)    Heart murmur    not seen by cardiologist   Hypertension     No Known Allergies  Objective:  Shelley Black is a pleasant 89 y.o. female in NAD. AAO x 3.  Vascular Examination: Patient has palpable DP pulse, absent PT pulse bilateral.  Delayed capillary refill bilateral toes.  Sparse digital hair bilateral.  Proximal to distal cooling WNL bilateral.    Dermatological Examination: Interspaces are clear with no open lesions noted bilateral.  Skin is shiny and atrophic bilateral.  Nails are 3-55mm thick, with yellowish/brown discoloration, subungual debris and distal onycholysis x10.  There is pain with compression of nails x10.  There are hyperkeratotic lesions noted left submet 2 .  Patient qualifies for at-risk foot care because of PVD .  Assessment/Plan: 1. Pain due to onychomycosis of toenails of both feet   2. Peripheral vascular disease (HCC)   3. Callus of foot    Mycotic nails x10 were sharply debrided with sterile nail nippers and power debriding burr to decrease bulk and length.  Hyperkeratotic lesion x1 was shaved with #312 blade.   Return in about 3 months (around 12/06/2023) for RFC.   Joe Murders, DPM, FACFAS [facility]     [address] [place], [place] [address]                Office [phone]  Fax [phone]

## 2023-09-28 ENCOUNTER — Ambulatory Visit: Payer: Medicare Other

## 2023-09-28 NOTE — Addendum Note (Signed)
 Addended by: Lott Rouleau A on: 09/28/2023 11:38 AM   Modules accepted: Orders

## 2023-09-28 NOTE — Progress Notes (Signed)
 Remote pacemaker transmission.

## 2023-10-05 ENCOUNTER — Encounter: Payer: Self-pay | Admitting: Family Medicine

## 2023-10-05 DIAGNOSIS — H02833 Dermatochalasis of right eye, unspecified eyelid: Secondary | ICD-10-CM | POA: Diagnosis not present

## 2023-10-05 DIAGNOSIS — H353134 Nonexudative age-related macular degeneration, bilateral, advanced atrophic with subfoveal involvement: Secondary | ICD-10-CM | POA: Diagnosis not present

## 2023-10-05 DIAGNOSIS — H43813 Vitreous degeneration, bilateral: Secondary | ICD-10-CM | POA: Diagnosis not present

## 2023-10-05 DIAGNOSIS — H02836 Dermatochalasis of left eye, unspecified eyelid: Secondary | ICD-10-CM | POA: Diagnosis not present

## 2023-11-17 ENCOUNTER — Ambulatory Visit: Payer: Medicare Other

## 2023-11-17 DIAGNOSIS — I442 Atrioventricular block, complete: Secondary | ICD-10-CM

## 2023-11-23 LAB — CUP PACEART REMOTE DEVICE CHECK
Battery Impedance: 2905 Ohm
Battery Remaining Longevity: 22 mo
Battery Voltage: 2.72 V
Brady Statistic AP VP Percent: 36 %
Brady Statistic AP VS Percent: 0 %
Brady Statistic AS VP Percent: 64 %
Brady Statistic AS VS Percent: 0 %
Date Time Interrogation Session: 20250722113813
Implantable Lead Connection Status: 753985
Implantable Lead Connection Status: 753985
Implantable Lead Implant Date: 20140331
Implantable Lead Implant Date: 20140331
Implantable Lead Location: 753859
Implantable Lead Location: 753860
Implantable Lead Model: 5076
Implantable Lead Model: 5076
Implantable Pulse Generator Implant Date: 20140331
Lead Channel Impedance Value: 525 Ohm
Lead Channel Impedance Value: 558 Ohm
Lead Channel Pacing Threshold Amplitude: 0.375 V
Lead Channel Pacing Threshold Amplitude: 1.125 V
Lead Channel Pacing Threshold Pulse Width: 0.4 ms
Lead Channel Pacing Threshold Pulse Width: 0.4 ms
Lead Channel Setting Pacing Amplitude: 2 V
Lead Channel Setting Pacing Amplitude: 2.5 V
Lead Channel Setting Pacing Pulse Width: 0.4 ms
Lead Channel Setting Sensing Sensitivity: 2.8 mV
Zone Setting Status: 755011
Zone Setting Status: 755011

## 2023-11-29 ENCOUNTER — Ambulatory Visit: Payer: Medicare Other

## 2023-12-06 ENCOUNTER — Ambulatory Visit: Admitting: Podiatry

## 2023-12-17 ENCOUNTER — Other Ambulatory Visit: Payer: Self-pay | Admitting: Family Medicine

## 2023-12-20 ENCOUNTER — Encounter: Payer: Self-pay | Admitting: Podiatry

## 2023-12-20 ENCOUNTER — Ambulatory Visit (INDEPENDENT_AMBULATORY_CARE_PROVIDER_SITE_OTHER): Admitting: Podiatry

## 2023-12-20 DIAGNOSIS — M79674 Pain in right toe(s): Secondary | ICD-10-CM | POA: Diagnosis not present

## 2023-12-20 DIAGNOSIS — M79675 Pain in left toe(s): Secondary | ICD-10-CM | POA: Diagnosis not present

## 2023-12-20 DIAGNOSIS — I739 Peripheral vascular disease, unspecified: Secondary | ICD-10-CM | POA: Diagnosis not present

## 2023-12-20 DIAGNOSIS — B351 Tinea unguium: Secondary | ICD-10-CM | POA: Diagnosis not present

## 2023-12-20 NOTE — Progress Notes (Signed)
    Subjective:  Patient ID: Dorothyann Foy Crew, female    DOB: [birth year],  MRN: [id number]  Libia Fazzini Nocito presents to clinic today for:  Chief Complaint  Patient presents with   [facility]    RFC nail trim.  Non Diabetic. No anti coag.  No calluses   Patient notes nails are thick and elongated, causing pain in shoe gear when ambulating.    PCP is Rosalynn Camie CROME, MD. last seen around 11/22/2023  Past Medical History:  Diagnosis Date   Arthritis    Cardiac pacemaker Medtronic    Complete heart block (HCC)    Heart murmur    not seen by cardiologist   Hypertension    No Known Allergies  Objective:  Iyonna Rish is a pleasant 89 y.o. female in NAD. AAO x 3.  Vascular Examination: Patient has palpable DP pulse, absent PT pulse bilateral.  Delayed capillary refill bilateral toes.  Sparse digital hair bilateral.  Proximal to distal cooling WNL bilateral.    Dermatological Examination: Interspaces are clear with no open lesions noted bilateral.  Skin is shiny and atrophic bilateral.  Nails are 3-81mm thick, with yellowish/brown discoloration, subungual debris and distal onycholysis x10.  There is pain with compression of nails x10.    Patient qualifies for at-risk foot care because of PVD.  Assessment/Plan: 1. Pain due to onychomycosis of toenails of both feet   2. Peripheral vascular disease (HCC)    Mycotic nails x10 were sharply debrided with sterile nail nippers and power debriding burr to decrease bulk and length.   Return in about 3 months (around 03/21/2024) for RFC (sign commercial ABN for [facility] next visit).  An ABN was not signed for today's visit.  We had been informed by our billing office that [employer] health insurance has been denying all routine footcare (nail/callus) services.   Awanda CHARM Imperial, DPM, FACFAS [facility]     [address] [place], [place] [address]                Office [phone]  Fax [phone]

## 2023-12-28 ENCOUNTER — Ambulatory Visit: Payer: Medicare Other

## 2024-02-07 NOTE — Progress Notes (Signed)
 Remote PPM Transmission

## 2024-02-16 ENCOUNTER — Ambulatory Visit: Payer: Medicare Other

## 2024-02-21 ENCOUNTER — Ambulatory Visit

## 2024-02-21 DIAGNOSIS — I442 Atrioventricular block, complete: Secondary | ICD-10-CM | POA: Diagnosis not present

## 2024-02-21 LAB — OPHTHALMOLOGY REPORT-SCANNED

## 2024-02-23 LAB — CUP PACEART REMOTE DEVICE CHECK
Battery Impedance: 3114 Ohm
Battery Remaining Longevity: 20 mo
Battery Voltage: 2.72 V
Brady Statistic AP VP Percent: 36 %
Brady Statistic AP VS Percent: 0 %
Brady Statistic AS VP Percent: 64 %
Brady Statistic AS VS Percent: 0 %
Date Time Interrogation Session: 20251021111833
Implantable Lead Connection Status: 753985
Implantable Lead Connection Status: 753985
Implantable Lead Implant Date: 20140331
Implantable Lead Implant Date: 20140331
Implantable Lead Location: 753859
Implantable Lead Location: 753860
Implantable Lead Model: 5076
Implantable Lead Model: 5076
Implantable Pulse Generator Implant Date: 20140331
Lead Channel Impedance Value: 517 Ohm
Lead Channel Impedance Value: 528 Ohm
Lead Channel Pacing Threshold Amplitude: 0.5 V
Lead Channel Pacing Threshold Amplitude: 1.125 V
Lead Channel Pacing Threshold Pulse Width: 0.4 ms
Lead Channel Pacing Threshold Pulse Width: 0.4 ms
Lead Channel Setting Pacing Amplitude: 2 V
Lead Channel Setting Pacing Amplitude: 2.5 V
Lead Channel Setting Pacing Pulse Width: 0.4 ms
Lead Channel Setting Sensing Sensitivity: 2.8 mV
Zone Setting Status: 755011
Zone Setting Status: 755011

## 2024-02-24 NOTE — Progress Notes (Signed)
 Remote PPM Transmission

## 2024-02-25 ENCOUNTER — Ambulatory Visit: Payer: Self-pay | Admitting: Student in an Organized Health Care Education/Training Program

## 2024-02-28 ENCOUNTER — Ambulatory Visit: Payer: Medicare Other

## 2024-03-05 ENCOUNTER — Ambulatory Visit

## 2024-03-14 ENCOUNTER — Ambulatory Visit: Payer: Self-pay | Admitting: Cardiovascular Disease

## 2024-03-21 ENCOUNTER — Ambulatory Visit: Admitting: Family Medicine

## 2024-03-21 VITALS — BP 179/50 | HR 73 | Ht 64.0 in | Wt 134.6 lb

## 2024-03-21 DIAGNOSIS — I10 Essential (primary) hypertension: Secondary | ICD-10-CM

## 2024-03-21 DIAGNOSIS — K5900 Constipation, unspecified: Secondary | ICD-10-CM | POA: Diagnosis not present

## 2024-03-21 NOTE — Patient Instructions (Signed)
 Gradually add some fiber like miralax . Add it one teaspoon a day for 5 days, then increase 2 teaspoons a day and can continue increasing in this fashion until normal bowel movement pattern.

## 2024-03-23 NOTE — Progress Notes (Signed)
    CHIEF COMPLAINT / HPI:  1. Med refills 2. Vision worsening and optho trying new light therapy for her macular degeneration 3. Dizziness unchanged 4. Decreased hearing unchanged 5. HTN:No chest pain and no change in exercise tolerance Taking medications appropriately 6. Increased problems witgh constipation in last few weeks. Using exlax once or twice with some good resut    PERTINENT  PMH / PSH: I have reviewed the patient's medications, allergies, past medical and surgical history, smoking status and updated in the EMR as appropriate.   OBJECTIVE:  BP (!) 179/50   Pulse 73   Ht 5' 4 (1.626 m)   Wt 134 lb 9.6 oz (61.1 kg)   SpO2 98%   BMI 23.10 kg/m  Vital signs reviewed GENERALl: Well developed, well nourished, in no acute distress. CV RRR LUNGS: clear to auscultation bilaterally. No wheezes or rales. Normal respiratory effort ABDOMEN: soft with positive bowel sounds. No masses noted    ASSESSMENT / PLAN: Constipation: adding miralax  starting gradually and continue daily. Let me know if not back to normal in 2-4 weeks Continue current regimen for hypertension Pacemaker present and she is having no symptoms from CV system that seem concerning  No problem-specific Assessment & Plan notes found for this encounter.   Camie Mulch MD

## 2024-03-26 ENCOUNTER — Ambulatory Visit: Admitting: Podiatry

## 2024-03-28 ENCOUNTER — Ambulatory Visit: Payer: Medicare Other

## 2024-04-02 ENCOUNTER — Encounter: Payer: Self-pay | Admitting: Podiatry

## 2024-04-02 ENCOUNTER — Ambulatory Visit (INDEPENDENT_AMBULATORY_CARE_PROVIDER_SITE_OTHER): Admitting: Podiatry

## 2024-04-02 DIAGNOSIS — M79674 Pain in right toe(s): Secondary | ICD-10-CM

## 2024-04-02 DIAGNOSIS — B351 Tinea unguium: Secondary | ICD-10-CM

## 2024-04-02 DIAGNOSIS — M79675 Pain in left toe(s): Secondary | ICD-10-CM

## 2024-04-02 NOTE — Progress Notes (Signed)
    Subjective:  Patient ID: Shelley Black, female    DOB: 1930/10/24,  MRN: [id number]  Shelley Black presents to clinic today for:  Chief Complaint  Patient presents with   [facility]     RFC Non diabetic toenail trim and callus care.   Patient notes nails are thick, discolored, elongated and painful in shoegear when trying to ambulate.  The patient reviewed and signed a nurse, learning disability ABN for [facility] services with [employer] today.  PCP is Rosalynn Camie CROME, MD.  Past Medical History:  Diagnosis Date   Arthritis    Cardiac pacemaker Medtronic    Complete heart block (HCC)    Heart murmur    not seen by cardiologist   Hypertension    Past Surgical History:  Procedure Laterality Date   APPENDECTOMY     CATARACT EXTRACTION, BILATERAL     LEFT HEART CATHETERIZATION WITH CORONARY ANGIOGRAM N/A 07/29/2012   Procedure: LEFT HEART CATHETERIZATION WITH CORONARY ANGIOGRAM;  Surgeon: Peter M Jordan, MD;  Location: [facility] CATH LAB;  Service: Cardiovascular;  Laterality: N/A;   PERMANENT PACEMAKER INSERTION N/A 07/31/2012   Procedure: PERMANENT PACEMAKER INSERTION;  Surgeon: Elspeth JAYSON Sage, MD;  Location: [facility] CATH LAB;  Service: Cardiovascular;  Laterality: N/A;   ROTATOR CUFF REPAIR Right    TEMPORARY PACEMAKER INSERTION  07/29/2012   Procedure: TEMPORARY PACEMAKER INSERTION;  Surgeon: Peter M Jordan, MD;  Location: [facility] CATH LAB;  Service: Cardiovascular;;   THYROID  SURGERY     TOTAL KNEE ARTHROPLASTY Left    TOTAL KNEE ARTHROPLASTY  12/24/2011   Procedure: TOTAL KNEE ARTHROPLASTY;  Surgeon: Norleen CROME Gavel, MD;  Location: [facility] OR;  Service: Orthopedics;  Laterality: Right;  general anesthesia with pre op femoral nerve block   No Known Allergies  Review of Systems: Negative except as noted in the HPI.  Objective:  Shelley Black is a pleasant 89 y.o. female in NAD. AAO x 3.  Vascular Examination: Capillary refill time is 3-5 seconds to toes bilateral. Palpable pedal  pulses b/l LE. Digital hair present b/l.  Skin temperature gradient WNL b/l. No varicosities b/l. No cyanosis noted b/l.   Dermatological Examination: Pedal skin with normal turgor, texture and tone b/l. No open wounds. No interdigital macerations b/l. Toenails x10 are 3mm thick, discolored, dystrophic with subungual debris. There is pain with compression of the nail plates.  They are elongated x10   Assessment/Plan: 1. Pain due to onychomycosis of toenails of both feet    The mycotic toenails were sharply debrided x10 with sterile nail nippers and a power debriding burr to decrease bulk/thickness and length.    Return in about 3 months (around 07/01/2024) for RFC.  [name], learning disability ABN for devoted insurance in 2026 for any RFC services as per our billing office instructions.   Awanda CHARM Imperial, DPM, FACFAS [facility]     [address] [place], [place] [address]                Office [phone]  Fax [phone]

## 2024-04-06 ENCOUNTER — Encounter: Admitting: Student in an Organized Health Care Education/Training Program

## 2024-04-13 ENCOUNTER — Other Ambulatory Visit: Payer: Self-pay

## 2024-04-13 ENCOUNTER — Encounter (HOSPITAL_BASED_OUTPATIENT_CLINIC_OR_DEPARTMENT_OTHER): Payer: Self-pay | Admitting: Emergency Medicine

## 2024-04-13 ENCOUNTER — Emergency Department (HOSPITAL_BASED_OUTPATIENT_CLINIC_OR_DEPARTMENT_OTHER)

## 2024-04-13 ENCOUNTER — Emergency Department (HOSPITAL_BASED_OUTPATIENT_CLINIC_OR_DEPARTMENT_OTHER)
Admission: EM | Admit: 2024-04-13 | Discharge: 2024-04-13 | Disposition: A | Discharge status: Home / Self Care | Attending: Emergency Medicine | Admitting: Emergency Medicine

## 2024-04-13 DIAGNOSIS — I10 Essential (primary) hypertension: Secondary | ICD-10-CM | POA: Insufficient documentation

## 2024-04-13 DIAGNOSIS — Z7982 Long term (current) use of aspirin: Secondary | ICD-10-CM | POA: Insufficient documentation

## 2024-04-13 DIAGNOSIS — Z955 Presence of coronary angioplasty implant and graft: Secondary | ICD-10-CM | POA: Insufficient documentation

## 2024-04-13 DIAGNOSIS — Z85828 Personal history of other malignant neoplasm of skin: Secondary | ICD-10-CM | POA: Insufficient documentation

## 2024-04-13 DIAGNOSIS — Z950 Presence of cardiac pacemaker: Secondary | ICD-10-CM | POA: Insufficient documentation

## 2024-04-13 DIAGNOSIS — Z85038 Personal history of other malignant neoplasm of large intestine: Secondary | ICD-10-CM | POA: Insufficient documentation

## 2024-04-13 DIAGNOSIS — W19XXXA Unspecified fall, initial encounter: Secondary | ICD-10-CM | POA: Insufficient documentation

## 2024-04-13 DIAGNOSIS — S52615A Nondisplaced fracture of left ulna styloid process, initial encounter for closed fracture: Secondary | ICD-10-CM | POA: Insufficient documentation

## 2024-04-13 DIAGNOSIS — S52515A Nondisplaced fracture of left radial styloid process, initial encounter for closed fracture: Secondary | ICD-10-CM | POA: Insufficient documentation

## 2024-04-13 DIAGNOSIS — Z79899 Other long term (current) drug therapy: Secondary | ICD-10-CM | POA: Insufficient documentation

## 2024-04-13 DIAGNOSIS — Z96652 Presence of left artificial knee joint: Secondary | ICD-10-CM | POA: Insufficient documentation

## 2024-04-13 NOTE — ED Provider Notes (Signed)
 [facility] Provider Note  CSN: [id number] Arrival date & time: 04/13/24 1249  Chief Complaint(s) Fall  HPI Shelley Black is a 89 y.o. female with past medical history as below, significant for CHB s/p PPM, htn, arthritis, ICH who presents to the ED with complaint of fall/wrist injury  Pt here w/ family Report of a trip/fall last pm, GLF, caught herself with left forearm. Has pain to left wrist, swelling noted. No numbness/weakness/tingling. No pain to ipsilateral elbow or shoulder. Pain reportedly quite mild. Not on AC, no head injury. Has been ambulatory since then  Past Medical History Past Medical History:  Diagnosis Date   Arthritis    Cardiac pacemaker Medtronic    Complete heart block (HCC)    Heart murmur    not seen by cardiologist   Hypertension    Patient Active Problem List   Diagnosis Date Noted   Well adult health check 12/09/2022   PAC (premature atrial contraction) 02/02/2022   Basal cell carcinoma (BCC) 12/02/2021   Orthostatic hypertension 11/04/2020   ICH (intracerebral hemorrhage) (HCC) 07/02/2020   Dizziness and giddiness 02/24/2019   Double vision 02/24/2019   Visual changes 01/13/2018   Focal stenosis LEFT vertebral artery 11/30/2017   Cardiac pacemaker Medtronic    VT (ventricular tachycardia) (HCC)polymorphic 07/29/2012   Complete heart block (HCC) 07/29/2012   Cardiac arrest (HCC) 07/29/2012   Rotator cuff tear 06/21/2012   S/P TKR (total knee replacement) 12/24/2011   INSOMNIA 07/16/2009   OVARIAN CYSTIC MASS 01/31/2009   CAROTID ARTERY DISEASE 06/21/2007   Benign neoplasm of colon 10/31/2006   Hyperlipidemia 10/31/2006   Essential hypertension 10/31/2006   Home Medication(s) Prior to Admission medications  Medication Sig Start Date End Date Taking? Authorizing Provider  amLODipine  (NORVASC ) 5 MG tablet TAKE 1 AND 1/2 TABLETS(7.5 MG) BY MOUTH DAILY 05/12/23   Fernande Elspeth BROCKS, MD   aspirin  81 MG EC tablet Take 1 tablet by mouth daily. 10/29/20   [provider]  Cholecalciferol  (VITAMIN D3) 2000 UNITS TABS Take 2,000 Units by mouth daily.     [provider]  Multiple Vitamin (MULTIVITAMIN WITH MINERALS) TABS Take 1 tablet by mouth daily.    [provider]  Omega-3 Fatty Acids (FISH OIL TRIPLE STRENGTH PO) Take 1 capsule by mouth daily.    [provider]  pravastatin  (PRAVACHOL ) 40 MG tablet TAKE 1 TABLET(40 MG) BY MOUTH DAILY 03/11/23   Rosalynn Camie CROME, MD  QUEtiapine  (SEROQUEL ) 25 MG tablet TAKE 1 TABLET(25 MG) BY MOUTH AT BEDTIME 12/19/23   Rosalynn Camie CROME, MD                                                                                                                                    Past Surgical History Past Surgical History:  Procedure Laterality Date   APPENDECTOMY     CATARACT EXTRACTION, BILATERAL  LEFT HEART CATHETERIZATION WITH CORONARY ANGIOGRAM N/A 07/29/2012   Procedure: LEFT HEART CATHETERIZATION WITH CORONARY ANGIOGRAM;  Surgeon: Peter M Jordan, MD;  Location: [facility] CATH LAB;  Service: Cardiovascular;  Laterality: N/A;   PERMANENT PACEMAKER INSERTION N/A 07/31/2012   Procedure: PERMANENT PACEMAKER INSERTION;  Surgeon: Elspeth JAYSON Sage, MD;  Location: [facility] CATH LAB;  Service: Cardiovascular;  Laterality: N/A;   ROTATOR CUFF REPAIR Right    TEMPORARY PACEMAKER INSERTION  07/29/2012   Procedure: TEMPORARY PACEMAKER INSERTION;  Surgeon: Peter M Jordan, MD;  Location: [facility] CATH LAB;  Service: Cardiovascular;;   THYROID  SURGERY     TOTAL KNEE ARTHROPLASTY Left    TOTAL KNEE ARTHROPLASTY  12/24/2011   Procedure: TOTAL KNEE ARTHROPLASTY;  Surgeon: Norleen LITTIE Gavel, MD;  Location: [facility] OR;  Service: Orthopedics;  Laterality: Right;  general anesthesia with pre op femoral nerve block   Family History Family History  Problem Relation Age of Onset   Heart attack Mother    Heart attack Father    Other Son        MVA   Diabetes Neg Hx     Cancer Neg Hx     Social History Social History[1] Allergies Patient has no known allergies.  Review of Systems A thorough review of systems was obtained and all systems are negative except as noted in the HPI and PMH.   Physical Exam Vital Signs  I have reviewed the triage vital signs BP (!) 165/50 (BP Location: Right Arm)   Pulse 74   Temp (!) 97.5 F (36.4 C) (Oral)   Resp 16   Ht 5' 4 (1.626 m)   Wt 61.1 kg   SpO2 99%   BMI 23.12 kg/m  Physical Exam Vitals and nursing note reviewed.  Constitutional:      General: She is not in acute distress.    Appearance: Normal appearance. She is well-developed. She is not ill-appearing.  HENT:     Head: Normocephalic and atraumatic.     Right Ear: External ear normal.     Left Ear: External ear normal.     Nose: Nose normal.     Mouth/Throat:     Mouth: Mucous membranes are moist.  Eyes:     General: No scleral icterus.       Right eye: No discharge.        Left eye: No discharge.  Cardiovascular:     Rate and Rhythm: Normal rate.  Pulmonary:     Effort: Pulmonary effort is normal. No respiratory distress.     Breath sounds: No stridor.  Abdominal:     General: Abdomen is flat. There is no distension.     Tenderness: There is no guarding.  Musculoskeletal:        General: No deformity.       Hands:     Cervical back: No rigidity.     Comments: LUE NVI Cap refill is brisk   Skin:    General: Skin is warm and dry.     Coloration: Skin is not cyanotic, jaundiced or pale.  Neurological:     Mental Status: She is alert.  Psychiatric:        Speech: Speech normal.        Behavior: Behavior normal. Behavior is cooperative.     ED Results and Treatments Labs (all labs ordered are listed, but only abnormal results are displayed) Labs Reviewed - No data to display  Radiology DG Wrist Complete  Left Result Date: 04/13/2024 CLINICAL DATA:  Fall.  Bruising and swelling. EXAM: LEFT WRIST - COMPLETE 3+ VIEW COMPARISON:  None Available. FINDINGS: Probable subtle nondisplaced fracture of the ulnar styloid. Question subtle linear lucency through the radial styloid. Cannot exclude a nondisplaced fracture of the radial styloid. There is chronic widening of the scapholunate distance. Advanced osteoarthritis of the first [facility] joint evident. IMPRESSION: 1. Probable subtle nondisplaced fracture of the ulnar styloid. 2. Question subtle linear lucency through the radial styloid. Cannot exclude a nondisplaced fracture of the radial styloid. 3. Chronic widening of the scapholunate distance. Electronically Signed   By: Camellia Candle M.D.   On: 04/13/2024 13:41    Pertinent labs & imaging results that were available during my care of the patient were reviewed by me and considered in my medical decision making (see MDM for details).  Medications Ordered in ED Medications - No data to display                                                                                                                                   Procedures Procedures  (including critical care time)  Medical Decision Making / ED Course    Medical Decision Making:    Deliyah Muckle is a 89 y.o. female with past medical history as below, significant for CHB s/p PPM, htn, arthritis, ICH who presents to the ED with complaint of fall/wrist injury. The complaint involves an extensive differential diagnosis and also carries with it a high risk of complications and morbidity.  Serious etiology was considered. Ddx includes but is not limited to: fx, dislocation, sprain, strain, contusion, etc  Complete initial physical exam performed, notably the patient was in NAD.    Reviewed and confirmed nursing documentation for past medical history, family history, social history.  Vital signs reviewed.    FOOSH  Wrist fx > - pt with  GLF, last pm, no head injury or LOC, has been ambulatory since then - RHD - wrist XR concerning for possible ulnar /radial styloid fx, non displaced - will place in splint/sling, have pt f/u w/ ortho  3:30 PM:  I have discussed the diagnosis/risks/treatment options with the patient and family.  Evaluation and diagnostic testing in the emergency department does not suggest an emergent condition requiring admission or immediate intervention beyond what has been performed at this time.  They will follow up with PCP/ortho. We also discussed returning to the ED immediately if new or worsening sx occur. We discussed the sx which are most concerning (e.g., sudden worsening pain, fever, inability to tolerate by mouth) that necessitate immediate return.    The patient appears reasonably screened and/or stabilized for discharge and I doubt any other medical condition or other [facility] requiring further screening, evaluation, or treatment in the ED at this time prior to discharge.  Additional history obtained: -Additional history obtained from family -External records from outside source obtained and reviewed including: Chart review including previous notes, labs, imaging, consultation notes including  Home meds   Lab Tests: na  EKG   EKG Interpretation Date/Time:    Ventricular Rate:    PR Interval:    QRS Duration:    QT Interval:    QTC Calculation:   R Axis:      Text Interpretation:           Imaging Studies ordered: I ordered imaging studies including wrist XR I independently visualized the following imaging with scope of interpretation limited to determining acute life threatening conditions related to emergency care; findings noted above I agree with the radiologist interpretation If any imaging was obtained with contrast I closely monitored patient for any possible adverse reaction a/w contrast administration in the emergency  department   Medicines ordered and prescription drug management: No orders of the defined types were placed in this encounter.   -I have reviewed the patients home medicines and have made adjustments as needed   Consultations Obtained: na   Cardiac Monitoring: Continuous pulse oximetry interpreted by myself, 99% on RA.    Social Determinants of Health:  Diagnosis or treatment significantly limited by social determinants of health: na   Reevaluation: After the interventions noted above, I reevaluated the patient and found that they have improved  Co morbidities that complicate the patient evaluation  Past Medical History:  Diagnosis Date   Arthritis    Cardiac pacemaker Medtronic    Complete heart block (HCC)    Heart murmur    not seen by cardiologist   Hypertension       Dispostion: Disposition decision including need for hospitalization was considered, and patient discharged from emergency department.    Final Clinical Impression(s) / ED Diagnoses Final diagnoses:  Closed nondisplaced fracture of styloid process of left ulna, initial encounter  Closed nondisplaced fracture of styloid process of left radius, initial encounter         [1]  Social History Tobacco Use   Smoking status: Never    Passive exposure: Never   Smokeless tobacco: Never  Substance Use Topics   Alcohol use: No   Drug use: No     Elnor Jayson LABOR, DO 04/13/24 1530

## 2024-04-13 NOTE — Discharge Instructions (Signed)
 It was a pleasure caring for you today in the emergency department.  Please call Dr Rojean office to arrange for follow up in the next 2 weeks  You can take over the counter acetaminophen  or ibuprofen as needed for discomfort  You can take brace off to shower   Please return to the emergency department for any worsening or worrisome symptoms.

## 2024-04-13 NOTE — ED Triage Notes (Signed)
 Fell last night about 1130   denies bumping  her head , no thinners ,now  having left wrist pain has  good pulse, can wiggle fingers and she has < 3 sec cap refill some swelling

## 2024-05-17 ENCOUNTER — Ambulatory Visit: Payer: Medicare Other

## 2024-05-22 ENCOUNTER — Ambulatory Visit

## 2024-05-22 ENCOUNTER — Encounter: Payer: Self-pay | Admitting: Student in an Organized Health Care Education/Training Program

## 2024-05-22 ENCOUNTER — Ambulatory Visit: Admitting: Student in an Organized Health Care Education/Training Program

## 2024-05-22 ENCOUNTER — Telehealth: Payer: Self-pay | Admitting: *Deleted

## 2024-05-22 VITALS — BP 110/70 | HR 72 | Ht 64.0 in | Wt 135.1 lb

## 2024-05-22 DIAGNOSIS — I442 Atrioventricular block, complete: Secondary | ICD-10-CM

## 2024-05-22 DIAGNOSIS — I491 Atrial premature depolarization: Secondary | ICD-10-CM

## 2024-05-22 DIAGNOSIS — I472 Ventricular tachycardia, unspecified: Secondary | ICD-10-CM

## 2024-05-22 NOTE — Progress Notes (Signed)
 " Cardiology Office Note   Date:  05/22/2024  ID:  Shelley Black, Shelley Black 09-09-1930, MRN [id number] PCP: Rosalynn Camie CROME, MD  [facility] Providers Cardiologist:  None Electrophysiologist:  Donnice DELENA Primus, MD    History of Present Illness Shelley Black is a 89 y.o. female with symptomatic bradycardia s/p LEFT MDT DC PPM, prior pause dependent PMVT, HTN and HLD who presents for device management.   Discussed the use of AI scribe software for clinical note transcription with the patient, who gave verbal consent to proceed. History of Present Illness Shelley Black is a 89 year old female with a pacemaker who presents with dizziness and for evaluation of her pacemaker.  She has had a pacemaker for ten years, which is nearing the end of its battery life. She has not experienced any issues with the device. Historically, the pacemaker settings have been set to a tracking rate of ninety beats per minute.  She has been experiencing dizziness for several years, which has persisted despite medication adjustments. She also has macular degeneration and is considering laser treatment for it, although she is unsure if it is related to her dizziness.  Her current medication regimen was not detailed during the conversation.  ROS: none  Studies Reviewed  ECG review 05/22/24: ASVP 72, PR 188, QRS 168, QT/c 452/494  TTE  Result date: 07/03/20  1. Left ventricular ejection fraction, by estimation, is 50 to 55%. The  left ventricle has low normal function. Abnormal (paradoxical) septal  motion, consistent with RV pacemaker. Otherwise, no regional wall motion  abnormalities. There is moderate  hypertrophy of the basal septum. The rest of the LV segments demonstrate  mild hypertrophy. Left ventricular diastolic parameters are consistent  with Grade I diastolic dysfunction (impaired relaxation).   2. Right ventricular systolic function is normal. The right ventricular   size is normal. There is mildly elevated pulmonary artery systolic  pressure. The estimated right ventricular systolic pressure is 36.4 mmHg.   3. Left atrial size was mildly dilated.   4. There is mild thickening of the mitral valve leaflet(s). There is mild  calcification of the mitral valve leaflet(s). Moderate mitral annular  calcification. Trivial mitral valve regurgitation.   5. The aortic valve is tricuspid. There is mild calcification of the  aortic valve. There is mild thickening of the aortic valve. Aortic valve  regurgitation is not visualized. Mild to moderate aortic valve  sclerosis/calcification is present, without any  evidence of aortic stenosis.   6. The inferior vena cava is normal in size with <50% respiratory  variability, suggesting right atrial pressure of 8 mmHg.   Physical Exam VS:  BP 110/70 (BP Location: Right Arm, Patient Position: Sitting, Cuff Size: Normal)   Pulse 72   Ht 5' 4 (1.626 m)   Wt 135 lb 1.6 oz (61.3 kg)   SpO2 95%   BMI 23.19 kg/m   Wt Readings from Last 3 Encounters:  05/22/24 135 lb 1.6 oz (61.3 kg)  04/13/24 134 lb 11.2 oz (61.1 kg)  03/21/24 134 lb 9.6 oz (61.1 kg)    GEN: Well nourished, well developed in no acute distress NECK: No JVD; No carotid bruits CARDIAC: RRR, no murmurs, rubs, gallops RESPIRATORY:  Clear to auscultation without rales, wheezing or rhonchi  ABDOMEN: Soft, non-tender, non-distended EXTREMITIES:  No edema; No deformity  SKIN: LEFT infraclavicular incision well-healed   ASSESSMENT AND PLAN Shelley Black is a 89 y.o. female with symptomatic bradycardia s/p LEFT  MDT DC PPM, prior pause dependent PMVT, HTN and HLD who presents for device management.  Assessment & Plan Complete heart block with cardiac pacemaker Pacemaker nearing end of lifespan, but not yet at RRT. Dizziness unlikely related to pacemaker settings.  She is planning on moving to [place] with her daughter.  Unclear on the timing of this  but it may be before her PPM is at RRT in which case we will coordinate for device transfer to clinic in [place] . - Monitor pacemaker function monthly. - Coordinate with new provider if relocating for continued pacemaker management.  Dispo: RTC 1 yr and/or transfer to another device clinic if she moves prior to f/u. Ideally she would like to get generator replacement prior to moving but this may not work out with timing.  Will continue to monitor monthly remotes.  Signed, Donnice DELENA Primus, MD  "

## 2024-05-22 NOTE — Patient Instructions (Signed)

## 2024-05-22 NOTE — Telephone Encounter (Signed)
 Spoke with patient's daughter, Luke ([email or web address]). Explained that pt is actually due for a remote PPM transmission today, despite having an in-clinic check today. Kim verbalized understanding and stated she will send it tomorrow morning. Advised that we will begin monthly battery checks next month as requested. Kim expressed appreciation of call and agreement with plan.

## 2024-05-24 NOTE — Telephone Encounter (Signed)
 Pt daughter Kim(dpr) states that the monitor isnt working, tech support number was given and she will call when she is with the patient. She will call us  back and let us  know

## 2024-05-25 LAB — CUP PACEART INCLINIC DEVICE CHECK
Battery Impedance: 3371 Ohm
Battery Remaining Longevity: 17 mo
Battery Voltage: 2.71 V
Brady Statistic AP VP Percent: 35 %
Brady Statistic AP VS Percent: 0 %
Brady Statistic AS VP Percent: 65 %
Brady Statistic AS VS Percent: 0 %
Date Time Interrogation Session: 20260120172633
Implantable Lead Connection Status: 753985
Implantable Lead Connection Status: 753985
Implantable Lead Implant Date: 20140331
Implantable Lead Implant Date: 20140331
Implantable Lead Location: 753859
Implantable Lead Location: 753860
Implantable Lead Model: 5076
Implantable Lead Model: 5076
Implantable Pulse Generator Implant Date: 20140331
Lead Channel Impedance Value: 504 Ohm
Lead Channel Impedance Value: 515 Ohm
Lead Channel Pacing Threshold Amplitude: 1 V
Lead Channel Pacing Threshold Amplitude: 1.25 V
Lead Channel Pacing Threshold Pulse Width: 0.4 ms
Lead Channel Pacing Threshold Pulse Width: 0.4 ms
Lead Channel Sensing Intrinsic Amplitude: 2.8 mV
Lead Channel Setting Pacing Amplitude: 2 V
Lead Channel Setting Pacing Amplitude: 2.5 V
Lead Channel Setting Pacing Pulse Width: 0.4 ms
Lead Channel Setting Sensing Sensitivity: 2.8 mV
Zone Setting Status: 755011
Zone Setting Status: 755011

## 2024-05-25 NOTE — Telephone Encounter (Signed)
 Unable to reach [employer] and remote transmission has not been sent

## 2024-05-28 LAB — CUP PACEART REMOTE DEVICE CHECK
Battery Impedance: 3319 Ohm
Battery Remaining Longevity: 18 mo
Battery Voltage: 2.71 V
Brady Statistic AP VP Percent: 33 %
Brady Statistic AP VS Percent: 0 %
Brady Statistic AS VP Percent: 67 %
Brady Statistic AS VS Percent: 0 %
Date Time Interrogation Session: 20260123131314
Implantable Lead Connection Status: 753985
Implantable Lead Connection Status: 753985
Implantable Lead Implant Date: 20140331
Implantable Lead Implant Date: 20140331
Implantable Lead Location: 753859
Implantable Lead Location: 753860
Implantable Lead Model: 5076
Implantable Lead Model: 5076
Implantable Pulse Generator Implant Date: 20140331
Lead Channel Impedance Value: 475 Ohm
Lead Channel Impedance Value: 529 Ohm
Lead Channel Pacing Threshold Amplitude: 0.5 V
Lead Channel Pacing Threshold Amplitude: 1.25 V
Lead Channel Pacing Threshold Pulse Width: 0.4 ms
Lead Channel Pacing Threshold Pulse Width: 0.4 ms
Lead Channel Setting Pacing Amplitude: 2 V
Lead Channel Setting Pacing Amplitude: 2.5 V
Lead Channel Setting Pacing Pulse Width: 0.4 ms
Lead Channel Setting Sensing Sensitivity: 2.8 mV
Zone Setting Status: 755011
Zone Setting Status: 755011

## 2024-05-28 NOTE — Progress Notes (Signed)
 Remote PPM Transmission

## 2024-05-29 ENCOUNTER — Ambulatory Visit: Payer: Medicare Other

## 2024-06-03 ENCOUNTER — Ambulatory Visit: Payer: Self-pay | Admitting: Student in an Organized Health Care Education/Training Program

## 2024-06-04 ENCOUNTER — Ambulatory Visit

## 2024-06-22 ENCOUNTER — Ambulatory Visit

## 2024-07-02 ENCOUNTER — Ambulatory Visit: Admitting: Podiatry

## 2024-07-19 ENCOUNTER — Ambulatory Visit

## 2024-07-23 ENCOUNTER — Ambulatory Visit

## 2024-08-16 ENCOUNTER — Ambulatory Visit: Payer: Medicare Other

## 2024-08-21 ENCOUNTER — Ambulatory Visit

## 2024-08-23 ENCOUNTER — Ambulatory Visit

## 2024-09-23 ENCOUNTER — Ambulatory Visit

## 2024-10-24 ENCOUNTER — Ambulatory Visit

## 2024-11-15 ENCOUNTER — Ambulatory Visit: Payer: Medicare Other

## 2024-11-20 ENCOUNTER — Ambulatory Visit

## 2024-11-24 ENCOUNTER — Ambulatory Visit

## 2024-12-25 ENCOUNTER — Ambulatory Visit

## 2025-01-25 ENCOUNTER — Ambulatory Visit

## 2025-02-19 ENCOUNTER — Ambulatory Visit

## 2025-02-25 ENCOUNTER — Ambulatory Visit

## 2025-03-28 ENCOUNTER — Ambulatory Visit

## 2025-05-21 ENCOUNTER — Ambulatory Visit
